# Patient Record
Sex: Female | Born: 1935 | Race: White | Hispanic: No | Marital: Married | Smoking: Never smoker
Health system: Southern US, Community
[De-identification: ages and names within clinical notes are randomized; demographics above are authoritative.]

## PROBLEM LIST (undated history)

## (undated) DIAGNOSIS — C801 Malignant (primary) neoplasm, unspecified: Secondary | ICD-10-CM

## (undated) HISTORY — DX: Malignant (primary) neoplasm, unspecified: C80.1

## (undated) HISTORY — PX: COLON SURGERY: SHX602

## (undated) HISTORY — PX: BACK SURGERY: SHX140

## (undated) HISTORY — PX: HERNIA REPAIR: SHX51

## (undated) HISTORY — PX: OTHER SURGICAL HISTORY: SHX169

## (undated) HISTORY — PX: ACHILLES TENDON REPAIR: SUR1153

---

## 1989-06-15 HISTORY — PX: BLADDER SURGERY: SHX569

## 1993-10-30 HISTORY — PX: KNEE SURGERY: SHX244

## 1997-09-12 ENCOUNTER — Other Ambulatory Visit: Admission: RE | Admit: 1997-09-12 | Discharge: 1997-09-12 | Payer: Self-pay | Admitting: Obstetrics and Gynecology

## 1997-10-03 ENCOUNTER — Ambulatory Visit (HOSPITAL_COMMUNITY): Admission: RE | Admit: 1997-10-03 | Discharge: 1997-10-03 | Payer: Self-pay | Admitting: Obstetrics and Gynecology

## 1998-10-31 ENCOUNTER — Ambulatory Visit (HOSPITAL_COMMUNITY): Admission: RE | Admit: 1998-10-31 | Discharge: 1998-10-31 | Payer: Self-pay | Admitting: Obstetrics and Gynecology

## 1998-10-31 ENCOUNTER — Encounter: Payer: Self-pay | Admitting: Obstetrics and Gynecology

## 1999-11-03 ENCOUNTER — Encounter: Payer: Self-pay | Admitting: Obstetrics and Gynecology

## 1999-11-03 ENCOUNTER — Ambulatory Visit (HOSPITAL_COMMUNITY): Admission: RE | Admit: 1999-11-03 | Discharge: 1999-11-03 | Payer: Self-pay | Admitting: Obstetrics and Gynecology

## 2000-11-04 ENCOUNTER — Other Ambulatory Visit: Admission: RE | Admit: 2000-11-04 | Discharge: 2000-11-04 | Payer: Self-pay | Admitting: Obstetrics and Gynecology

## 2000-11-04 ENCOUNTER — Ambulatory Visit (HOSPITAL_COMMUNITY): Admission: RE | Admit: 2000-11-04 | Discharge: 2000-11-04 | Payer: Self-pay | Admitting: Obstetrics and Gynecology

## 2000-11-04 ENCOUNTER — Encounter: Payer: Self-pay | Admitting: Obstetrics and Gynecology

## 2002-01-17 ENCOUNTER — Encounter: Payer: Self-pay | Admitting: Obstetrics and Gynecology

## 2002-01-17 ENCOUNTER — Encounter: Admission: RE | Admit: 2002-01-17 | Discharge: 2002-01-17 | Payer: Self-pay | Admitting: Orthopedic Surgery

## 2002-01-17 ENCOUNTER — Encounter: Payer: Self-pay | Admitting: Orthopedic Surgery

## 2002-01-17 ENCOUNTER — Ambulatory Visit (HOSPITAL_COMMUNITY): Admission: RE | Admit: 2002-01-17 | Discharge: 2002-01-17 | Payer: Self-pay | Admitting: Obstetrics and Gynecology

## 2002-01-18 ENCOUNTER — Ambulatory Visit (HOSPITAL_BASED_OUTPATIENT_CLINIC_OR_DEPARTMENT_OTHER): Admission: RE | Admit: 2002-01-18 | Discharge: 2002-01-19 | Payer: Self-pay | Admitting: Orthopedic Surgery

## 2002-03-06 ENCOUNTER — Encounter: Admission: RE | Admit: 2002-03-06 | Discharge: 2002-06-04 | Payer: Self-pay | Admitting: Orthopedic Surgery

## 2002-06-05 ENCOUNTER — Encounter: Admission: RE | Admit: 2002-06-05 | Discharge: 2002-06-08 | Payer: Self-pay | Admitting: Orthopedic Surgery

## 2002-07-18 ENCOUNTER — Encounter: Admission: RE | Admit: 2002-07-18 | Discharge: 2002-07-18 | Payer: Self-pay | Admitting: Obstetrics and Gynecology

## 2002-07-18 ENCOUNTER — Encounter: Payer: Self-pay | Admitting: Obstetrics and Gynecology

## 2003-01-16 ENCOUNTER — Encounter: Admission: RE | Admit: 2003-01-16 | Discharge: 2003-01-16 | Payer: Self-pay | Admitting: Orthopedic Surgery

## 2003-01-17 ENCOUNTER — Ambulatory Visit (HOSPITAL_BASED_OUTPATIENT_CLINIC_OR_DEPARTMENT_OTHER): Admission: RE | Admit: 2003-01-17 | Discharge: 2003-01-17 | Payer: Self-pay | Admitting: Orthopedic Surgery

## 2003-01-17 ENCOUNTER — Ambulatory Visit (HOSPITAL_COMMUNITY): Admission: RE | Admit: 2003-01-17 | Discharge: 2003-01-17 | Payer: Self-pay | Admitting: Orthopedic Surgery

## 2003-02-07 ENCOUNTER — Encounter: Admission: RE | Admit: 2003-02-07 | Discharge: 2003-03-29 | Payer: Self-pay | Admitting: Orthopedic Surgery

## 2003-02-07 ENCOUNTER — Ambulatory Visit (HOSPITAL_COMMUNITY): Admission: RE | Admit: 2003-02-07 | Discharge: 2003-02-07 | Payer: Self-pay | Admitting: Obstetrics and Gynecology

## 2003-02-21 ENCOUNTER — Ambulatory Visit (HOSPITAL_BASED_OUTPATIENT_CLINIC_OR_DEPARTMENT_OTHER): Admission: RE | Admit: 2003-02-21 | Discharge: 2003-02-21 | Payer: Self-pay | Admitting: Orthopedic Surgery

## 2004-02-11 ENCOUNTER — Ambulatory Visit (HOSPITAL_COMMUNITY): Admission: RE | Admit: 2004-02-11 | Discharge: 2004-02-11 | Payer: Self-pay | Admitting: Obstetrics and Gynecology

## 2005-02-11 ENCOUNTER — Ambulatory Visit (HOSPITAL_COMMUNITY): Admission: RE | Admit: 2005-02-11 | Discharge: 2005-02-11 | Payer: Self-pay | Admitting: Obstetrics and Gynecology

## 2005-07-21 ENCOUNTER — Encounter: Admission: RE | Admit: 2005-07-21 | Discharge: 2005-07-21 | Payer: Self-pay | Admitting: Obstetrics & Gynecology

## 2005-08-27 ENCOUNTER — Encounter: Admission: RE | Admit: 2005-08-27 | Discharge: 2005-08-27 | Payer: Self-pay | Admitting: Obstetrics & Gynecology

## 2005-09-08 ENCOUNTER — Encounter (INDEPENDENT_AMBULATORY_CARE_PROVIDER_SITE_OTHER): Payer: Self-pay | Admitting: Specialist

## 2005-09-08 ENCOUNTER — Ambulatory Visit (HOSPITAL_COMMUNITY): Admission: RE | Admit: 2005-09-08 | Discharge: 2005-09-08 | Payer: Self-pay | Admitting: Gastroenterology

## 2006-02-14 ENCOUNTER — Ambulatory Visit (HOSPITAL_COMMUNITY): Admission: RE | Admit: 2006-02-14 | Discharge: 2006-02-14 | Payer: Self-pay | Admitting: Obstetrics & Gynecology

## 2006-09-05 ENCOUNTER — Encounter: Admission: RE | Admit: 2006-09-05 | Discharge: 2006-09-05 | Payer: Self-pay | Admitting: Internal Medicine

## 2006-12-21 ENCOUNTER — Ambulatory Visit (HOSPITAL_COMMUNITY): Admission: RE | Admit: 2006-12-21 | Discharge: 2006-12-21 | Payer: Self-pay | Admitting: Internal Medicine

## 2006-12-21 ENCOUNTER — Ambulatory Visit: Payer: Self-pay | Admitting: Internal Medicine

## 2006-12-21 LAB — CONVERTED CEMR LAB
BUN: 13 mg/dL (ref 6–23)
Basophils Absolute: 0 10*3/uL (ref 0.0–0.1)
Creatinine, Ser: 0.6 mg/dL (ref 0.4–1.2)
GFR calc Af Amer: 123 mL/min
GFR calc non Af Amer: 102 mL/min
Hemoglobin: 14.9 g/dL (ref 12.0–15.0)
MCHC: 34.3 g/dL (ref 30.0–36.0)
Monocytes Absolute: 0.6 10*3/uL (ref 0.2–0.7)
Monocytes Relative: 7.3 % (ref 3.0–11.0)
Potassium: 4.2 meq/L (ref 3.5–5.1)
Pro B Natriuretic peptide (BNP): 791 pg/mL — ABNORMAL HIGH (ref 0.0–100.0)
RDW: 13.5 % (ref 11.5–14.6)

## 2006-12-22 ENCOUNTER — Ambulatory Visit: Payer: Self-pay

## 2006-12-28 ENCOUNTER — Ambulatory Visit: Payer: Self-pay | Admitting: Internal Medicine

## 2007-01-06 ENCOUNTER — Ambulatory Visit: Payer: Self-pay | Admitting: Internal Medicine

## 2007-01-30 ENCOUNTER — Observation Stay (HOSPITAL_COMMUNITY): Admission: RE | Admit: 2007-01-30 | Discharge: 2007-01-30 | Payer: Self-pay | Admitting: Interventional Cardiology

## 2007-02-22 DIAGNOSIS — J40 Bronchitis, not specified as acute or chronic: Secondary | ICD-10-CM | POA: Insufficient documentation

## 2007-02-22 DIAGNOSIS — G4730 Sleep apnea, unspecified: Secondary | ICD-10-CM | POA: Insufficient documentation

## 2007-02-22 DIAGNOSIS — R0602 Shortness of breath: Secondary | ICD-10-CM | POA: Insufficient documentation

## 2007-02-22 DIAGNOSIS — I82409 Acute embolism and thrombosis of unspecified deep veins of unspecified lower extremity: Secondary | ICD-10-CM | POA: Insufficient documentation

## 2007-02-23 ENCOUNTER — Ambulatory Visit: Payer: Self-pay | Admitting: Internal Medicine

## 2007-03-08 ENCOUNTER — Ambulatory Visit (HOSPITAL_COMMUNITY): Admission: RE | Admit: 2007-03-08 | Discharge: 2007-03-08 | Payer: Self-pay | Admitting: Obstetrics & Gynecology

## 2007-06-06 ENCOUNTER — Encounter: Admission: RE | Admit: 2007-06-06 | Discharge: 2007-07-21 | Payer: Self-pay | Admitting: Orthopaedic Surgery

## 2008-04-03 ENCOUNTER — Ambulatory Visit (HOSPITAL_COMMUNITY): Admission: RE | Admit: 2008-04-03 | Discharge: 2008-04-03 | Payer: Self-pay | Admitting: Obstetrics & Gynecology

## 2009-04-04 ENCOUNTER — Ambulatory Visit (HOSPITAL_COMMUNITY): Admission: RE | Admit: 2009-04-04 | Discharge: 2009-04-04 | Payer: Self-pay | Admitting: Obstetrics & Gynecology

## 2009-06-09 ENCOUNTER — Ambulatory Visit (HOSPITAL_COMMUNITY): Admission: RE | Admit: 2009-06-09 | Discharge: 2009-06-10 | Payer: Self-pay | Admitting: Surgery

## 2010-04-23 ENCOUNTER — Other Ambulatory Visit: Payer: Self-pay | Admitting: Obstetrics & Gynecology

## 2010-04-23 DIAGNOSIS — Z1231 Encounter for screening mammogram for malignant neoplasm of breast: Secondary | ICD-10-CM

## 2010-05-01 ENCOUNTER — Ambulatory Visit (HOSPITAL_COMMUNITY)
Admission: RE | Admit: 2010-05-01 | Discharge: 2010-05-01 | Disposition: A | Discharge status: Home / Self Care | Payer: Medicare Other | Source: Ambulatory Visit | Attending: Obstetrics & Gynecology | Admitting: Obstetrics & Gynecology

## 2010-05-01 DIAGNOSIS — Z1231 Encounter for screening mammogram for malignant neoplasm of breast: Secondary | ICD-10-CM | POA: Insufficient documentation

## 2010-05-05 LAB — CBC
HCT: 41.7 % (ref 36.0–46.0)
Hemoglobin: 14.3 g/dL (ref 12.0–15.0)
MCHC: 34.3 g/dL (ref 30.0–36.0)
MCV: 93.8 fL (ref 78.0–100.0)
Platelets: 194 10*3/uL (ref 150–400)
RDW: 14.2 % (ref 11.5–15.5)

## 2010-05-05 LAB — GLUCOSE, CAPILLARY
Glucose-Capillary: 108 mg/dL — ABNORMAL HIGH (ref 70–99)
Glucose-Capillary: 111 mg/dL — ABNORMAL HIGH (ref 70–99)
Glucose-Capillary: 118 mg/dL — ABNORMAL HIGH (ref 70–99)
Glucose-Capillary: 130 mg/dL — ABNORMAL HIGH (ref 70–99)

## 2010-05-05 LAB — BASIC METABOLIC PANEL
BUN: 21 mg/dL (ref 6–23)
GFR calc Af Amer: >60 mL/min (ref >60)
Glucose, Bld: 112 mg/dL — ABNORMAL HIGH (ref 70–99)

## 2010-06-26 ENCOUNTER — Other Ambulatory Visit: Payer: Self-pay | Admitting: Surgery

## 2010-06-30 NOTE — Assessment & Plan Note (Signed)
[facility]                             PULMONARY OFFICE NOTE   NAME:Garza, HAILEE HOLLICK                     MRN:          [id number]  DATE:12/28/2006                            DOB:          21-May-1922    PROBLEMS:  1. Dyspnea with history of deep vein thrombosis.  2. Sleep apnea.  3. Bronchitis.  4. History of deep vein thrombosis/Coumadin/left knee surgery.   HISTORY:  Immediate concern when she came for first visit previously was  that her dyspnea might be from pulmonary embolism. Fortunately, CT scan,  somewhat limited for small vessels, was negative for any central or low-  bar embolism, but it did show cardiac enlargement and suggested  pulmonary arterial hypertension. Her blood work was significant for a b-  naturetic peptide elevated at 791. Glucose was 150, BUN 13, creatinine  0.6, hemoglobin 14.9. I had shared the results with Dr. Kevan Ny and he was  going to follow up the cardiac status. She says that feels better, less  dyspneic. She mentions an occasional twinge in her left flank. She has  been put on Furosemide 20 mg and potassium 20 mEq, and she is pending an  echocardiogram. She says that she had been drinking Gatorade instead of  Coke, and I suggested that she drink water and get away from the sodium  loading. She mentions that she lost 7 pounds since last year. Note that  she has had flu vaccine and she had pneumococcal vaccine three years  ago.   OBJECTIVE:  Weight 241 pounds, blood pressure 110/90, pulse 108, room  air saturation 95%. She is alert, obese. Pulse is regular with frequent  extra systoles. I do not hear a rub. Breath sounds are quiet without  dullness or wheeze. There is no peripheral edema now.   IMPRESSION:  Dyspnea improved with diuresis. Main active problem was  probably borderline heart failure with fluid retention aggravated by her  Gatorade. Need to watch heart rhythm. She will continue BiPAP for her  sleep  apnea. Note also by telephone report, Dopplers of leg veins were  negative.   PLAN:  Drink water, not Gatorade. Schedule PFT. Keep follow up with Dr.  Kevan Ny for cardiac evaluation. Schedule return to me in two months and  then p.r.n. after we look at her PFT.     Clinton D. Maple Hudson, MD, Tonny Bollman, FACP  Electronically Signed    CDY/MedQ  DD: 01/01/2007  DT: 01/02/2007  Job #: [id number]   cc:   Candyce Churn, M.D.  Lum Keas, MD

## 2010-06-30 NOTE — Assessment & Plan Note (Signed)
[facility]                             PULMONARY OFFICE NOTE   NAME:[name], [name]                     MRN:          [id number]  DATE:12/21/2006                            DOB:          Aug 22, 1922    REFERRING PHYSICIAN:  M. Leda Quail, MD   PULMONARY CONSULTATION   PROBLEM:  Pulmonary consultation at the kind request of Dr. Hyacinth Meeker for  this 77 year old woman complaining of shortness of breath.   HISTORY:  For at least three or four weeks she has been aware of  increased shortness of breath without any sudden event.  Onset appears  to have been gradual and it is not clear that it is progressive.  There  has been no associated chest pain or palpitations.  Other than  occasional cough and sometimes choking a little if she is not careful  while eating, there has not been sustained wheeze, cough or phlegm.  She  was noted to be dyspneic when she went to her GYN visit last week and  was referred here.  Her primary physician, Dr. Kevan Ny, had apparently  raised question of asthma in the past.  She says she had a negative  work up for asthma three to four years ago in [place].  Dyspnea is  primarily noted with activity.  She has been using Bi-PAP at unknown  pressure for 5 or 6 years, originally prescribed in [place] for sleep  apnea.  She says she sleeps comfortably while she wears that although  there is some discomfort associated with mask fit.  Her left leg has  felt funny for the past two days.   MEDICATIONS:  1. Bi-PAP at night for sleep.  2. Effexor XR 75 mg.  3. Nasacort AQ.  4. Singulair.   ALLERGIES:  DRUG INTOLERANT TO CODEINE.   REVIEW OF SYSTEMS:  Exertional dyspnea.  Some productive cough is  indicated on the questionnaire but not in direct questioning.  Indigestion.  A 50 pound weight gain over the last 10 years.  Anxiety.  Joint stiffness.  Left lower leg cramps.  She does not recognize reflux.  She has had some nonspecific  nonexertional, non-meal related pains  across her back at the level of her shoulder blades occasionally in the  last two to three weeks.   PAST MEDICAL/SURGICAL HISTORY:  1. Sleep apnea.  2. Question of asthma 3 to 4 years ago.  3. Whooping cough at age 75 weeks.  4. Bronchitis.  5. ALLERGIC TO MAN[place] POLLEN in the spring when she lived in      [place] experiencing bronchitis with this in the springtime's.  6. Pneumonia at least once.  7. History of deep venous thrombosis after left knee surgery years      ago, treated with Coumadin.  She does not know that she had any      pulmonary emboli at that time.  8. Spine surgery in 2000.  9. Bilateral knee replacement.  10.Achilles surgery in 2003 at left ankle.  11.No history of bleeding disorder, heart disease or cancer.  Not  known to be diabetic.   SOCIAL HISTORY:  Never smoked.  Social alcohol.  Married with children.   FAMILY HISTORY:  Mother died of old age.  Father died in an accident.  A  brother died at 34 with colon cancer.  Sister died with a stroke at age  72.  Nobody known to have lung disease.   OBJECTIVE:  VITAL SIGNS:  Weight 248 pounds.  Blood pressure 116/62,  pulse 107, room air saturation 93%.  GENERAL APPEARANCE:  Pleasant, significantly overweight, elderly woman,  not in acute distress.  HEENT:  Nasal airway clear.  Nailbed's pink.  NECK:  No neck vein distention or stridor.  LUNG:  Lung fields quiet.  I could not appreciate dullness, rub, rales  or wheeze and she was not coughing or labored sitting in the exam room.  HEART:  Heart sounds were regular.  I did not hear murmur or gallop.  EXTREMITIES:  There was 1+ edema bilaterally below the knees without  palpable cords.  Homan's negative bilaterally.   IMPRESSION:  1. Dyspnea with history of deep venous thrombosis and left leg feels      funny.  The most urgent issue to exclude is pulmonary embolism.      We will attempt to rule that out tonight.  2.  I don't appreciate that there is a definite new pulmonary process      going on and would consider cardiac disease to be the most      important second issue in the differential that needs to be      evaluated.   PLAN:  1. CT scan of the chest to rule out pulmonary embolism.  2. Schedule Doppler vein evaluation, leg veins, bilaterally.  3. Blood for B-naturetic peptide, CBC, and BMET.  4. Schedule return in one week, earlier p.r.n.   ADDENDUM dated December 22, 2006.  As of 8:00 P.M. this evening, I have  called [name] to check on her status.  She says she feels the  same with nothing progressive.  Telephone report indicates the CT scan  of chest was negative for pulmonary embolism and she was told that her  Doppler leg vein evaluation was negative today.  Blood work is  significant for normal CBC with a hemoglobin of 14.9.  Basic chemistry  is significant for normal renal function with random glucose elevated at  150.  She is not aware of a history of diabetes.  B-naturetic peptide is  significantly elevated at 791.  Based on information available now, I  think we have ruled out pulmonary embolism.  Cardiac disease with BNP of  791 is probably an important part of her dyspnea.  The intermittent  pains between her shoulder blades might be angina and need to be  considered in that context, but are clinically stable.  I am going to  call her primary physician, Dr. Kevan Ny, in the morning to review the  issue with him and to suggest that he begin her on a diuretic and  consider cardiac function evaluation, perhaps to include echocardiogram  and ischemia studies if appropriate.  She has a pending appointment to  return with me next week and we will look at getting pulmonary functions  to complete the pulmonary evaluation component of her dyspnea complaint  at that time.     Clinton D. Maple Hudson, MD, Tonny Bollman, FACP  Electronically Signed    CDY/MedQ  DD: 12/22/2006  DT: 12/23/2006  Job #:  [id number]  cc:   M. Leda Quail, MD  Candyce Churn, M.D.

## 2010-06-30 NOTE — Cardiovascular Report (Signed)
NAMEPRECILLA, [name] NO.:  [id number]   MEDICAL RECORD NO.:  [id number]          PATIENT TYPE:  INP   LOCATION:  2899                         FACILITY:  [facility]   PHYSICIAN:  Corky Crafts, MDDATE OF BIRTH:  [birth year]   DATE OF PROCEDURE:  01/30/2007  DATE OF DISCHARGE:  01/30/2007                            CARDIAC CATHETERIZATION   PROCEDURE PERFORMED:  Left heart catheterization, left ventriculogram,  coronary angiogram, abdominal aortogram.   OPERATOR:  Dr. Eldridge Dace.   INDICATIONS:  Congestive heart failure, abnormal stress test.   PROCEDURAL NARRATIVE:  The risks and benefits of cardiac catheterization  were explained to the patient and informed consent was obtained.  The  patient was brought to the cath lab.  She was prepped and draped in the  usual sterile fashion.  Her right groin was infiltrated with 1%  lidocaine.  A 6-French arterial sheath was placed into the right femoral  artery using modified Seldinger technique.  Left coronary artery  angiography was performed using a JL4 pigtail catheter.  The catheter  was advanced to the left ostium under fluoroscopic guidance.  Digital  angiography was performed in multiple projections using hand injection  of contrast.  Right coronary artery angiography was then performed using  a JR4 pigtail catheter.  The catheter was advanced to the left ostium  under fluoroscopic guidance.  Digital angiography was performed in  multiple projections using hand injection of contrast.  A pigtail  catheter was advanced to the ascending aorta and across the aortic  valve.  The RAO projection was used for power injection of contrast to  image the left ventricle.  The catheter was pulled back under continuous  hemodynamic pressure monitoring.  The catheter was then pulled to the  abdominal aorta.  A power injection of contrast was performed in the AP  projection to visualize the infrarenal abdominal aorta.  The sheath  was  subsequently removed and an Angio-Seal was deployed for hemostasis.   FINDINGS:  The left main had significant bend in it, but it was widely  patent.  The left circumflex was a large left dominant vessel which  appeared angiographically normal.  There was a medium-sized OM1 which  was widely patent.  There is a large OM2 which was angiographically  normal.  The ramus vessel was a medium-sized vessel and widely patent.  The left anterior descending was a large wraparound vessel as it did  reach the apex.  It was angiographically normal.  There was a medium-  sized first diagonal which was also angiographically normal.  The right  coronary artery was a nondominant vessel which was widely patent.   HEMODYNAMIC RESULTS:  Left ventricular pressure 136/14 with an LVEDP of  25 mmHg.  Aortic pressure of 134/75 with a mean aortic pressure of 107  mmHg.  The left ventriculogram showed global left ventricular  dysfunction of severe degree and a calcified aortic valve.  The  abdominal aortogram showed no abdominal aortic aneurysm.  There is dual  arterial supply to the right kidney.  Both vessels were widely patent.  There was a single arterial  supply to the left kidney which appeared  widely patent as well.   IMPRESSION:  1. No significant coronary artery disease, nonischemic cardiomyopathy.  2. Moderately increased left ventricular end-diastolic pressure.  3. No renal artery stenosis.   RECOMMENDATIONS:  The patient to continue with aggressive medical  therapy for congestive heart failure including diuretics, ACE inhibitor  and beta blocker.  I would also consider digoxin.  I will follow up with  the patient in the office.      Corky Crafts, MD  Electronically Signed     JSV/MEDQ  D:  01/30/2007  T:  01/31/2007  Job:  [id number]

## 2010-07-01 ENCOUNTER — Ambulatory Visit
Admission: RE | Admit: 2010-07-01 | Discharge: 2010-07-01 | Disposition: A | Discharge status: Home / Self Care | Payer: Medicare Other | Source: Ambulatory Visit | Attending: Surgery | Admitting: Surgery

## 2010-07-01 MED ORDER — IOHEXOL 300 MG/ML  SOLN
125.0000 mL | Freq: Once | INTRAMUSCULAR | Status: AC | PRN
  Administered 2010-07-01: 125 mL via INTRAVENOUS

## 2010-07-03 NOTE — Op Note (Signed)
[name], [name]                          ACCOUNT NO.:  [id number]   MEDICAL RECORD NO.:  [id number]                   PATIENT TYPE:  AMB   LOCATION:  DSC                                  FACILITY:  [facility]   PHYSICIAN:  Loreta Ave, M.D.              DATE OF BIRTH:  14-Apr-1922   DATE OF PROCEDURE:  01/17/2003  DATE OF DISCHARGE:                                 OPERATIVE REPORT   PREOPERATIVE DIAGNOSES:  Left carpal tunnel syndrome.   POSTOPERATIVE DIAGNOSES:  Left carpal tunnel syndrome.   OPERATION PERFORMED:  Left carpal tunnel release.   SURGEON:  Loreta Ave, M.D.   ASSISTANT:  Arlys John D. Petrarca, P.A.-C.   ANESTHESIA:  IV regional.   SPECIMENS:  None.   CULTURES:  None.   COMPLICATIONS:  None.   DRESSING:  Soft compressive with bulky hand dressing and splint.   DESCRIPTION OF PROCEDURE:  The patient was brought to the operating room and  placed on the operating table in supine position.  After adequate anesthesia  had been obtained, the left hand was prepped and draped in the usual sterile  fashion.  A small curved incision along the thenar eminence heading slightly  ulnarward at the distal wrist crease.  Care taken to avoid palmar branch of  median nerve.  Skin and subcutaneous tissue divided.  Retinaculum over the  carpal tunnel was then incised under direct visualization from the forearm  fascia proximally to the palmar arch distally.  Carpal tunnel exposed.  Moderate to marked constriction of the nerve throughout the carpal canal.  Improved after carpal tunnel release and then epineurotomy protecting the  nerve.  Digital branches and motor branches identified and protected.  No  other abnormalities seen.  Wound irrigated.  Skin closed with mattress nylon  suture.  Margins of the wound injected with Marcaine.  Sterile compressive  dressing applied. Bulky hand dressing and splint applied.  Anesthesia  reversed.  Brought to recovery room.  Tolerated  surgery well without  complication.                                               Loreta Ave, M.D.    DFM/MEDQ  D:  01/17/2003  T:  01/18/2003  Job:  [id number]

## 2010-07-03 NOTE — Op Note (Signed)
[name], [name]              ACCOUNT NO.:  [id number]   MEDICAL RECORD NO.:  [id number]          PATIENT TYPE:  AMB   LOCATION:  ENDO                         FACILITY:  [facility]   PHYSICIAN:  Anselmo Rod, M.D.  DATE OF BIRTH:  [birth year]   DATE OF PROCEDURE:  09/08/2005  DATE OF DISCHARGE:                                 OPERATIVE REPORT   PRINCIPAL PROCEDURE PERFORMED:  Colonoscopy with snare polypectomy x1.   ENDOSCOPIST:  Anselmo Rod, M.D.   INSTRUMENT USED:  Olympus video colonoscope.   INDICATIONS FOR PROCEDURE:  An 77 year old white female with a family  history of colon cancer in her brother underwent a screening colonoscopy to  rule out colonic polyps, masses, etc.   PRE PROCEDURE PREPARATION:  Informed consent was obtained from the patient.  The patient fasted for 4 hours prior to the procedure and prepped with 32  Osmo prep during the night and in morning of the procedure.  Risks and  benefits of the procedure including a 10% risk of cancer and polyp were  discussed with the patient as well.   PRE PROCEDURE PHYSICAL:  VITAL SIGNS:  Patient has stable vital signs.  NECK: Supple.  CHEST: Clear to auscultation.  S1, S2, regular.  ABDOMEN: Soft with normal bowel sounds.   DESCRIPTION OF PROCEDURE:  The patient was placed in the left lateral  decubitus position sedated with 60 mcg of fentanyl and 5 mg of Versed in  slow incremental doses.  Once the patient was adequately sedate and  maintained on low flow oxgyen and continous cardiac monitoring, the olympus  video colonoscope was advanced from the rectum to the cecum with slight  diffculty. There was evidence of extensive sigmoid diverticulosis. A small  flat polyp was snared from the rectum with a hot snare. The transverse  colon, right colon, cecum and terminal ileum appeared normal. The patient  tolerated the procedure without any immediate complications.   IMPRESSIION:  1.Small flat polyp removed by hot  snare from the rectum.  2.Extensive sigmoid diverticulosis.  3.Normal appearing transverse colon, right colon, cecum and terminal ileum.   RECOMMENDATIONS:  1.Await pathology results.  2.Avoid all NSAIDS for the next 2 weeks.  3.Brochures on diverticulosis and a high fiber diet have been given to the  patient.  4.Repeat colonoscopy depending on pathology results.  5.Outpatient follow up as need arises in the future.   Dictation ended at this point.      Anselmo Rod, M.D.  Electronically Signed     JNM/MEDQ  D:  09/08/2005  T:  09/09/2005  Job:  [id number]   cc:   M. Leda Quail, MD   Candyce Churn, M.D.  Fax: [phone]

## 2010-07-03 NOTE — Op Note (Signed)
Breanna, Olsen                          ACCOUNT NO.:  [id number]   MEDICAL RECORD NO.:  [id number]                   PATIENT TYPE:  AMB   LOCATION:  DSC                                  FACILITY:  [facility]   PHYSICIAN:  Loreta Ave, M.D.              DATE OF BIRTH:  02-24-1922   DATE OF PROCEDURE:  02/21/2003  DATE OF DISCHARGE:                                 OPERATIVE REPORT   PREOPERATIVE DIAGNOSIS:  Right carpal tunnel syndrome.   POSTOPERATIVE DIAGNOSIS:  Right carpal tunnel syndrome.   PROCEDURE:  Right carpal tunnel release.   SURGEON:  Loreta Ave, M.D.   ASSISTANT:  Arlys John D. Petrarca, P.A.-C.   ANESTHESIA:  IV regional.   SPECIMENS:  None.   CULTURES:  None.   COMPLICATIONS:  None.   DRESSING:  Sterile compressive with bulky hand dressing and splint.   PROCEDURE:  The patient was brought to the operating room and placed on the  operative table in the supine position. After adequate anesthesia had been  obtained, the right arm was prepped and draped in the usual sterile fashion.  An incision over the carpal tunnel heading slightly ulnar-ward to the distal  wrist crease to avoid injury to the palmar branch of the median nerve.  The  skin and subcutaneous tissue was divided.  The retinaculum over the carpal  tunnel was identified and incised under direct visualization from the  forearm past proximally to the palmar digitally.  Moderate to marked  restriction on the nerve which improved after carpal tunnel release and  epineurotomy.  There were no other abnormalities.  The wound was irrigated.  The digital branch and motor branch were identified and protected.  The  wound was irrigated.  The skin was closed with nylon.  A sterile compressive  dressing was applied after Marcaine was injected in the margins of the  wound.  A bulky hand dressing and splint were applied.  Anesthesia was  reversed.  She was brought to the recovery room.  She tolerated the  surgery  well with no complications.                                               Loreta Ave, M.D.    DFM/MEDQ  D:  02/21/2003  T:  02/21/2003  Job:  [id number]

## 2010-07-03 NOTE — Op Note (Signed)
NAMENYX, KEADY NO.:  [id number]   MEDICAL RECORD NO.:  [id number]                   PATIENT TYPE:  OUT   LOCATION:  MAMO                                 FACILITY:  [facility]   PHYSICIAN:  Loreta Ave, M.D.              DATE OF BIRTH:  1923/01/11   DATE OF PROCEDURE:  01/18/2002  DATE OF DISCHARGE:                                 OPERATIVE REPORT   PREOPERATIVE DIAGNOSIS:  Chronic Achilles tendon tear, distal aspect, left  heel.  Symptomatic spurring and os calcis as well.   POSTOPERATIVE DIAGNOSIS:  Chronic Achilles tendon tear, distal aspect, left  heel.  Symptomatic spurring and os calcis as well with marked retraction of  distal Achilles tear.   OPERATION PERFORMED:  Exploration and debridement of Achilles tendon.  Repair and reconstruction utilizing local thickened tendon sheath for  interposition graft augmented with a bioabsorbable anchor at the os calcis.  Removal of spurring os calcis at attachment and posterior os calcis.   SURGEON:  Loreta Ave, M.D.   ASSISTANT:  Arlys John D. Petrarca, P.A.-C.   ANESTHESIA:  General.   ESTIMATED BLOOD LOSS:  Minimal.   TOURNIQUET TIME:  One hour.   SPECIMENS:  None.   CULTURES:  None.   COMPLICATIONS:  None.   DRESSING:  Soft compressive with short leg splint.   DESCRIPTION OF PROCEDURE:  The patient was brought to the operating room and  placed on the operating table in supine position.  After adequate anesthesia  had been obtained, a tourniquet was applied to the upper aspect of the left  leg.  Returned to a prone position with appropriate padding and support.  Prepped and draped in the usual sterile fashion.  Exsanguinated with  elevation and Esmarch.  Tourniquet inflated to 350 mmHg.  Longitudinal  incision just lateral to the Achilles tendon extending down to the lateral  border of the os calcis.  Skin and subcutaneous tissue divided.  She was  found to have a large almost 10  cm defect within the Achilles tendon sheath.  About 1 cm of attritional tendon still at the attachment.  Markedly  thickened tendon sheath throughout.  The end of the Achilles was found 10 cm  up  and retracted.  The proximal end of the Achilles was debrided back to  healthy tissue.  It was then well mobilized as proximal as possible to free  it up and bring it down as distal as possible.  Well captured with #2 fiber  wire suture.  Inflammatory tissue all removed.  The os calcis was exposed  through the defect and all spurs on the os calcis superior aspect as well as  to the attachment debrided with adequate excision with fluoroscopic  guidance.  Pre-Achilles bursa excised.  I then placed a bioabsorbable large  anchor in the os calcis with two #2 fiber wire sutures attached to the  anchor.  One of the sutures was then weaved into the remaining Achilles  tendon stump.  I used the very thickened tendon sheath as an interposition  graft as it was still attached distally.  This was formed into a tendon  bridging the 6 to 7 cm gap so that the sutures were then weaved in  reconstructing the distal tendon and coming out through the end.  Those  sutures were then tied end-to-end to the remaining tendon that had been  mobilized.  The other suture from the anchor was brought directly up into  the proximal tendon and then weaved into the tendon after the other ones  were tied.  This served to give me an attachment from the os calcis all the  way up into the remaining tendon and bridge the gap with the remaining  tendon and thickened tendon sheath as a graft.  Able to bring the knee to 90  degrees of flexion and the foot within 10 degrees of plantar grade position  after repair.  The remaining tendon sheath was then oversewn overtop of the  repair to try to protect this as much as possible.  Wound copiously  irrigated.  Closed with Vicryl and then staples.  Margins of the wound  injected with Marcaine.   Sterile compressive dressing and short leg splint  applied with the foot in a gravity plantar grade position.  Tourniquet  deflated.  The patient returned to supine position.  Anesthesia reversed.  Brought to recovery room.  Tolerated surgery well without complication.                                                Loreta Ave, M.D.    DFM/MEDQ  D:  01/18/2002  T:  01/18/2002  Job:  [id number]

## 2010-07-09 ENCOUNTER — Ambulatory Visit
Admission: RE | Admit: 2010-07-09 | Discharge: 2010-07-09 | Disposition: A | Discharge status: Home / Self Care | Payer: Medicare Other | Source: Ambulatory Visit | Attending: Surgery | Admitting: Surgery

## 2010-07-09 ENCOUNTER — Other Ambulatory Visit (INDEPENDENT_AMBULATORY_CARE_PROVIDER_SITE_OTHER): Payer: Self-pay | Admitting: Surgery

## 2010-07-09 DIAGNOSIS — R1900 Intra-abdominal and pelvic swelling, mass and lump, unspecified site: Secondary | ICD-10-CM

## 2010-07-09 MED ORDER — GADOBENATE DIMEGLUMINE 529 MG/ML IV SOLN
19.0000 mL | Freq: Once | INTRAVENOUS | Status: AC | PRN
  Administered 2010-07-09: 19 mL via INTRAVENOUS

## 2010-07-14 ENCOUNTER — Other Ambulatory Visit (INDEPENDENT_AMBULATORY_CARE_PROVIDER_SITE_OTHER): Payer: Self-pay | Admitting: Surgery

## 2010-07-14 DIAGNOSIS — R1900 Intra-abdominal and pelvic swelling, mass and lump, unspecified site: Secondary | ICD-10-CM

## 2010-07-15 ENCOUNTER — Ambulatory Visit
Admission: RE | Admit: 2010-07-15 | Discharge: 2010-07-15 | Disposition: A | Discharge status: Home / Self Care | Payer: Medicare Other | Source: Ambulatory Visit | Attending: Surgery | Admitting: Surgery

## 2010-07-15 DIAGNOSIS — R1900 Intra-abdominal and pelvic swelling, mass and lump, unspecified site: Secondary | ICD-10-CM

## 2010-07-15 MED ORDER — GADOBENATE DIMEGLUMINE 529 MG/ML IV SOLN
15.0000 mL | Freq: Once | INTRAVENOUS | Status: AC | PRN
  Administered 2010-07-15: 15 mL via INTRAVENOUS

## 2010-07-20 ENCOUNTER — Other Ambulatory Visit (INDEPENDENT_AMBULATORY_CARE_PROVIDER_SITE_OTHER): Payer: Self-pay | Admitting: Surgery

## 2010-07-20 DIAGNOSIS — R1903 Right lower quadrant abdominal swelling, mass and lump: Secondary | ICD-10-CM

## 2010-07-24 ENCOUNTER — Other Ambulatory Visit (INDEPENDENT_AMBULATORY_CARE_PROVIDER_SITE_OTHER): Payer: Self-pay | Admitting: Surgery

## 2010-07-24 ENCOUNTER — Ambulatory Visit (HOSPITAL_COMMUNITY)
Admission: RE | Admit: 2010-07-24 | Discharge: 2010-07-24 | Disposition: A | Discharge status: Home / Self Care | Payer: Medicare Other | Source: Ambulatory Visit | Attending: Surgery | Admitting: Surgery

## 2010-07-24 DIAGNOSIS — R1903 Right lower quadrant abdominal swelling, mass and lump: Secondary | ICD-10-CM | POA: Insufficient documentation

## 2010-07-24 DIAGNOSIS — Z538 Procedure and treatment not carried out for other reasons: Secondary | ICD-10-CM | POA: Insufficient documentation

## 2010-07-24 LAB — CBC
HCT: 40.4 % (ref 36.0–46.0)
Hemoglobin: 13.3 g/dL (ref 12.0–15.0)
MCV: 91.4 fL (ref 78.0–100.0)
WBC: 6.7 10*3/uL (ref 4.0–10.5)

## 2010-07-24 LAB — PROTIME-INR
INR: 0.89 (ref 0.00–1.49)
Prothrombin Time: 12.2 seconds (ref 11.6–15.2)

## 2010-07-24 LAB — GLUCOSE, CAPILLARY: Glucose-Capillary: 114 mg/dL — ABNORMAL HIGH (ref 70–99)

## 2010-07-30 ENCOUNTER — Ambulatory Visit (HOSPITAL_COMMUNITY): Payer: Medicare Other

## 2010-08-06 ENCOUNTER — Encounter (HOSPITAL_COMMUNITY)
Admission: RE | Admit: 2010-08-06 | Discharge: 2010-08-06 | Disposition: A | Discharge status: Home / Self Care | Payer: Medicare Other | Source: Ambulatory Visit | Attending: Surgery | Admitting: Surgery

## 2010-08-06 ENCOUNTER — Other Ambulatory Visit (INDEPENDENT_AMBULATORY_CARE_PROVIDER_SITE_OTHER): Payer: Self-pay | Admitting: Surgery

## 2010-08-06 DIAGNOSIS — R1900 Intra-abdominal and pelvic swelling, mass and lump, unspecified site: Secondary | ICD-10-CM

## 2010-08-06 LAB — BASIC METABOLIC PANEL
CO2: 35 mEq/L — ABNORMAL HIGH (ref 19–32)
Chloride: 99 mEq/L (ref 96–112)
GFR calc Af Amer: >60 mL/min (ref >60)
Potassium: 4.1 mEq/L (ref 3.5–5.1)
Sodium: 142 mEq/L (ref 135–145)

## 2010-08-06 LAB — CBC
HCT: 43.8 % (ref 36.0–46.0)
Hemoglobin: 14.7 g/dL (ref 12.0–15.0)
RBC: 4.74 MIL/uL (ref 3.87–5.11)

## 2010-08-06 LAB — SURGICAL PCR SCREEN
MRSA, PCR: NEGATIVE
Staphylococcus aureus: NEGATIVE

## 2010-08-06 LAB — PROTIME-INR
INR: 0.97 (ref 0.00–1.49)
Prothrombin Time: 13.1 seconds (ref 11.6–15.2)

## 2010-08-10 ENCOUNTER — Other Ambulatory Visit (INDEPENDENT_AMBULATORY_CARE_PROVIDER_SITE_OTHER): Payer: Self-pay | Admitting: Surgery

## 2010-08-10 ENCOUNTER — Ambulatory Visit (HOSPITAL_COMMUNITY)
Admission: RE | Admit: 2010-08-10 | Discharge: 2010-08-11 | Disposition: A | Discharge status: Home / Self Care | Payer: Medicare Other | Source: Ambulatory Visit | Attending: Surgery | Admitting: Surgery

## 2010-08-10 DIAGNOSIS — C482 Malignant neoplasm of peritoneum, unspecified: Secondary | ICD-10-CM

## 2010-08-10 DIAGNOSIS — Z01812 Encounter for preprocedural laboratory examination: Secondary | ICD-10-CM | POA: Insufficient documentation

## 2010-08-10 DIAGNOSIS — Z01810 Encounter for preprocedural cardiovascular examination: Secondary | ICD-10-CM | POA: Insufficient documentation

## 2010-08-10 DIAGNOSIS — Z01818 Encounter for other preprocedural examination: Secondary | ICD-10-CM | POA: Insufficient documentation

## 2010-08-10 DIAGNOSIS — C801 Malignant (primary) neoplasm, unspecified: Secondary | ICD-10-CM | POA: Insufficient documentation

## 2010-08-10 DIAGNOSIS — C786 Secondary malignant neoplasm of retroperitoneum and peritoneum: Principal | ICD-10-CM | POA: Insufficient documentation

## 2010-08-10 DIAGNOSIS — Z79899 Other long term (current) drug therapy: Secondary | ICD-10-CM | POA: Insufficient documentation

## 2010-08-10 LAB — GLUCOSE, CAPILLARY: Glucose-Capillary: 138 mg/dL — ABNORMAL HIGH (ref 70–99)

## 2010-08-10 LAB — TYPE AND SCREEN: ABO/RH(D): O NEG

## 2010-08-12 NOTE — Op Note (Signed)
NAMESYDELL, PROWELL NO.:  [id number]  MEDICAL RECORD NO.:  [id number]  LOCATION:  5121                         FACILITY:  [facility]  PHYSICIAN:  Abigail Miyamoto, M.D. DATE OF BIRTH:  06/09/1922  DATE OF PROCEDURE:  08/10/2010 DATE OF DISCHARGE:                              OPERATIVE REPORT   PREOPERATIVE DIAGNOSIS:  Intraabdominal mass.  POSTOPERATIVE DIAGNOSIS:  Intraabdominal carcinomatosis.  PROCEDURE:  Diagnostic laparoscopy with biopsy of peritoneal nodules.  SURGEON:  Abigail Miyamoto, MD  ASSISTANT:  Almond Lint, MD  ANESTHESIA:  General endotracheal anesthesia and 0.25% Marcaine.  ESTIMATED BLOOD LOSS:  Minimal.  INDICATIONS:  [name] is an 77 year old female who presented with abdominal pain.  A CAT scan suggested peritoneal nodules with 2 large nodule in the area of the appendix.  Her CEA level was normal.  Her CA- 125 was slightly elevated and there was not unsuccessful attempt to have a CT-guided biopsy of one of the peritoneal nodules was performed. Therefore, decision was made to proceed with diagnostic laparoscopy.  FINDINGS:  The patient was found to have intraabdominal carcinomatosis with multiple nodules throughout her peritoneal surface as well as large nodule on the diaphragm.  There were two large nodules in the right lower quadrant, one appeared to the fixed to the cecum in the area of the appendix, which could not be easily visualized.  Multiple biopsies were performed.  Frozen section did show carcinoma, etiology is uncertain.  PROCEDURE IN DETAIL:  The patient was brought to the operating room and identified as [name].  She was placed supine on the operative table and general anesthesia was induced.  Her abdomen was then prepped and draped in the usual sterile fashion.  Using a #15 blade, a small vertical incision was made in the patient's left flank.  This was carried down into the subcutaneous tissue with a  hemostat.  I then used the OptiVu and a 5-mm port to gain entrance to the peritoneal cavity under direct vision.  Insufflation of the abdomen was then begun.  I inserted the camera and inspected the area of insertion and saw no evidence of bowel injury.  The patient had multiple adhesions to the small bowel and omentum to her previously placed mesh.  Upon entering the abdomen, I was able to identify multiple nodules throughout the peritoneum and omentum consistent with carcinomatosis.  There was one large nodule on the dome of diaphragm as well.  A 5-mm port was placed in the patient's right lower quadrant and another in the left lower quadrant under direct vision.  I evaluate the cecum.  There was a large nodule at the base of the cecum.  It was difficult to tell whether this was the primary from the appendix or a secondary nodule.  There was another large nodule on the small bowel mesentery.  At this point, biopsy forceps were used to take several biopsies from the diaphragm as well as the peritoneal wall.  One of the large nodules on the small bowel mesentery was removed as well.  This was all done both bluntly and with cautery.  Frozen section of one of the nodules confirmed malignancy of uncertain etiology.  Once all spot biopsy specimens were obtained, I irrigated the abdomen with saline.  I again examined all areas of biopsy and saw no evidence of continued bleeding or bowel injury.  At this point, all ports were removed under direct vision and the abdomen was deflated.  All port sites were then anesthetized with Marcaine and closed with 4-0 Monocryl subcuticular sutures.  Steri-Strips and Band- Aids were then performed.  The patient tolerated the procedure well. All counts were correct at the end of the procedure.  The patient was then extubated in the operating room and taken in stable condition to recovery room.     Abigail Miyamoto, M.D.     DB/MEDQ  D:  08/10/2010  T:   08/10/2010  Job:  [id number]  Electronically Signed by Abigail Miyamoto M.D. on 08/12/2010 04:31:50 PM

## 2010-08-14 ENCOUNTER — Encounter (INDEPENDENT_AMBULATORY_CARE_PROVIDER_SITE_OTHER): Payer: Self-pay | Admitting: Surgery

## 2010-08-26 ENCOUNTER — Ambulatory Visit (INDEPENDENT_AMBULATORY_CARE_PROVIDER_SITE_OTHER): Payer: Medicare Other | Admitting: Surgery

## 2010-08-26 DIAGNOSIS — Z9889 Other specified postprocedural states: Secondary | ICD-10-CM

## 2010-08-26 DIAGNOSIS — C181 Malignant neoplasm of appendix: Secondary | ICD-10-CM

## 2010-08-26 NOTE — Progress Notes (Signed)
Subjective:     Patient ID: [name], female   DOB: 08/19/1922, 77 y.o.   MRN: [id number]    There were no vitals taken for this visit.    HPI She is here for her first postoperative visit status post diagnostic laparoscopy. Again, at the time of laparoscopy, I found metastatic disease. The biopsies were consistent with adenocarcinoma of probable appendiceal origin. She currently has no complaints and is doing fairly well. Review of Systems     Objective:   Physical Exam    On exam, her abdomen is soft and nontender. 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 Plan:       She will now be safely surgical oncologists at [facility] in [place] for further evaluation and recommendations. I will be seeing her back here as needed.

## 2011-04-22 ENCOUNTER — Other Ambulatory Visit: Payer: Self-pay | Admitting: Obstetrics & Gynecology

## 2011-04-22 DIAGNOSIS — Z1231 Encounter for screening mammogram for malignant neoplasm of breast: Secondary | ICD-10-CM

## 2011-05-17 ENCOUNTER — Ambulatory Visit (HOSPITAL_COMMUNITY)
Admission: RE | Admit: 2011-05-17 | Discharge: 2011-05-17 | Disposition: A | Discharge status: Home / Self Care | Payer: Medicare Other | Source: Ambulatory Visit | Attending: Obstetrics & Gynecology | Admitting: Obstetrics & Gynecology

## 2011-05-17 DIAGNOSIS — Z1231 Encounter for screening mammogram for malignant neoplasm of breast: Secondary | ICD-10-CM | POA: Insufficient documentation

## 2012-02-03 ENCOUNTER — Encounter (HOSPITAL_BASED_OUTPATIENT_CLINIC_OR_DEPARTMENT_OTHER): Payer: Medicare Other | Attending: Internal Medicine

## 2012-02-03 ENCOUNTER — Other Ambulatory Visit (HOSPITAL_BASED_OUTPATIENT_CLINIC_OR_DEPARTMENT_OTHER): Payer: Self-pay | Admitting: Internal Medicine

## 2012-02-03 ENCOUNTER — Ambulatory Visit (HOSPITAL_COMMUNITY)
Admission: RE | Admit: 2012-02-03 | Discharge: 2012-02-03 | Disposition: A | Discharge status: Home / Self Care | Payer: Medicare Other | Source: Ambulatory Visit | Attending: Internal Medicine | Admitting: Internal Medicine

## 2012-02-03 DIAGNOSIS — Z859 Personal history of malignant neoplasm, unspecified: Secondary | ICD-10-CM | POA: Insufficient documentation

## 2012-02-03 DIAGNOSIS — I10 Essential (primary) hypertension: Secondary | ICD-10-CM | POA: Insufficient documentation

## 2012-02-03 DIAGNOSIS — L97509 Non-pressure chronic ulcer of other part of unspecified foot with unspecified severity: Secondary | ICD-10-CM | POA: Insufficient documentation

## 2012-02-03 DIAGNOSIS — M869 Osteomyelitis, unspecified: Secondary | ICD-10-CM

## 2012-02-03 DIAGNOSIS — M79609 Pain in unspecified limb: Secondary | ICD-10-CM | POA: Insufficient documentation

## 2012-02-03 DIAGNOSIS — E119 Type 2 diabetes mellitus without complications: Secondary | ICD-10-CM | POA: Insufficient documentation

## 2012-02-03 DIAGNOSIS — X58XXXA Exposure to other specified factors, initial encounter: Secondary | ICD-10-CM | POA: Insufficient documentation

## 2012-02-03 DIAGNOSIS — Z96659 Presence of unspecified artificial knee joint: Secondary | ICD-10-CM | POA: Insufficient documentation

## 2012-02-03 DIAGNOSIS — F3289 Other specified depressive episodes: Secondary | ICD-10-CM | POA: Insufficient documentation

## 2012-02-03 DIAGNOSIS — E1169 Type 2 diabetes mellitus with other specified complication: Secondary | ICD-10-CM | POA: Insufficient documentation

## 2012-02-03 DIAGNOSIS — S91109A Unspecified open wound of unspecified toe(s) without damage to nail, initial encounter: Secondary | ICD-10-CM | POA: Insufficient documentation

## 2012-02-03 DIAGNOSIS — F329 Major depressive disorder, single episode, unspecified: Secondary | ICD-10-CM | POA: Insufficient documentation

## 2012-02-03 DIAGNOSIS — G4730 Sleep apnea, unspecified: Secondary | ICD-10-CM | POA: Insufficient documentation

## 2012-02-03 DIAGNOSIS — I739 Peripheral vascular disease, unspecified: Secondary | ICD-10-CM | POA: Insufficient documentation

## 2012-02-03 DIAGNOSIS — M21969 Unspecified acquired deformity of unspecified lower leg: Secondary | ICD-10-CM | POA: Insufficient documentation

## 2012-02-04 NOTE — Progress Notes (Signed)
[facility]  NAME:  [name], [name]              ACCOUNT NO.:  [id number]  MEDICAL RECORD NO.:  [id number]      DATE OF BIRTH:  10-22-1922  PHYSICIAN:  Maxwell Caul, M.D.      VISIT DATE:                                  OFFICE VISIT   Ms. [name] is a very pleasant 78 year old woman who arrived accompanied by her daughter, [name], for review of an ulcer on her left plantar toe. She is a diabetic.  Has not had any history of wounds; however, she does have forefoot deformity and that the second toe rides over the first toe.  She tells me today she is here for review of a wound that goes back 3 or 4 months.  She is not really clear of a precipitating factor. She has been cared for by her podiatrist, Dr. Marlowe Aschoff.  She was kindly referred here when the wound proved to be refractory in Dr. Faylene Million notes.  PAST MEDICAL HISTORY:  Includes, 1. Metastatic cancer.  At this point, I am not exactly clear of the     nature of this listed as being "stage IV." 2. Essential hypertension. 3. Depression. 4. Peritoneal carcinomatosis. 5. Type 2 diabetes. 6. Arthritis. 7. Constipation. 8. Peripheral vascular disease. 9. Sleep apnea using BiPAP at night.  PAST SURGICAL HISTORY:  Bladder suspension, vaginal hysterectomy, left knee replacement in 1995, right knee replacement in 1996, low back surgery, ruptured Achilles tendon in 2003 repaired, carpal tunnel surgery in 2004.  PHYSICAL EXAMINATION:  Her temperature is 98.1, pulse 87, respirations 18, blood pressure 121/68.  CBG 118.  The only concern here was a small area on her left great toe near the tip of the digit.  This measured 0.3 x 0.2 x 0.4.  There is a small opening initially did not probe to bone. There was no evidence of infection or purulence.  Nevertheless, there was circumferential callus.  The wound was debrided in a nonexcisional fashion.  Underneath this, the wound was properly identified.   Actually, the tissue here looked fairly good.  There is advancing epithelialization, the granulation tissue in the wound looked fairly healthy.  IMPRESSIONS:  Wagner grade 2 wound of the left great toe.  Given the duration of this, I will order a plain x-ray to rule out osteomyelitis. We used collagen (EndoForm).  Hydrogel with foam covering, covered with gauze and a toe sock.  The patient already had a healing sandal. Hopefully, we can resolve this in the not too distant future, this did not really look too ominous.          ______________________________ Maxwell Caul, M.D.     MGR/MEDQ  D:  02/03/2012  T:  02/04/2012  Job:  [id number]

## 2012-02-17 ENCOUNTER — Encounter (HOSPITAL_BASED_OUTPATIENT_CLINIC_OR_DEPARTMENT_OTHER): Payer: Medicare Other | Attending: Internal Medicine

## 2012-02-17 DIAGNOSIS — L97509 Non-pressure chronic ulcer of other part of unspecified foot with unspecified severity: Secondary | ICD-10-CM | POA: Insufficient documentation

## 2012-02-17 DIAGNOSIS — L84 Corns and callosities: Secondary | ICD-10-CM | POA: Insufficient documentation

## 2012-02-17 DIAGNOSIS — E1169 Type 2 diabetes mellitus with other specified complication: Secondary | ICD-10-CM | POA: Insufficient documentation

## 2012-03-02 ENCOUNTER — Encounter (HOSPITAL_BASED_OUTPATIENT_CLINIC_OR_DEPARTMENT_OTHER): Payer: Medicare Other

## 2012-04-03 ENCOUNTER — Telehealth: Payer: Self-pay | Admitting: *Deleted

## 2012-04-03 NOTE — Telephone Encounter (Signed)
Spoke with patient by phone and confirmed appointment with Dr. Truett Perna for 04/12/12.  Contact names and phone numbers were provided.

## 2012-04-12 ENCOUNTER — Ambulatory Visit: Payer: Medicare Other

## 2012-04-12 ENCOUNTER — Telehealth: Payer: Self-pay | Admitting: Oncology

## 2012-04-12 ENCOUNTER — Encounter: Payer: Self-pay | Admitting: Oncology

## 2012-04-12 ENCOUNTER — Ambulatory Visit (HOSPITAL_BASED_OUTPATIENT_CLINIC_OR_DEPARTMENT_OTHER): Payer: Medicare Other | Admitting: Oncology

## 2012-04-12 VITALS — BP 137/74 | HR 86 | Temp 98.0°F | Resp 20 | Ht 61.0 in | Wt 198.7 lb

## 2012-04-12 DIAGNOSIS — C181 Malignant neoplasm of appendix: Secondary | ICD-10-CM

## 2012-04-12 DIAGNOSIS — C786 Secondary malignant neoplasm of retroperitoneum and peritoneum: Secondary | ICD-10-CM

## 2012-04-12 DIAGNOSIS — C801 Malignant (primary) neoplasm, unspecified: Secondary | ICD-10-CM

## 2012-04-12 DIAGNOSIS — E119 Type 2 diabetes mellitus without complications: Secondary | ICD-10-CM

## 2012-04-12 DIAGNOSIS — R630 Anorexia: Secondary | ICD-10-CM

## 2012-04-12 NOTE — Telephone Encounter (Signed)
Gave pt appt for lab and MD on March 2014, chemo class tomorrow

## 2012-04-12 NOTE — Progress Notes (Signed)
Checked in new pt with no financial concerns. °

## 2012-04-12 NOTE — Progress Notes (Signed)
[facility] New Patient Consult   Referring ZO:XWRUEA [name] 79 y.o.  [birth year]    Reason for Referral: metastatic appendix carcinoma     HPI: [name] presented with abdominal painand a CT revealed peritoneal nodules. [name] underwent a diagnostic laparoscopy by Dr. Nena Alexander 08/10/2010 andinterim abdominal carcinomatosis was found. Multiple nodules were noted throughout the peritoneal surface and diaphragm. 2 large nodules were noted in the right lower quadrant, 1 appeared fixed to the cecum in the area of the appendix. Multiple biopsies were performedand confirmed adenocarcinoma with extracellular mucin. The histology favored and appendix primary.  [name] was referred for surgical oncology at [facility] and was felt to not be a candidate for debulking surgery/intraperitoneal therapy. [name] was treated with Xeloda between 09/26/2010 and 02/05/2011. The Xeloda was stopped secondary to fatigue. A CT revealed stable disease. Repeat imaging in August of 2013 showed progression of disease with an increase in the size of known nodules and development of pneumoperitoneum nodules. Xeloda was restarted. A CT on 12/14/2011 revealed stable disease. A restaging CT 03/14/2012 showed disease progression. Xeloda was discontinued.  The tumor was found to be K-ras wild-type. Dr. Molli Knock recommended treatment with vectibix. [name] requested a referral to receive treatment closer to home.   Past Medical History  Diagnosis Date  . Cataract   . Diabetes mellitus-type II   . 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    G4 P4  .    Hearing loss  .    Sleep apnea-nighttime BiPAP  Past Surgical History  Procedure Laterality Date  . Bladder surgery-suspension  06/1989  . Knee surgery-bilaterally replaced  10/30/1993      . Back surgery  20/11/1998    ruptured disc  . Colon surgery    . Hernia repair  07/07/2009   .    hysterectomy  .    Bilateral  cataract surgery  .   Colonoscopy in 2001 and 2007  Family history: One brother and one sister. Her brother had colon cancer. A maternal aunt had breast cancer. No other family history of cancer.  Current outpatient prescriptions:aspirin 81 MG tablet, Take 81 mg by mouth daily.  , Disp: , Rfl: ;  buPROPion (WELLBUTRIN SR) 100 MG 12 hr tablet, , Disp: , Rfl: ;  Calcium Carb-Vit D-Soy Isoflav (CALTRATE 600 + SOY PO), Take 600 mg by mouth 1 dose over 24 hours.  , Disp: , Rfl: ;  carvedilol (COREG) 3.125 MG tablet, Take 3.125 mg by mouth 2 (two) times daily with a meal.  , Disp: , Rfl:  furosemide (LASIX) 20 MG tablet, Take 20 mg by mouth 2 (two) times daily.  , Disp: , Rfl: ;  KLOR-CON M20 20 MEQ tablet, daily., Disp: , Rfl: ;  lisinopril (PRINIVIL,ZESTRIL) 5 MG tablet, Take 5 mg by mouth daily.  , Disp: , Rfl: ;  meloxicam (MOBIC) 15 MG tablet, , Disp: , Rfl: ;  metFORMIN (GLUCOPHAGE) 500 MG tablet, 2 (two) times daily., Disp: , Rfl: ;  methylcellulose packet, Take 2 each by mouth at bedtime., Disp: , Rfl:  multivitamin-iron-minerals-folic acid (CENTRUM) chewable tablet, Chew 1 tablet by mouth daily.  , Disp: , Rfl: ;  multivitamin-lutein (OCUVITE-LUTEIN) CAPS, Take 1 capsule by mouth daily., Disp: , Rfl: ;  ondansetron (ZOFRAN-ODT) 4 MG disintegrating tablet, as needed., Disp: , Rfl: ;  polyethylene glycol (MIRALAX / GLYCOLAX) packet, Take 17 g by mouth daily., Disp: , Rfl:  potassium chloride (  KLOR-CON) 20 MEQ packet, Take 20 mEq by mouth daily.  , Disp: , Rfl: ;  senna (SENOKOT) 8.6 MG tablet, Take 1 tablet by mouth as needed for constipation., Disp: , Rfl: ;  venlafaxine (EFFEXOR) 75 MG tablet, Take 75 mg by mouth 1 dose over 24 hours.  , Disp: , Rfl:   Allergies:  Allergies  Allergen Reactions  . Codeine     Social History: [name] lives with her husband and [place]. They have a 24-hour caretaker in the home. [name] does not use tobacco or alcohol. [name] has received a transfusion in the past. No risk  factor for HIV or hepatitis  ROS:   Positives include:anorexia, 5 pound weight loss, cough in the mornings-chronic and productive, dysphagia with pills, nausea in the mornings when [name] coughs, constipation while on treatment with Xeloda,rectal bleeding when [name] becomes constipated, intermittent abdominal pain-not taking pain medication, right greater than left shoulder pain secondary to arthritis, arthritis in both hands, skin thickening and desquamation over the hands while on Xeloda, left first toe ulcer treated at the wound center  A complete ROS was otherwise negative.  Physical Exam:  Blood pressure 137/74, pulse 86, temperature 98 F (36.7 C), temperature source Oral, resp. rate 20, height 5\' 1"  (1.549 m), weight 198 lb 11.2 oz (90.13 kg).  HEENT: oropharynx without visible mass, neck without mass Lungs: clear bilaterally Cardiac: regular rate and rhythm Abdomen: no hepatosplenomegaly, no apparent ascites, multiple abdominal masses with associated tenderness  Vascular: no leg edema Lymph nodes: no cervical, supraclavicular, axillary, or inguinal nodes Neurologic: alert and oriented, the motor exam appears grossly intact Skin: no rash Musculoskeletal: no spine tenderness   LAB:  CEA on 03/06/2012-5.1  Radiology:CTs of the chest, abdomen, and pelvis 03/14/2012-progressive peritoneal disease with an increased size and number of peritoneal implants and increased ascites. Minute nodular densities  In the periphery of the left lung and lingula consistent with inflammation.    Assessment/Plan:   1. Metastatic adenocarcinoma with abundant extracellular mucin-primary appendiceal carcinoma versus metastatic colorectal carcinoma diagnosed in June 2012 -Xeloda chemotherapy 09/26/2010 through 02/05/2011, discontinued secondary to fatigue -Xeloda resumed August 2013, restaging CT 03/06/2012 confirmed disease progression  2. Diabetes  3. Sleep apnea   Disposition:   [name] was  diagnosed with abdominal carcinomatosis in June 2012. The appendix is the most likely primary tumor site.  There has been disease progression following Xeloda chemotherapy. [name] discussed treatment options with Dr. Molli Knock. Treatment with panitumumab was recommended after the tumor was found to be K-ras wild-type. We discussed the potential toxicities associated with this EGFR inhibitor including the chance for diarrhea, an allergic reaction, and skin rash.  [name] understands no therapy will be curative. [name] has decided against further chemotherapy. [name] would like to attend a chemotherapy teaching class for further discussion of the toxicities associated with panitumumab.The tumor appears to be progressing in an indolent fashion and [name] has minimal symptoms related to the carcinomatosis at present.  [name] is weighing quality of life issue against the potential toxicities associated with this agent. [name] will return for an office visit in approximately one week for further discussion.  [name] 04/12/2012, 6:26 PM

## 2012-04-13 ENCOUNTER — Other Ambulatory Visit: Payer: Medicare Other

## 2012-04-13 ENCOUNTER — Encounter: Payer: Self-pay | Admitting: *Deleted

## 2012-04-20 ENCOUNTER — Telehealth: Payer: Self-pay | Admitting: Oncology

## 2012-04-20 ENCOUNTER — Ambulatory Visit (HOSPITAL_BASED_OUTPATIENT_CLINIC_OR_DEPARTMENT_OTHER): Payer: Medicare Other | Admitting: Oncology

## 2012-04-20 VITALS — BP 110/71 | HR 87 | Temp 96.8°F | Resp 18 | Ht 61.0 in | Wt 201.0 lb

## 2012-04-20 DIAGNOSIS — E119 Type 2 diabetes mellitus without complications: Secondary | ICD-10-CM

## 2012-04-20 DIAGNOSIS — C801 Malignant (primary) neoplasm, unspecified: Secondary | ICD-10-CM

## 2012-04-20 DIAGNOSIS — C786 Secondary malignant neoplasm of retroperitoneum and peritoneum: Secondary | ICD-10-CM

## 2012-04-20 DIAGNOSIS — C181 Malignant neoplasm of appendix: Secondary | ICD-10-CM

## 2012-04-20 DIAGNOSIS — C779 Secondary and unspecified malignant neoplasm of lymph node, unspecified: Secondary | ICD-10-CM

## 2012-04-20 MED ORDER — MINOCYCLINE HCL 100 MG PO CAPS: 100.0000 mg | ORAL_CAPSULE | Freq: Two times a day (BID) | ORAL | Status: DC

## 2012-04-20 NOTE — Progress Notes (Signed)
   [facility]    OFFICE PROGRESS NOTE   INTERVAL HISTORY:   She returns as scheduled. She attended a chemotherapy teaching class. No new complaint. She continues to have mild abdominal discomfort. She takes Mobic for arthritis pain. [name] has decided to proceed with panitumumab  Objective:  Vital signs in last 24 hours:  Blood pressure 110/71, pulse 87, temperature 96.8 F (36 C), temperature source Oral, resp. rate 18, height 5\' 1"  (1.549 m), weight 201 lb (91.173 kg).   Physical exam not performed today   Medications: I have reviewed the patient's current medications.  Assessment/Plan: 1. Metastatic adenocarcinoma with abundant extracellular mucin-primary appendiceal carcinoma versus metastatic colorectal carcinoma diagnosed in June 2012  -Xeloda chemotherapy 09/26/2010 through 02/05/2011, discontinued secondary to fatigue  -Xeloda resumed August 2013, restaging CT 03/06/2012 confirmed disease progression  2. Diabetes  3. Sleep apnea   Disposition:  We again reviewed the expected benefit and potential toxicities associated with panitumumab. She has attended a chemotherapy teaching class. She understands the chance of an allergic reaction, skin rash, and diarrhea. She would like to proceed with a trial of therapy. She will return for a first treatment on 04/26/2012. She will begin minocycline prophylaxis several days prior to beginning the panitumamab.  [name] will return for a second treatment and an office visit on 05/10/2012.   Thornton Papas, MD  04/20/2012  2:34 PM

## 2012-04-20 NOTE — Patient Instructions (Signed)
Begin minocycline twice daily beginning 04/24/12. Discontinue Calcium, Centrum and Occuvite due to drug interaction with minocycline.

## 2012-04-26 ENCOUNTER — Ambulatory Visit (HOSPITAL_BASED_OUTPATIENT_CLINIC_OR_DEPARTMENT_OTHER): Payer: Medicare Other

## 2012-04-26 ENCOUNTER — Other Ambulatory Visit: Payer: Self-pay | Admitting: Oncology

## 2012-04-26 VITALS — BP 126/75 | HR 97 | Temp 97.6°F

## 2012-04-26 DIAGNOSIS — Z5112 Encounter for antineoplastic immunotherapy: Secondary | ICD-10-CM

## 2012-04-26 DIAGNOSIS — C181 Malignant neoplasm of appendix: Secondary | ICD-10-CM

## 2012-04-26 MED ORDER — SODIUM CHLORIDE 0.9 % IV SOLN
6.0000 mg/kg | Freq: Once | INTRAVENOUS | Status: AC
  Administered 2012-04-26: 540 mg via INTRAVENOUS
  Filled 2012-04-26: qty 27

## 2012-04-26 MED ORDER — SODIUM CHLORIDE 0.9 % IV SOLN
Freq: Once | INTRAVENOUS | Status: AC
  Administered 2012-04-26: 13:00:00 via INTRAVENOUS

## 2012-04-26 NOTE — Patient Instructions (Addendum)
[facility] Discharge Instructions for Patients Receiving Chemotherapy  Today you received the following chemotherapy agents; Vectibix  BELOW ARE SYMPTOMS THAT SHOULD BE REPORTED IMMEDIATELY:  *FEVER GREATER THAN 100.5 F  *CHILLS WITH OR WITHOUT FEVER  NAUSEA AND VOMITING THAT IS NOT CONTROLLED WITH YOUR NAUSEA MEDICATION  *UNUSUAL SHORTNESS OF BREATH  *UNUSUAL BRUISING OR BLEEDING  TENDERNESS IN MOUTH AND THROAT WITH OR WITHOUT PRESENCE OF ULCERS  *URINARY PROBLEMS  *BOWEL PROBLEMS  UNUSUAL RASH Items with * indicate a potential emergency and should be followed up as soon as possible.  One of the nurses will contact you 24 hours after your treatment. Please let the nurse know about any problems that you may have experienced. Feel free to call the clinic you have any questions or concerns. The clinic phone number is [phone].   I have been informed and understand all the instructions given to me. I know to contact the clinic, my physician, or go to the Emergency Department if any problems should occur. I do not have any questions at this time, but understand that I may call the clinic during office hours   should I have any questions or need assistance in obtaining follow up care.    __________________________________________  _____________  __________ Signature of Patient or Authorized Representative            Date                   Time    __________________________________________ Nurse's Signature     Panitumumab Solution for Injection What is this medicine? PANITUMUMAB (pan i TOOM ue mab) is a chemotherapy drug. It targets a specific protein within cancer cells and stops the cells from growing. It is used to treat colorectal cancer. This medicine may be used for other purposes; ask your health care provider or pharmacist if you have questions. What should I tell my health care provider before I take this medicine? They need to know if you  have any of these conditions: -lung disease, especially lung fibrosis -an unusual or allergic reaction to panitumumab, mouse proteins, other medicines, foods, dyes, or preservatives -pregnant or trying to get pregnant -breast-feeding How should I use this medicine? This drug is given as an infusion into a vein. It is administered in a hospital or clinic by a specially trained health care professional. Talk to your pediatrician regarding the use of this medicine in children. Special care may be needed. Overdosage: If you think you have taken too much of this medicine contact a poison control center or emergency room at once. NOTE: This medicine is only for you. Do not share this medicine with others. What if I miss a dose? It is important not to miss your dose. Call your doctor or health care professional if you are unable to keep an appointment. What may interact with this medicine? -some medicines for cancer This list may not describe all possible interactions. Give your health care provider a list of all the medicines, herbs, non-prescription drugs, or dietary supplements you use. Also tell them if you smoke, drink alcohol, or use illegal drugs. Some items may interact with your medicine. What should I watch for while using this medicine? Visit your doctor for checks on your progress. This drug may make you feel generally unwell. This is not uncommon, as chemotherapy can affect healthy cells as well as cancer cells. Report any side effects. Continue your course of treatment even though you feel ill  unless your doctor tells you to stop. This medicine can make you more sensitive to the sun. Keep out of the sun while receiving this medicine and for 2 months after the last dose. If you cannot avoid being in the sun, wear protective clothing and use sunscreen. Do not use sun lamps or tanning beds/booths. In some cases, you may be given additional medicines to help with side effects. Follow all  directions for their use. Call your doctor or health care professional for advice if you get a fever, chills or sore throat, or other symptoms of a cold or flu. Do not treat yourself. This drug decreases your body's ability to fight infections. Try to avoid being around people who are sick. Avoid taking products that contain aspirin, acetaminophen, ibuprofen, naproxen, or ketoprofen unless instructed by your doctor. These medicines may hide a fever. Do not become pregnant while taking this medicine and for 6 months after the last dose. Women should inform their doctor if they wish to become pregnant or think they might be pregnant. Men should not father a child while taking this medicine and for 6 months after the last dose. There is a potential for serious side effects to an unborn child. Talk to your health care professional or pharmacist for more information. Do not breast-feed an infant while taking this medicine. What side effects may I notice from receiving this medicine? Side effects that you should report to your doctor or health care professional as soon as possible: -allergic reactions like skin rash, itching or hives, swelling of the face, lips, or tongue -breathing problems -changes in vision -fast, irregular heartbeat -feeling faint or lightheaded, falls -fever, chills -mouth sores -swelling of the ankles, feet, hands -unusually weak or tired Side effects that usually do not require medical attention (report to your doctor or health care professional if they continue or are bothersome): -changes in skin like acne, cracks, skin dryness -constipation -diarrhea -eyelash growth -headache -nail changes -nausea, vomiting -stomach upset This list may not describe all possible side effects. Call your doctor for medical advice about side effects. You may report side effects to FDA at 1-800-FDA-1088. Where should I keep my medicine? This drug is given in a hospital or clinic and will not  be stored at home. NOTE: This sheet is a summary. It may not cover all possible information. If you have questions about this medicine, talk to your doctor, pharmacist, or health care provider.  2012, [employer]. (09/08/2007 2:31:37 PM)

## 2012-04-27 ENCOUNTER — Telehealth: Payer: Self-pay | Admitting: *Deleted

## 2012-04-27 NOTE — Telephone Encounter (Signed)
Message copied by Augusto Garbe on Thu Apr 27, 2012 11:04 AM ------      Message from: Su Hilt C      Created: Wed Apr 26, 2012  3:46 PM      Regarding: chemo f/u call       1st time Vectibix on 04/26/12.  ------

## 2012-04-27 NOTE — Telephone Encounter (Signed)
Patient says she is feeling "about the same"  Denies any side effects at this time.  Reviewed doing good skin care and drinking lots of fluids for hydration.  Asked about use of miralax.  This nurse expressed that she may have diarrhea with this medicine and not need to take Miralax.  Patient is a Type II diabetic and checks her glucose every other day.  Was a little elevated this morning CBG - 130 and she normally is 100.  Instructed to monitor her food intake.  No further questions.  Will call office if any changes.

## 2012-05-07 ENCOUNTER — Other Ambulatory Visit: Payer: Self-pay | Admitting: Oncology

## 2012-05-10 ENCOUNTER — Telehealth: Payer: Self-pay | Admitting: Oncology

## 2012-05-10 ENCOUNTER — Other Ambulatory Visit (HOSPITAL_BASED_OUTPATIENT_CLINIC_OR_DEPARTMENT_OTHER): Payer: Medicare Other | Admitting: Lab

## 2012-05-10 ENCOUNTER — Ambulatory Visit (HOSPITAL_BASED_OUTPATIENT_CLINIC_OR_DEPARTMENT_OTHER): Payer: Medicare Other | Admitting: Oncology

## 2012-05-10 ENCOUNTER — Ambulatory Visit (HOSPITAL_BASED_OUTPATIENT_CLINIC_OR_DEPARTMENT_OTHER): Payer: Medicare Other

## 2012-05-10 ENCOUNTER — Telehealth: Payer: Self-pay | Admitting: *Deleted

## 2012-05-10 VITALS — BP 144/78 | HR 87 | Temp 96.7°F | Resp 20 | Ht 61.0 in | Wt 197.0 lb

## 2012-05-10 DIAGNOSIS — C181 Malignant neoplasm of appendix: Secondary | ICD-10-CM

## 2012-05-10 DIAGNOSIS — C786 Secondary malignant neoplasm of retroperitoneum and peritoneum: Secondary | ICD-10-CM

## 2012-05-10 DIAGNOSIS — C801 Malignant (primary) neoplasm, unspecified: Secondary | ICD-10-CM

## 2012-05-10 DIAGNOSIS — Z5112 Encounter for antineoplastic immunotherapy: Secondary | ICD-10-CM

## 2012-05-10 LAB — CBC WITH DIFFERENTIAL/PLATELET
BASO%: 0.9 % (ref 0.0–2.0)
LYMPH%: 13.2 % — ABNORMAL LOW (ref 14.0–49.7)
MCHC: 32.9 g/dL (ref 31.5–36.0)
MCV: 96.5 fL (ref 79.5–101.0)
MONO#: 0.8 10*3/uL (ref 0.1–0.9)
MONO%: 8.6 % (ref 0.0–14.0)
Platelets: 219 10*3/uL (ref 145–400)
RBC: 3.83 10*6/uL (ref 3.70–5.45)
RDW: 17 % — ABNORMAL HIGH (ref 11.2–14.5)
WBC: 9.4 10*3/uL (ref 3.9–10.3)

## 2012-05-10 LAB — COMPREHENSIVE METABOLIC PANEL (CC13)
ALT: 11 U/L (ref 0–55)
AST: 17 U/L (ref 5–34)
Creatinine: 0.7 mg/dL (ref 0.6–1.1)
Sodium: 139 mEq/L (ref 136–145)
Total Bilirubin: 0.31 mg/dL (ref 0.20–1.20)

## 2012-05-10 MED ORDER — SODIUM CHLORIDE 0.9 % IV SOLN
Freq: Once | INTRAVENOUS | Status: AC
  Administered 2012-05-10: 12:00:00 via INTRAVENOUS

## 2012-05-10 MED ORDER — SODIUM CHLORIDE 0.9 % IV SOLN
6.0000 mg/kg | Freq: Once | INTRAVENOUS | Status: AC
  Administered 2012-05-10: 540 mg via INTRAVENOUS
  Filled 2012-05-10: qty 27

## 2012-05-10 NOTE — Patient Instructions (Addendum)
[facility] Discharge Instructions for Patients Receiving Chemotherapy  Today you received the following chemotherapy agents vectibix  To help prevent nausea and vomiting after your treatment, we encourage you to take your nausea medication  and take it as often as prescribed   If you develop nausea and vomiting that is not controlled by your nausea medication, call the clinic. If it is after clinic hours your family physician or the after hours number for the clinic or go to the Emergency Department.   BELOW ARE SYMPTOMS THAT SHOULD BE REPORTED IMMEDIATELY:  *FEVER GREATER THAN 100.5 F  *CHILLS WITH OR WITHOUT FEVER  NAUSEA AND VOMITING THAT IS NOT CONTROLLED WITH YOUR NAUSEA MEDICATION  *UNUSUAL SHORTNESS OF BREATH  *UNUSUAL BRUISING OR BLEEDING  TENDERNESS IN MOUTH AND THROAT WITH OR WITHOUT PRESENCE OF ULCERS  *URINARY PROBLEMS  *BOWEL PROBLEMS  UNUSUAL RASH Items with * indicate a potential emergency and should be followed up as soon as possible.  One of the nurses will contact you 24 hours after your treatment. Please let the nurse know about any problems that you may have experienced. Feel free to call the clinic you have any questions or concerns. The clinic phone number is [phone].   I have been informed and understand all the instructions given to me. I know to contact the clinic, my physician, or go to the Emergency Department if any problems should occur. I do not have any questions at this time, but understand that I may call the clinic during office hours   should I have any questions or need assistance in obtaining follow up care.    __________________________________________  _____________  __________ Signature of Patient or Authorized Representative            Date                   Time    __________________________________________ Nurse's Signature

## 2012-05-10 NOTE — Telephone Encounter (Signed)
Per staff phone call and POF I have schedueld appts.  JMW  

## 2012-05-10 NOTE — Progress Notes (Signed)
   [facility]    OFFICE PROGRESS NOTE   INTERVAL HISTORY:   She returns as scheduled. She completed a first treatment with the panitumumab on 04/26/2012. No nausea, diarrhea, or skin rash. She has noted an improvement in the abdominal pain.  Objective:  Vital signs in last 24 hours:  Blood pressure 144/78, pulse 87, temperature 96.7 F (35.9 C), temperature source Oral, resp. rate 20, height 5\' 1"  (1.549 m), weight 197 lb (89.359 kg).    HEENT: no thrush or ulcer Resp: lungs clear bilaterally Cardio: regular rate and rhythm GI: multiple abdominal wall/abdominal masses are palpated including a mass superior to the umbilicus, no hepatomegaly Vascular: no leg edema  Skin:no rash     Lab Results:  Lab Results  Component Value Date   WBC 9.4 05/10/2012   HGB 12.2 05/10/2012   HCT 37.0 05/10/2012   MCV 96.5 05/10/2012   PLT 219 05/10/2012   ANC 6.6    Medications: I have reviewed the patient's current medications.  Assessment/Plan: 1. 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 Diabetes  3. Sleep apnea     Disposition:  She tolerated the first cycle of panitumumab without significant acute toxicity. She is taking minocycline. The plan is to proceed with cycle 2 today. She will return for an office visit in 2 weeks.   Thornton Papas, MD  05/10/2012  4:01 PM

## 2012-05-11 LAB — CEA: CEA: 10.4 ng/mL — ABNORMAL HIGH (ref 0.0–5.0)

## 2012-05-21 ENCOUNTER — Other Ambulatory Visit: Payer: Self-pay | Admitting: Oncology

## 2012-05-24 ENCOUNTER — Ambulatory Visit (HOSPITAL_BASED_OUTPATIENT_CLINIC_OR_DEPARTMENT_OTHER): Payer: Medicare Other

## 2012-05-24 ENCOUNTER — Ambulatory Visit (HOSPITAL_BASED_OUTPATIENT_CLINIC_OR_DEPARTMENT_OTHER): Payer: Medicare Other | Admitting: Oncology

## 2012-05-24 ENCOUNTER — Other Ambulatory Visit (HOSPITAL_BASED_OUTPATIENT_CLINIC_OR_DEPARTMENT_OTHER): Payer: Medicare Other | Admitting: Lab

## 2012-05-24 VITALS — BP 125/69 | HR 88 | Temp 97.6°F | Resp 20 | Ht 61.0 in | Wt 200.5 lb

## 2012-05-24 DIAGNOSIS — C786 Secondary malignant neoplasm of retroperitoneum and peritoneum: Secondary | ICD-10-CM

## 2012-05-24 DIAGNOSIS — C801 Malignant (primary) neoplasm, unspecified: Secondary | ICD-10-CM

## 2012-05-24 DIAGNOSIS — Z5112 Encounter for antineoplastic immunotherapy: Secondary | ICD-10-CM

## 2012-05-24 DIAGNOSIS — E119 Type 2 diabetes mellitus without complications: Secondary | ICD-10-CM

## 2012-05-24 DIAGNOSIS — C181 Malignant neoplasm of appendix: Secondary | ICD-10-CM

## 2012-05-24 DIAGNOSIS — F329 Major depressive disorder, single episode, unspecified: Secondary | ICD-10-CM

## 2012-05-24 LAB — MAGNESIUM (CC13): Magnesium: 1.6 mg/dl (ref 1.5–2.5)

## 2012-05-24 LAB — COMPREHENSIVE METABOLIC PANEL (CC13)
AST: 17 U/L (ref 5–34)
BUN: 24 mg/dL (ref 7.0–26.0)
Calcium: 8.5 mg/dL (ref 8.4–10.4)
Chloride: 104 mEq/L (ref 98–107)
Creatinine: 0.7 mg/dL (ref 0.6–1.1)
Total Bilirubin: 0.39 mg/dL (ref 0.20–1.20)

## 2012-05-24 LAB — CBC WITH DIFFERENTIAL/PLATELET
Basophils Absolute: 0.1 10*3/uL (ref 0.0–0.1)
EOS%: 6.5 % (ref 0.0–7.0)
HCT: 36.4 % (ref 34.8–46.6)
HGB: 11.9 g/dL (ref 11.6–15.9)
LYMPH%: 10.2 % — ABNORMAL LOW (ref 14.0–49.7)
MCH: 31.2 pg (ref 25.1–34.0)
MCV: 95.5 fL (ref 79.5–101.0)
MONO%: 9.1 % (ref 0.0–14.0)
NEUT%: 73.4 % (ref 38.4–76.8)
Platelets: 258 10*3/uL (ref 145–400)
lymph#: 1.1 10*3/uL (ref 0.9–3.3)

## 2012-05-24 MED ORDER — SODIUM CHLORIDE 0.9 % IV SOLN
Freq: Once | INTRAVENOUS | Status: AC
  Administered 2012-05-24: 12:00:00 via INTRAVENOUS

## 2012-05-24 MED ORDER — SODIUM CHLORIDE 0.9 % IV SOLN
6.0000 mg/kg | Freq: Once | INTRAVENOUS | Status: AC
  Administered 2012-05-24: 540 mg via INTRAVENOUS
  Filled 2012-05-24: qty 27

## 2012-05-24 NOTE — Patient Instructions (Addendum)
[facility] Discharge Instructions for Patients Receiving Chemotherapy  Today you received the following chemotherapy agents :  Vectibix.  To help prevent nausea and vomiting after your treatment, we encourage you to take your nausea medication as instructed by your physician.    If you develop nausea and vomiting that is not controlled by your nausea medication, call the clinic. If it is after clinic hours your family physician or the after hours number for the clinic or go to the Emergency Department.   BELOW ARE SYMPTOMS THAT SHOULD BE REPORTED IMMEDIATELY:  *FEVER GREATER THAN 100.5 F  *CHILLS WITH OR WITHOUT FEVER  NAUSEA AND VOMITING THAT IS NOT CONTROLLED WITH YOUR NAUSEA MEDICATION  *UNUSUAL SHORTNESS OF BREATH  *UNUSUAL BRUISING OR BLEEDING  TENDERNESS IN MOUTH AND THROAT WITH OR WITHOUT PRESENCE OF ULCERS  *URINARY PROBLEMS  *BOWEL PROBLEMS  UNUSUAL RASH Items with * indicate a potential emergency and should be followed up as soon as possible.  One of the nurses will contact you 24 hours after your treatment. Please let the nurse know about any problems that you may have experienced. Feel free to call the clinic you have any questions or concerns. The clinic phone number is [phone].   I have been informed and understand all the instructions given to me. I know to contact the clinic, my physician, or go to the Emergency Department if any problems should occur. I do not have any questions at this time, but understand that I may call the clinic during office hours   should I have any questions or need assistance in obtaining follow up care.    __________________________________________  _____________  __________ Signature of Patient or Authorized Representative            Date                   Time    __________________________________________ Nurse's Signature

## 2012-05-24 NOTE — Progress Notes (Signed)
   [facility]    OFFICE PROGRESS NOTE   INTERVAL HISTORY:   She returns as scheduled. She completed a second treatment on 05/10/2012.  She has dryness at the hands and cracking of the skin at the nostrils. No diarrhea. She had an episode of abdominal pain last night. No consistent pain.  Ms. Ramaswamy and her daughter report she has been depressed. She feels well physically, but is not interested in her usual activities.  Objective:  Vital signs in last 24 hours:  Blood pressure 125/69, pulse 88, temperature 97.6 F (36.4 C), temperature source Oral, resp. rate 20, height 5\' 1"  (1.549 m), weight 200 lb 8 oz (90.946 kg).    HEENT: no thrush or ulcers Resp: lungs clear bilaterally Cardio: regular rate and rhythm GI: mildly distended, palpable masses in the left lateral abdomen and superior to the umbilicus. Vascular: trace pitting edema at the low leg bilaterally  Skin:a few areas of superficial ulceration at the bilateral nostril, dryness at the dorsum of the hands bilaterally, 1 linear ulceration at a finger. No significant rash over the trunk. Mild erythema surrounding the umbilicus with a small amount of. The material deep in the umbilicus   Lab Results:  Lab Results  Component Value Date   WBC 10.4* 05/24/2012   HGB 11.9 05/24/2012   HCT 36.4 05/24/2012   MCV 95.5 05/24/2012   PLT 258 05/24/2012  ANC 7.6    Medications: I have reviewed the patient's current medications.  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 Diabetes  3. Sleep apnea 4. Depression  Disposition:  She has completed 2 treatments with  panitumumab. She appears to be tolerating the treatment well a side from mild skin toxicity. The mild erythema at the umbilicus may be related to treatment or a yeast infection. The patient and her daughter report the erythema has improved. They will contact us for increased erythema in this area. She continues minocycline.  Ms. Pagett woman with the cancer Center social worker today. She is interested in attending the GI support group. She will return for an office visit and panitumumab as scheduled in 2 weeks.   Thornton Papas, MD  05/24/2012  1:57 PM

## 2012-05-24 NOTE — Progress Notes (Signed)
Met with patient and her daughter to assess for needs.  The patient is asking for counseling regarding her disease and the constraints that come with it.  Abigail in SW was notified and will see the patient today during infusion.  Information was provided to the patient and her daughter regarding the GI and Caregiver support groups.  The next meeting is 06/05/12 and they both will plan to attend.  The patient and her daughter denied any further needs or requests at this time.  Will continue to follow as needed.

## 2012-06-04 ENCOUNTER — Other Ambulatory Visit: Payer: Self-pay | Admitting: Oncology

## 2012-06-05 ENCOUNTER — Telehealth: Payer: Self-pay | Admitting: Oncology

## 2012-06-05 ENCOUNTER — Telehealth: Payer: Self-pay | Admitting: *Deleted

## 2012-06-05 ENCOUNTER — Other Ambulatory Visit: Payer: Self-pay | Admitting: *Deleted

## 2012-06-05 NOTE — Telephone Encounter (Signed)
@   1005-Left VM that legs are swollen from knees down.

## 2012-06-05 NOTE — Telephone Encounter (Signed)
Patient reports slow increase in swelling-bilateral pitting edema in legs knees down. She can't tie her shoes. Reports edema is "significantly" more than on last office visit. Also reports today she has noted redness in there left calf > right calf. Does not feel hot to touch. Has been in recliner chair more with feet elevated. Edema does not respond to elevation. Nurse questioned her about her respiratory status and she reports being more short winded and has been coughing more.

## 2012-06-05 NOTE — Telephone Encounter (Signed)
s.w. pt and advised on 4.23.14 appt.Marland KitchenMarland KitchenMarland KitchenMarland Kitchenpt ok and aware

## 2012-06-06 ENCOUNTER — Telehealth: Payer: Self-pay | Admitting: Oncology

## 2012-06-06 ENCOUNTER — Telehealth: Payer: Self-pay | Admitting: *Deleted

## 2012-06-06 ENCOUNTER — Ambulatory Visit (HOSPITAL_BASED_OUTPATIENT_CLINIC_OR_DEPARTMENT_OTHER): Payer: Medicare Other | Admitting: Oncology

## 2012-06-06 VITALS — BP 148/77 | HR 86 | Temp 96.7°F | Resp 18 | Ht 61.0 in | Wt 204.4 lb

## 2012-06-06 DIAGNOSIS — C786 Secondary malignant neoplasm of retroperitoneum and peritoneum: Secondary | ICD-10-CM

## 2012-06-06 DIAGNOSIS — C181 Malignant neoplasm of appendix: Secondary | ICD-10-CM

## 2012-06-06 DIAGNOSIS — C801 Malignant (primary) neoplasm, unspecified: Secondary | ICD-10-CM

## 2012-06-06 DIAGNOSIS — R109 Unspecified abdominal pain: Secondary | ICD-10-CM

## 2012-06-06 DIAGNOSIS — R609 Edema, unspecified: Secondary | ICD-10-CM

## 2012-06-06 MED ORDER — NYSTATIN 100000 UNIT/GM EX POWD: Freq: Two times a day (BID) | CUTANEOUS | Status: DC

## 2012-06-06 MED ORDER — TRAMADOL HCL 50 MG PO TABS
25.0000 mg | ORAL_TABLET | Freq: Four times a day (QID) | ORAL | Status: DC | PRN
Start: 2012-06-06 — End: 2012-08-04

## 2012-06-06 NOTE — Telephone Encounter (Signed)
S/w pt's caregiver re appt for 5/7. Per BS cx'd appts for 4/23.

## 2012-06-06 NOTE — Progress Notes (Signed)
   [facility]    OFFICE PROGRESS NOTE   INTERVAL HISTORY:   She returns prior to a scheduled visit. She completed another treatment with panitumumab on 05/24/2012. Ms. Theron Arista complains of increased leg edema bilaterally. She continues to have intermittent abdominal pain. She has noted chest "congestion "this week. There is dry cracking of the skin at the dorsum of the feet, lips, and face.  Objective:  Vital signs in last 24 hours:  Blood pressure 148/77, pulse 86, temperature 96.7 F (35.9 C), temperature source Oral, resp. rate 18, height 5\' 1"  (1.549 m), weight 204 lb 6.4 oz (92.715 kg).    HEENT: No thrush or ulcers Resp: Scattered mild expiratory wheeze. Good air movement bilaterally, no respiratory distress Cardio: Regular rate and rhythm GI: Mildly distended, multiple abdominal wall mass is noted including a mass at the right subcostal region, a mass superior to the umbilicus, right lower quadrant mass, and a mass at the left lateral abdomen Vascular: Pitting edema at the lower leg bilaterally. Pink discoloration at the left greater than right lobe pretibial area. No upper leg edema.  Skin:  Dry rash of the face , superficial linear rash over the dorsum of the feet with paronychia at the nail beds of several toes  Lab Results:  Lab Results  Component Value Date   WBC 10.4* 05/24/2012   HGB 11.9 05/24/2012   HCT 36.4 05/24/2012   MCV 95.5 05/24/2012   PLT 258 05/24/2012   Medications: I have reviewed the patient's current medications.  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 Diabetes  3. Sleep apnea  4.  Depression  5. Skin rash secondary to panitumumab 6. Pitting edema of the lower legs-likely related to abdominal carcinomatosis and hypoalbuminemia 7. Abdominal pain secondary to metastatic appendiceal carcinoma-progressive    Disposition:  She has completed 3 treatments with panitumumab. She has developed a mild skin rash, but has otherwise tolerated the treatment well. I suspect the abdominal pain and leg edema are related to progression of the metastatic appendiceal carcinoma. I discussed the current situation with Ms. singleterry and her granddaughter. We decided to discontinue systemic therapy.  She will try leg elevation for the edema. She will also try support stockings. I have a low clinical suspicion for a deep vein thrombosis. She will begin a trial of tramadol for pain.  Ms. Coleson will return for an office visit in 2 weeks. She will contact us in the interim as needed.   Thornton Papas, MD  06/06/2012  3:21 PM

## 2012-06-06 NOTE — Telephone Encounter (Signed)
Call from pt asking if she should continue Minocycline since chemo has been discontinued. Reviewed with Dr. Truett Perna: Continue Minocycline for now. Pt voiced understanding. Next appointment confirmed.

## 2012-06-07 ENCOUNTER — Other Ambulatory Visit: Payer: Medicare Other | Admitting: Lab

## 2012-06-07 ENCOUNTER — Ambulatory Visit: Payer: Medicare Other

## 2012-06-07 ENCOUNTER — Ambulatory Visit: Payer: Medicare Other | Admitting: Oncology

## 2012-06-21 ENCOUNTER — Ambulatory Visit: Payer: Self-pay | Admitting: Obstetrics & Gynecology

## 2012-06-21 ENCOUNTER — Telehealth: Payer: Self-pay | Admitting: Oncology

## 2012-06-21 ENCOUNTER — Ambulatory Visit (HOSPITAL_COMMUNITY)
Admission: RE | Admit: 2012-06-21 | Discharge: 2012-06-21 | Disposition: A | Discharge status: Home / Self Care | Payer: Medicare Other | Source: Ambulatory Visit | Attending: Nurse Practitioner | Admitting: Nurse Practitioner

## 2012-06-21 ENCOUNTER — Ambulatory Visit (HOSPITAL_BASED_OUTPATIENT_CLINIC_OR_DEPARTMENT_OTHER): Payer: Medicare Other | Admitting: Nurse Practitioner

## 2012-06-21 VITALS — BP 160/85 | HR 98 | Temp 97.1°F | Resp 22 | Ht 61.0 in | Wt 209.2 lb

## 2012-06-21 DIAGNOSIS — C181 Malignant neoplasm of appendix: Secondary | ICD-10-CM

## 2012-06-21 DIAGNOSIS — C786 Secondary malignant neoplasm of retroperitoneum and peritoneum: Secondary | ICD-10-CM

## 2012-06-21 DIAGNOSIS — I059 Rheumatic mitral valve disease, unspecified: Secondary | ICD-10-CM | POA: Insufficient documentation

## 2012-06-21 DIAGNOSIS — R0602 Shortness of breath: Secondary | ICD-10-CM

## 2012-06-21 DIAGNOSIS — C801 Malignant (primary) neoplasm, unspecified: Secondary | ICD-10-CM

## 2012-06-21 DIAGNOSIS — R609 Edema, unspecified: Secondary | ICD-10-CM

## 2012-06-21 DIAGNOSIS — G893 Neoplasm related pain (acute) (chronic): Secondary | ICD-10-CM

## 2012-06-21 NOTE — Addendum Note (Signed)
Addended by: Wandalee Ferdinand on: 06/21/2012 05:16 PM   Modules accepted: Orders, Medications

## 2012-06-21 NOTE — Progress Notes (Signed)
OFFICE PROGRESS NOTE  Interval history:  [name] returns as scheduled. She continues to note dry skin especially on the hands. She has intermittent abdominal pain. She takes tramadol as needed. She typically does not require tramadol on a daily basis. No nausea. She gags when she brushes her teeth or coughs excessively. She has had increased shortness of breath over the past 2-3 weeks as well as a "congested cough". No fever. She continues to note increased leg swelling. No leg swelling worsens with prolonged standing. She has had recent constipation. She takes a laxative as needed.   Objective: Blood pressure 160/85, pulse 98, temperature 97.1 F (36.2 C), temperature source Oral, resp. rate 22, height 5\' 1"  (1.549 m), weight 209 lb 3.2 oz (94.892 kg).  No thrush or ulcerations. Dry skin on the face. Breath sounds diminished at the bases. Rales at the bases. Expiratory wheezes anteriorly. Regular cardiac rhythm. Abdomen appears distended. Multiple, wall masses. Pitting edema at the lower legs bilaterally with faint discoloration of the left greater than right low pretibial region. Hands with scattered areas of dry desquamation.  Lab Results: Lab Results  Component Value Date   WBC 10.4* 05/24/2012   HGB 11.9 05/24/2012   HCT 36.4 05/24/2012   MCV 95.5 05/24/2012   PLT 258 05/24/2012    Chemistry:    Chemistry      Component Value Date/Time   NA 139 05/24/2012 0939   NA 142 08/06/2010 1501   K 4.2 05/24/2012 0939   K 4.1 08/06/2010 1501   CL 104 05/24/2012 0939   CL 99 08/06/2010 1501   CO2 25 05/24/2012 0939   CO2 35* 08/06/2010 1501   BUN 24.0 05/24/2012 0939   BUN 22 08/06/2010 1501   CREATININE 0.7 05/24/2012 0939   CREATININE 0.62 08/06/2010 1501      Component Value Date/Time   CALCIUM 8.5 05/24/2012 0939   CALCIUM 9.5 08/06/2010 1501   ALKPHOS 115 05/24/2012 0939   AST 17 05/24/2012 0939   ALT 12 05/24/2012 0939   BILITOT 0.39 05/24/2012 0939       Studies/Results: No results  found.  Medications: I have reviewed the patient's current medications.  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  -Xeloda resumed August 2013, restaging CT 03/06/2012 confirmed disease progression  -initiation of every 2 week panitumumab 04/26/2012.  2. Diabetes  3. Sleep apnea  4. Depression  5. Skin rash secondary to panitumumab. Improved.  6. Pitting edema of the lower legs-likely related to abdominal carcinomatosis and hypoalbuminemia.  7. Abdominal pain secondary to metastatic appendiceal carcinoma-stable. She will continue Ultram as needed. 8. 2-3 week history of dyspnea. We will obtain a chest x-ray today.  Disposition-[name] performance status is slowly declining. Dr. Truett Perna recommends a supportive/comfort care approach with a hospice referral. [name] and her daughter are in agreement. She confirmed DO NOT RESUSCITATE status. As noted above we are obtaining a chest x-ray to evaluate the dyspnea. We will contact her once that result is available. She will return for a followup visit in approximately 3 weeks but will contact the office prior to that visit with any problems.  Patient seen with Dr. Truett Perna.   Lonna Cobb ANP/GNP-BC

## 2012-06-22 ENCOUNTER — Telehealth: Payer: Self-pay | Admitting: *Deleted

## 2012-06-22 DIAGNOSIS — C181 Malignant neoplasm of appendix: Secondary | ICD-10-CM

## 2012-06-22 NOTE — Telephone Encounter (Signed)
Patient has requested delay in first visit until 06/26/12 to allow her time to tell her husband about the referral. [facility] intake sheet faxed to [phone]

## 2012-06-22 NOTE — Telephone Encounter (Signed)
Pt notified that Dr Truett Perna saw cxr and it looks good, nothing to explain why she is having shortness of breath. Pt pleased

## 2012-06-23 ENCOUNTER — Ambulatory Visit: Payer: Self-pay | Admitting: Obstetrics & Gynecology

## 2012-06-29 ENCOUNTER — Telehealth: Payer: Self-pay | Admitting: *Deleted

## 2012-06-29 DIAGNOSIS — C181 Malignant neoplasm of appendix: Secondary | ICD-10-CM

## 2012-06-29 NOTE — Telephone Encounter (Signed)
Reporting increase in pressure/pain in mid to right abdomen that Tramadol does not seem to be controlling any longer. Reports pain tends to make her "catch my breath".  Allergic to codeine and hydrocodone... Also requests standing order for Senna 1-4/day and to use compazine 5 mg she has at home prn nausea. Asking if OK to discontinue her vitamins?

## 2012-06-30 MED ORDER — OXYCODONE HCL 5 MG PO TABS: 5.0000 mg | ORAL_TABLET | ORAL | Status: DC | PRN

## 2012-06-30 NOTE — Telephone Encounter (Signed)
Return call from Hospice nurse: patient reports hydrocodone was given to her postop and it "made me crazy". Does not recall a specific reaction to codeine--just lumped this with hydrocodone allergy. Per Dr. Truett Perna, will try oxyir 5 mg every 4 hours prn pain.

## 2012-07-06 ENCOUNTER — Telehealth: Payer: Self-pay | Admitting: *Deleted

## 2012-07-06 NOTE — Telephone Encounter (Signed)
Request for pt to have Miralax 17g QD, per Dr. Truett Perna OK to give Mirilax.  Hospice RN verbalized understanding.

## 2012-07-14 ENCOUNTER — Telehealth: Payer: Self-pay | Admitting: Oncology

## 2012-07-14 ENCOUNTER — Ambulatory Visit (HOSPITAL_COMMUNITY)
Admission: RE | Admit: 2012-07-14 | Discharge: 2012-07-14 | Disposition: A | Discharge status: Home / Self Care | Source: Ambulatory Visit | Attending: Oncology | Admitting: Oncology

## 2012-07-14 ENCOUNTER — Ambulatory Visit (HOSPITAL_BASED_OUTPATIENT_CLINIC_OR_DEPARTMENT_OTHER): Payer: Medicare Other | Admitting: Oncology

## 2012-07-14 VITALS — BP 135/71 | HR 99 | Temp 96.8°F | Resp 17 | Ht 61.0 in | Wt 211.2 lb

## 2012-07-14 DIAGNOSIS — R05 Cough: Secondary | ICD-10-CM | POA: Insufficient documentation

## 2012-07-14 DIAGNOSIS — C181 Malignant neoplasm of appendix: Secondary | ICD-10-CM

## 2012-07-14 DIAGNOSIS — R0602 Shortness of breath: Secondary | ICD-10-CM | POA: Insufficient documentation

## 2012-07-14 DIAGNOSIS — C786 Secondary malignant neoplasm of retroperitoneum and peritoneum: Secondary | ICD-10-CM

## 2012-07-14 DIAGNOSIS — K5900 Constipation, unspecified: Secondary | ICD-10-CM

## 2012-07-14 DIAGNOSIS — C801 Malignant (primary) neoplasm, unspecified: Secondary | ICD-10-CM

## 2012-07-14 DIAGNOSIS — G893 Neoplasm related pain (acute) (chronic): Secondary | ICD-10-CM

## 2012-07-14 DIAGNOSIS — R188 Other ascites: Secondary | ICD-10-CM | POA: Insufficient documentation

## 2012-07-14 DIAGNOSIS — R059 Cough, unspecified: Secondary | ICD-10-CM | POA: Insufficient documentation

## 2012-07-14 NOTE — Progress Notes (Signed)
   [facility]    OFFICE PROGRESS NOTE   INTERVAL HISTORY:   She returns as scheduled. She has enrolled in the [facility] hospice program. [name] complains of constipation. She last had a bowel movement 3 days ago. She reports intermittent nausea and vomiting. She has developed dyspnea over the past 3 weeks. The abdomen is distended. The leg swelling has improved. She continues to have intermittent sharp pain in the low abdomen and now has a "pressure "discomfort throughout the abdomen.  Objective:  Vital signs in last 24 hours:  Blood pressure 135/71, pulse 99, temperature 96.8 F (36 C), temperature source Oral, resp. rate 17, height 5\' 1"  (1.549 m), weight 211 lb 3.2 oz (95.8 kg).    Resp: Inspiratory rhonchi at the lower chest bilaterally Cardio: Regular rate and rhythm, tachycardia GI: Distended with multiple palpable masses. There appears to be a ascites. Active bowel sounds. Vascular: 1+ pitting edema at the low leg bilaterally Neuro: Alert and oriented      Lab Results:  Lab Results  Component Value Date   WBC 10.4* 05/24/2012   HGB 11.9 05/24/2012   HCT 36.4 05/24/2012   MCV 95.5 05/24/2012   PLT 258 05/24/2012      Medications: I have reviewed the patient's current medications.  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  -Xeloda resumed August 2013, restaging CT 03/06/2012 confirmed disease progression  -initiation of every 2 week panitumumab 04/26/2012.  2. Diabetes  3. Sleep apnea  4. Depression  5. Skin rash secondary to panitumumab. Improved.  6. Pitting edema of the lower legs-likely related to abdominal carcinomatosis and hypoalbuminemia. Improved 7. Abdominal pain secondary to metastatic appendiceal carcinoma-stable. She takes  Ultram as needed. 8. 2-3 week history of dyspnea. We will obtain a chest x-ray today.  9. Abdominal distention/constipation-? Ascites,? Bowel obstruction. She will be referred for an x-ray of the abdomen and a paracentesis today.   Disposition:  She is symptomatic with dyspnea and abdominal discomfort. She has developed constipation and nausea. We will refer her for a plain x-ray of the abdomen to look for evidence of obstruction. We will ask radiology to perform a therapeutic paracentesis. I suspect the dyspnea may be related in part to the distended abdomen. We will also check a chest x-ray today. I have a low clinical suspicion for a pulmonary embolism, but this is possible.  The plan is to continue hospice care at home. She will return for an office visit in approximately 3 weeks. We will followup on the x-ray evaluations today and recommend a laxative regimen as indicated.   Thornton Papas, MD  07/14/2012  11:26 AM

## 2012-07-14 NOTE — Procedures (Signed)
Successful US guided paracentesis from [facility].  Yielded 6L of bloody ascitic fluid.  No immediate complications.  Pt tolerated well.   Specimen was not sent for labs.  Brayton El PA-C 07/14/2012 2:15 PM

## 2012-07-14 NOTE — Telephone Encounter (Signed)
s.w. pt and advised on June est appt....pt ok and aware

## 2012-07-17 ENCOUNTER — Other Ambulatory Visit (HOSPITAL_COMMUNITY): Payer: Medicare Other

## 2012-08-03 ENCOUNTER — Ambulatory Visit (HOSPITAL_BASED_OUTPATIENT_CLINIC_OR_DEPARTMENT_OTHER): Payer: Medicare Other | Admitting: Oncology

## 2012-08-03 ENCOUNTER — Telehealth: Payer: Self-pay | Admitting: Oncology

## 2012-08-03 ENCOUNTER — Ambulatory Visit (HOSPITAL_COMMUNITY)
Admission: RE | Admit: 2012-08-03 | Discharge: 2012-08-03 | Disposition: A | Discharge status: Home / Self Care | Source: Ambulatory Visit | Attending: Oncology | Admitting: Oncology

## 2012-08-03 VITALS — BP 170/86 | HR 103 | Temp 96.7°F | Resp 18 | Ht 61.0 in | Wt 202.4 lb

## 2012-08-03 DIAGNOSIS — C801 Malignant (primary) neoplasm, unspecified: Secondary | ICD-10-CM

## 2012-08-03 DIAGNOSIS — R188 Other ascites: Secondary | ICD-10-CM | POA: Insufficient documentation

## 2012-08-03 DIAGNOSIS — C181 Malignant neoplasm of appendix: Secondary | ICD-10-CM

## 2012-08-03 DIAGNOSIS — R109 Unspecified abdominal pain: Secondary | ICD-10-CM | POA: Insufficient documentation

## 2012-08-03 NOTE — Progress Notes (Signed)
   [facility]    OFFICE PROGRESS NOTE   INTERVAL HISTORY:   She returns as scheduled. She felt better at paracentesis on 07/14/2012. The fluid returned approximately 2 weeks later. [name] reports increased abdominal pain. She has exertional dyspnea. She takes tramadol, but has not taken oxycodone. She complains of constipation. She is taking MiraLAX and Senokot daily. She has enrolled in the [facility] hospice program.  Objective:  Vital signs in last 24 hours:  Blood pressure 170/86, pulse 103, temperature 96.7 F (35.9 C), temperature source Oral, resp. rate 18, height 5\' 1"  (1.549 m), weight 202 lb 6.4 oz (91.808 kg), SpO2 94.00%.  Resp: Decreased breath sounds with inspiratory rhonchi at the lower posterior chest bilaterally Cardio: Regular rate and rhythm GI: Distended Vascular: Chronic stasis change at the left lower leg.  Skin: Small eschar at the left second or third toe without surrounding erythema or drainage     Medications: I have reviewed the patient's current medications.  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  -Xeloda resumed August 2013, restaging CT 03/06/2012 confirmed disease progression  -initiation of every 2 week panitumumab 04/26/2012.  2. Diabetes  3. Sleep apnea  4. Depression  5. Skin rash secondary to panitumumab. Improved.  6. Pitting edema of the lower legs-likely related to abdominal carcinomatosis and hypoalbuminemia. Improved  7. Abdominal pain secondary to metastatic appendiceal carcinoma-increased, I encouraged her to try the oxycodone 8. ascites-status post a palliative paracentesis 07/14/2012 , the fluid has reaccumulated-we will arrange for a paracentesis today 9. Exertional dyspnea-the dyspnea improved  following the paracentesis 07/14/2012. The dyspnea is most likely related to abdominal distention and atelectasis  Disposition:  She appears to be slowly declining in the setting of metastatic appendiceal carcinoma. She will be scheduled for a paracentesis today. She knows to contact us for reaccumulation of the ascites and we will arrange for another paracentesis. She will return for an office visit in approximately 3 weeks.   Thornton Papas, MD  08/03/2012  2:32 PM

## 2012-08-03 NOTE — Telephone Encounter (Signed)
s.w. pt and advised on 7.9.14 appt....pt ok and awaer

## 2012-08-03 NOTE — Procedures (Signed)
Successful US guided paracentesis from [facility].  Yielded 4.1L of bloody ascitic fluid.  No immediate complications.  Pt tolerated well.   Specimen was not sent for labs.  Brayton El PA-C 08/03/2012 3:53 PM

## 2012-08-04 ENCOUNTER — Other Ambulatory Visit: Payer: Self-pay | Admitting: *Deleted

## 2012-08-04 MED ORDER — TRAMADOL HCL 50 MG PO TABS: 25.0000 mg | ORAL_TABLET | Freq: Four times a day (QID) | ORAL | Status: DC | PRN

## 2012-08-11 ENCOUNTER — Telehealth: Payer: Self-pay | Admitting: Dietician

## 2012-08-17 ENCOUNTER — Telehealth: Payer: Self-pay | Admitting: Oncology

## 2012-08-17 NOTE — Telephone Encounter (Signed)
Pt 's daughter came by r/s appt to MD on 7/8 per Dr. Truett Perna

## 2012-08-23 ENCOUNTER — Ambulatory Visit: Payer: Medicare Other | Admitting: Nurse Practitioner

## 2012-08-23 ENCOUNTER — Ambulatory Visit (HOSPITAL_COMMUNITY)
Admission: RE | Admit: 2012-08-23 | Discharge: 2012-08-23 | Disposition: A | Discharge status: Home / Self Care | Payer: Medicare Other | Source: Ambulatory Visit | Attending: Oncology | Admitting: Oncology

## 2012-08-23 ENCOUNTER — Ambulatory Visit (HOSPITAL_BASED_OUTPATIENT_CLINIC_OR_DEPARTMENT_OTHER): Payer: Medicare Other | Admitting: Oncology

## 2012-08-23 VITALS — BP 164/72 | HR 122 | Temp 97.5°F | Resp 22 | Ht 61.0 in | Wt 199.8 lb

## 2012-08-23 DIAGNOSIS — C181 Malignant neoplasm of appendix: Secondary | ICD-10-CM

## 2012-08-23 DIAGNOSIS — C801 Malignant (primary) neoplasm, unspecified: Secondary | ICD-10-CM | POA: Insufficient documentation

## 2012-08-23 DIAGNOSIS — R188 Other ascites: Secondary | ICD-10-CM | POA: Insufficient documentation

## 2012-08-23 NOTE — Progress Notes (Signed)
   [facility]    OFFICE PROGRESS NOTE   INTERVAL HISTORY:   She returns as scheduled. She underwent a therapeutic paracentesis on 08/03/2012. Dyspnea improved after the paracentesis.  [name] complains of dyspnea and increased abdominal distention. She reports pain at the umbilicus. She takes tramadol approximately 4 times per day. She is not using oxycodone. She has constipation. Her appetite is poor. The hospice nurse is visiting once per week.  Objective:  Vital signs in last 24 hours:  Blood pressure 164/72, pulse 122, temperature 97.5 F (36.4 C), temperature source Oral, resp. rate 22, height 5\' 1"  (1.549 m), weight 199 lb 12.8 oz (90.629 kg), SpO2 99.00%.    Resp: lungs clear bilaterally with decreased breath sounds at the bases Cardio: regular rate and rhythm GI: the abdomen is distended, multiple palpable masses including a mass superior to the umbilicus. Vascular: no leg edema   Medications: I have reviewed the patient's current medications.  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  -Xeloda resumed August 2013, restaging CT 03/06/2012 confirmed disease progression  -initiation of every 2 week panitumumab 04/26/2012. She completed 3 treatments. 2. Diabetes  3. Sleep apnea  4. Depression  5. History of a Skin rash secondary to panitumumab. 6. Pitting edema of the lower legs-likely related to abdominal carcinomatosis and hypoalbuminemia. Improved  7. Abdominal pain secondary to metastatic appendiceal carcinoma-increased, I encouraged her to try the oxycodone  8. ascites-status post a palliative paracentesis 07/14/2012 and 08/03/2012, the fluid has reaccumulated-we will arrange for a paracentesis today  9. Exertional dyspnea-the dyspnea  improved following the paracentesis 07/14/2012 and 08/03/2012. The dyspnea is most likely related to abdominal distention and atelectasis   Disposition:  She is symptomatic with exertional dyspnea and ascites. We will refer her for a therapeutic paracentesis today. Her overall performance status appears to be declining. She will continue followup with the [facility] program.  She would like to continue close followup at the [facility].[name] will return for an office visit in 2 weeks.   Thornton Papas, MD  08/23/2012  4:25 PM

## 2012-08-23 NOTE — Procedures (Signed)
Successful US guided paracentesis from LLQ.  Yielded 4.8L of bloody ascitic fluid.  No immediate complications.  Pt tolerated well.   Specimen was not sent for labs.  Brayton El PA-C 08/23/2012 1:02 PM

## 2012-08-23 NOTE — Progress Notes (Signed)
Met with patient to assess for needs.  She currently denies barriers to care.  She has a strong family support system and states she has a lot of help with meals, transportation, and care.  She is looking forward to the thoracentesis and hoping it will alleviate some of her symptoms.

## 2012-09-05 ENCOUNTER — Other Ambulatory Visit: Payer: Self-pay | Admitting: *Deleted

## 2012-09-06 ENCOUNTER — Ambulatory Visit (HOSPITAL_COMMUNITY)
Admission: RE | Admit: 2012-09-06 | Discharge: 2012-09-06 | Disposition: A | Discharge status: Home / Self Care | Source: Ambulatory Visit | Attending: Oncology | Admitting: Oncology

## 2012-09-06 ENCOUNTER — Ambulatory Visit (HOSPITAL_COMMUNITY)
Admission: RE | Admit: 2012-09-06 | Discharge: 2012-09-06 | Disposition: A | Discharge status: Home / Self Care | Source: Ambulatory Visit | Attending: Nurse Practitioner | Admitting: Nurse Practitioner

## 2012-09-06 ENCOUNTER — Ambulatory Visit (HOSPITAL_BASED_OUTPATIENT_CLINIC_OR_DEPARTMENT_OTHER): Payer: Medicare Other | Admitting: Nurse Practitioner

## 2012-09-06 ENCOUNTER — Telehealth: Payer: Self-pay | Admitting: Oncology

## 2012-09-06 VITALS — BP 141/70 | HR 102 | Temp 97.1°F | Resp 17 | Ht 61.0 in | Wt 194.8 lb

## 2012-09-06 DIAGNOSIS — M79609 Pain in unspecified limb: Secondary | ICD-10-CM

## 2012-09-06 DIAGNOSIS — R188 Other ascites: Secondary | ICD-10-CM | POA: Insufficient documentation

## 2012-09-06 DIAGNOSIS — M7989 Other specified soft tissue disorders: Secondary | ICD-10-CM | POA: Insufficient documentation

## 2012-09-06 DIAGNOSIS — C181 Malignant neoplasm of appendix: Secondary | ICD-10-CM

## 2012-09-06 NOTE — Progress Notes (Signed)
*  Preliminary Results* Left lower extremity venous duplex completed. Left lower extremity is negative for deep vein thrombosis. There is no evidence of left Baker's cyst.  Attempted to call results to Dr.Sherrill's office at [phone], however there was no answer. The patient will be discharged and can be reached by phone for further instruction if necessary.  09/06/2012 12:31 PM  Gertie Fey, RVT, RDCS, RDMS

## 2012-09-06 NOTE — Progress Notes (Signed)
OFFICE PROGRESS NOTE  Interval history:  [name] returns as scheduled. She underwent a therapeutic paracentesis on 08/23/2012. She noted improvement in the dyspnea and abdominal distention following the procedure. She feels she needs to have the fluid removed again today. She has increased shortness of breath. She is having intermittent abdominal pain. Tramadol is not effective for the pain. She infrequently takes oxycodone. She continues to have constipation and nausea. She is taking several laxatives. She continues to have periodic "dizzy spells". She notes that the left leg feels "tight"   Objective: Blood pressure 141/70, pulse 102, temperature 97.1 F (36.2 C), temperature source Oral, resp. rate 17, height 5\' 1"  (1.549 m), weight 194 lb 12.8 oz (88.361 kg).  Oropharynx is without thrush. Lungs are clear. Regular cardiac rhythm. Abdomen is distended. Multiple palpable masses. Trace lower leg edema bilaterally. Calves are soft and nontender. Question faint pink discoloration along the left lateral lower leg.  Lab Results: Lab Results  Component Value Date   WBC 10.4* 05/24/2012   HGB 11.9 05/24/2012   HCT 36.4 05/24/2012   MCV 95.5 05/24/2012   PLT 258 05/24/2012    Chemistry:    Chemistry      Component Value Date/Time   NA 139 05/24/2012 0939   NA 142 08/06/2010 1501   K 4.2 05/24/2012 0939   K 4.1 08/06/2010 1501   CL 104 05/24/2012 0939   CL 99 08/06/2010 1501   CO2 25 05/24/2012 0939   CO2 35* 08/06/2010 1501   BUN 24.0 05/24/2012 0939   BUN 22 08/06/2010 1501   CREATININE 0.7 05/24/2012 0939   CREATININE 0.62 08/06/2010 1501      Component Value Date/Time   CALCIUM 8.5 05/24/2012 0939   CALCIUM 9.5 08/06/2010 1501   ALKPHOS 115 05/24/2012 0939   AST 17 05/24/2012 0939   ALT 12 05/24/2012 0939   BILITOT 0.39 05/24/2012 0939       Studies/Results: US Paracentesis  08/23/2012   *RADIOLOGY REPORT*  Clinical Data: History of metastatic adenocarcinoma.  Recurrent ascites.  ULTRASOUND GUIDED  PARACENTESIS  Comparison:  Previous paracentesis  An ultrasound guided paracentesis was thoroughly discussed with the patient and questions answered.  The benefits, risks, alternatives and complications were also discussed.  The patient understands and wishes to proceed with the procedure.  Written consent was obtained.  Ultrasound was performed to localize and mark an adequate pocket of fluid in the left lower quadrant of the abdomen.  The area was then prepped and draped in the normal sterile fashion.  1% Lidocaine was used for local anesthesia.  Under ultrasound guidance a 19 gauge Yueh catheter was introduced.  Paracentesis was performed.  The catheter was removed and a dressing applied.  Complications:  None  Findings:  A total of approximately 4.8 liters of bloody ascitic fluid was removed.  A fluid sample was not sent for laboratory analysis.  IMPRESSION: Successful ultrasound guided paracentesis yielding 4.8 liters of ascites.  Read by Brayton El PA-C   Original Report Authenticated By: Irish Lack, M.D.    Medications: I have reviewed the patient's current medications.  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  -Xeloda resumed August 2013, restaging CT 03/06/2012 confirmed disease progression  -initiation of every 2 week panitumumab 04/26/2012. She completed 3 treatments.  2. Diabetes  3. Sleep apnea  4. Depression  5. History of a  Skin rash secondary to panitumumab.  6. Pitting edema of the lower legs-likely related to abdominal carcinomatosis and hypoalbuminemia. Improved.  7. Abdominal pain secondary to metastatic appendiceal carcinoma-increased. She will continue oxycodone.  8. Ascites. She requires periodic therapeutic paracentesis procedures. We will try to  arrange for one today. 9. Exertional dyspnea-the dyspnea improves after a paracentesis. The dyspnea is most likely related to abdominal distention and atelectasis. 10. Left leg "tightness". She is concerned regarding a blood clot. We are referring her for a venous Doppler.  Disposition-[name]'s performance status is slowly declining. We will continue to follow on a supportive care approach. She is enrolled in the [facility] hospice program.  We are referring her for a therapeutic paracentesis today. We are also ordering a venous Doppler to rule out a DVT of the left leg.  She will return for a followup visit in approximately 3 weeks. She will contact the office in the interim with any problems. She understands to contact the office prior to her next visit if she feels she needs another paracentesis prior to that visit.  Plan reviewed with Dr. Truett Perna.   Lonna Cobb ANP/GNP-BC

## 2012-09-06 NOTE — Procedures (Signed)
Successful US guided paracentesis from RLQ.  Yielded 4.5 liters of blood tinged fluid.  No immediate complications.  Pt tolerated well.   Specimen was not sent for labs.  Pattricia Boss D PA-C 09/06/2012 2:54 PM

## 2012-09-06 NOTE — Telephone Encounter (Signed)
gv and printed avs for pt °

## 2012-09-21 ENCOUNTER — Ambulatory Visit (HOSPITAL_COMMUNITY)
Admission: RE | Admit: 2012-09-21 | Discharge: 2012-09-21 | Disposition: A | Discharge status: Home / Self Care | Source: Ambulatory Visit | Attending: Oncology | Admitting: Oncology

## 2012-09-21 ENCOUNTER — Telehealth: Payer: Self-pay | Admitting: *Deleted

## 2012-09-21 ENCOUNTER — Other Ambulatory Visit: Payer: Self-pay | Admitting: *Deleted

## 2012-09-21 DIAGNOSIS — C181 Malignant neoplasm of appendix: Secondary | ICD-10-CM

## 2012-09-21 DIAGNOSIS — R188 Other ascites: Secondary | ICD-10-CM | POA: Insufficient documentation

## 2012-09-21 NOTE — Procedures (Signed)
Successful US guided paracentesis from RLQ.  Yielded 4.6 liters of blood-tinged fluid.  No immediate complications.  Pt tolerated well.   Specimen was not sent for labs.  Pattricia Boss D PA-C 09/21/2012 3:29 PM

## 2012-09-21 NOTE — Telephone Encounter (Signed)
Patient left VM reporting she can't keep anything on her stomach and her abdominal pain is worse. Asking for paracentesis.

## 2012-09-21 NOTE — Telephone Encounter (Signed)
Confirmed abdominal pain worse and abdomen is swollen and tight. Arranged for paracentesis today at 2pm (daughter will transport her). Instructed her call office if the paracentesis does not help her pain or stop the vomiting. She understands and agrees.

## 2012-09-22 ENCOUNTER — Telehealth: Payer: Self-pay | Admitting: *Deleted

## 2012-09-22 NOTE — Telephone Encounter (Signed)
Called to follow up to be sure patient was doing well s/p paracentesis yesterday of 4.6 liters fluid. Was told she was out to lunch, but had wanted to talk with nurse. Instructed her to call back as needed during M-F office hours or to call her Hospice RN if she has urgent need.

## 2012-09-22 NOTE — Telephone Encounter (Signed)
Patient left voice mail with her phone # and reported today was [birth year] birthday. Did not voice any concern or complaint.

## 2012-09-26 ENCOUNTER — Telehealth: Payer: Self-pay | Admitting: *Deleted

## 2012-09-26 NOTE — Telephone Encounter (Signed)
Left message on voice mail re-scheduled 8/15 to 8/22 at 9am per Dr. Truett Perna.  Requested call back from pt to confirm receipt of this message.

## 2012-09-26 NOTE — Telephone Encounter (Signed)
Received message from pt to call her back regarding appt Friday 8/15.  Spoke with pt; she is requesting to change appt to next week 10/05/12 because "it might be too soon for a paracentesis"  Pt's last paracentesis was 09/21/12.  Informed pt MD schedule is full but will make him aware of request.  Pt verbalized understanding and request office call today with answer.  Note to MD.

## 2012-09-29 ENCOUNTER — Other Ambulatory Visit: Payer: Self-pay | Admitting: *Deleted

## 2012-09-29 ENCOUNTER — Ambulatory Visit: Payer: Medicare Other | Admitting: Oncology

## 2012-10-02 ENCOUNTER — Telehealth: Payer: Self-pay | Admitting: Oncology

## 2012-10-04 ENCOUNTER — Telehealth: Payer: Self-pay | Admitting: *Deleted

## 2012-10-04 DIAGNOSIS — C181 Malignant neoplasm of appendix: Secondary | ICD-10-CM

## 2012-10-04 NOTE — Telephone Encounter (Signed)
Call from pt asking if paracentesis has been scheduled for 8/22 after office visit. It has not. Orders entered. Scheduled for 11AM

## 2012-10-06 ENCOUNTER — Telehealth: Payer: Self-pay

## 2012-10-06 ENCOUNTER — Ambulatory Visit (HOSPITAL_COMMUNITY)
Admission: RE | Admit: 2012-10-06 | Discharge: 2012-10-06 | Disposition: A | Discharge status: Home / Self Care | Source: Ambulatory Visit | Attending: Oncology | Admitting: Oncology

## 2012-10-06 ENCOUNTER — Ambulatory Visit (HOSPITAL_BASED_OUTPATIENT_CLINIC_OR_DEPARTMENT_OTHER): Admitting: Oncology

## 2012-10-06 VITALS — BP 119/67 | HR 121 | Temp 96.9°F | Resp 20 | Ht 61.0 in | Wt 185.0 lb

## 2012-10-06 DIAGNOSIS — R188 Other ascites: Secondary | ICD-10-CM

## 2012-10-06 DIAGNOSIS — C801 Malignant (primary) neoplasm, unspecified: Secondary | ICD-10-CM

## 2012-10-06 DIAGNOSIS — C785 Secondary malignant neoplasm of large intestine and rectum: Secondary | ICD-10-CM

## 2012-10-06 DIAGNOSIS — C181 Malignant neoplasm of appendix: Secondary | ICD-10-CM | POA: Insufficient documentation

## 2012-10-06 MED ORDER — ONDANSETRON HCL 8 MG PO TABS: 8.0000 mg | ORAL_TABLET | Freq: Two times a day (BID) | ORAL | Status: DC | PRN

## 2012-10-06 NOTE — Procedures (Signed)
Successful US guided paracentesis from RLQ.  Yielded 3.8 liters of blood tinged fluid.  No immediate complications.  Pt tolerated well.   Specimen was not sent for labs.  Pattricia Boss D PA-C 10/06/2012 12:27 PM

## 2012-10-06 NOTE — Telephone Encounter (Signed)
gv and printed appt sched and avs for pt for Aug and SEpt °

## 2012-10-07 NOTE — Progress Notes (Signed)
   [facility]    OFFICE PROGRESS NOTE   INTERVAL HISTORY:   She returns as scheduled. She complains of intermittent nausea, abdominal distention/pain, and shoulder pain. She feels better after paracentesis procedures. She takes tramadol for pain. Oxycodone caused confusion.  Objective:  Vital signs in last 24 hours:  Blood pressure 119/67, pulse 121, temperature 96.9 F (36.1 C), temperature source Oral, resp. rate 20, height 5\' 1"  (1.549 m), weight 185 lb (83.915 kg).  Resp: decreased breath sounds at the lower chest, no respiratory distress Cardio: regular rate and rhythm GI: distended with palpable masses, whitish discharge at the umbilicus Neuro:alert and oriented, ambulates a short distance with assistance     Medications: I have reviewed the patient's current medications.  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  -Xeloda resumed August 2013, restaging CT 03/06/2012 confirmed disease progression  -initiation of every 2 week panitumumab 04/26/2012. She completed 3 treatments.  2. Diabetes  3. Sleep apnea  4. Depression  5. History of a Skin rash secondary to panitumumab.  6. Pitting edema of the lower legs-likely related to abdominal carcinomatosis and hypoalbuminemia. Improved.  7. 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 8. Ascites. She requires periodic therapeutic paracentesis procedures. She is scheduled for a paracentesis today. 9. Exertional dyspnea-the dyspnea improves after a paracentesis. The dyspnea is most likely related to abdominal distention and atelectasis.  10. Left leg "tightness".Negative left leg Doppler on 09/06/2012   Disposition:  Ms. Zellmer appears to be slowly  declining.she will continue tramadol for pain. We decided to try a paracentesis each week to see if this will help the nausea and pain. She plans to see Dr. Kevan Ny next week to discuss her overall medical regimen. I suspect many of her medications can be discontinued.  She will continue followup with the [facility] program. Ms. Theall will return for an office visit in 3 weeks.   Thornton Papas, MD  10/07/2012  2:34 PM

## 2012-10-12 ENCOUNTER — Other Ambulatory Visit: Payer: Self-pay | Admitting: *Deleted

## 2012-10-12 DIAGNOSIS — C181 Malignant neoplasm of appendix: Secondary | ICD-10-CM

## 2012-10-12 MED ORDER — PROCHLORPERAZINE MALEATE 5 MG PO TABS: 5.0000 mg | ORAL_TABLET | Freq: Four times a day (QID) | ORAL | Status: DC | PRN

## 2012-10-13 ENCOUNTER — Encounter (HOSPITAL_COMMUNITY): Payer: Self-pay | Admitting: Cardiology

## 2012-10-13 ENCOUNTER — Emergency Department (HOSPITAL_COMMUNITY)

## 2012-10-13 ENCOUNTER — Telehealth: Payer: Self-pay | Admitting: Dietician

## 2012-10-13 ENCOUNTER — Ambulatory Visit (HOSPITAL_COMMUNITY): Admission: RE | Admit: 2012-10-13 | Payer: Medicare Other | Source: Ambulatory Visit

## 2012-10-13 ENCOUNTER — Inpatient Hospital Stay (HOSPITAL_COMMUNITY)
Admission: EM | Admit: 2012-10-13 | Discharge: 2012-10-18 | DRG: 065 | Disposition: A | Discharge status: Rehabilitation Facility | Attending: Internal Medicine | Admitting: Internal Medicine

## 2012-10-13 ENCOUNTER — Inpatient Hospital Stay (HOSPITAL_COMMUNITY)

## 2012-10-13 DIAGNOSIS — C786 Secondary malignant neoplasm of retroperitoneum and peritoneum: Secondary | ICD-10-CM | POA: Diagnosis present

## 2012-10-13 DIAGNOSIS — I6350 Cerebral infarction due to unspecified occlusion or stenosis of unspecified cerebral artery: Secondary | ICD-10-CM

## 2012-10-13 DIAGNOSIS — R180 Malignant ascites: Secondary | ICD-10-CM

## 2012-10-13 DIAGNOSIS — Z9221 Personal history of antineoplastic chemotherapy: Secondary | ICD-10-CM

## 2012-10-13 DIAGNOSIS — S0003XA Contusion of scalp, initial encounter: Secondary | ICD-10-CM | POA: Diagnosis present

## 2012-10-13 DIAGNOSIS — E1149 Type 2 diabetes mellitus with other diabetic neurological complication: Secondary | ICD-10-CM | POA: Diagnosis present

## 2012-10-13 DIAGNOSIS — R1319 Other dysphagia: Secondary | ICD-10-CM | POA: Diagnosis present

## 2012-10-13 DIAGNOSIS — Z515 Encounter for palliative care: Secondary | ICD-10-CM

## 2012-10-13 DIAGNOSIS — Z66 Do not resuscitate: Secondary | ICD-10-CM | POA: Diagnosis present

## 2012-10-13 DIAGNOSIS — N390 Urinary tract infection, site not specified: Secondary | ICD-10-CM

## 2012-10-13 DIAGNOSIS — K922 Gastrointestinal hemorrhage, unspecified: Secondary | ICD-10-CM

## 2012-10-13 DIAGNOSIS — J323 Chronic sphenoidal sinusitis: Secondary | ICD-10-CM | POA: Diagnosis present

## 2012-10-13 DIAGNOSIS — I10 Essential (primary) hypertension: Secondary | ICD-10-CM | POA: Diagnosis present

## 2012-10-13 DIAGNOSIS — G4733 Obstructive sleep apnea (adult) (pediatric): Secondary | ICD-10-CM | POA: Diagnosis present

## 2012-10-13 DIAGNOSIS — R471 Dysarthria and anarthria: Secondary | ICD-10-CM | POA: Diagnosis present

## 2012-10-13 DIAGNOSIS — I639 Cerebral infarction, unspecified: Secondary | ICD-10-CM

## 2012-10-13 DIAGNOSIS — I6340 Cerebral infarction due to embolism of unspecified cerebral artery: Principal | ICD-10-CM | POA: Diagnosis present

## 2012-10-13 DIAGNOSIS — E1142 Type 2 diabetes mellitus with diabetic polyneuropathy: Secondary | ICD-10-CM | POA: Diagnosis present

## 2012-10-13 DIAGNOSIS — Z9181 History of falling: Secondary | ICD-10-CM

## 2012-10-13 DIAGNOSIS — D649 Anemia, unspecified: Secondary | ICD-10-CM

## 2012-10-13 DIAGNOSIS — F329 Major depressive disorder, single episode, unspecified: Secondary | ICD-10-CM | POA: Diagnosis present

## 2012-10-13 DIAGNOSIS — C181 Malignant neoplasm of appendix: Secondary | ICD-10-CM

## 2012-10-13 DIAGNOSIS — F3289 Other specified depressive episodes: Secondary | ICD-10-CM | POA: Diagnosis present

## 2012-10-13 LAB — CBC
HCT: 23.5 % — ABNORMAL LOW (ref 36.0–46.0)
Hemoglobin: 6.8 g/dL — CL (ref 12.0–15.0)
MCH: 20.6 pg — ABNORMAL LOW (ref 26.0–34.0)
MCH: 21.1 pg — ABNORMAL LOW (ref 26.0–34.0)
MCV: 72.7 fL — ABNORMAL LOW (ref 78.0–100.0)
MCV: 72.8 fL — ABNORMAL LOW (ref 78.0–100.0)
Platelets: 381 10*3/uL (ref 150–400)
RBC: 3.3 MIL/uL — ABNORMAL LOW (ref 3.87–5.11)
RBC: 3.23 MIL/uL — ABNORMAL LOW (ref 3.87–5.11)

## 2012-10-13 LAB — POCT I-STAT, CHEM 8
BUN: 17 mg/dL (ref 6–23)
Calcium, Ion: 1.17 mmol/L (ref 1.13–1.30)
Creatinine, Ser: 0.8 mg/dL (ref 0.50–1.10)
Glucose, Bld: 144 mg/dL — ABNORMAL HIGH (ref 70–99)
Hemoglobin: 8.2 g/dL — ABNORMAL LOW (ref 12.0–15.0)
TCO2: 28 mmol/L (ref 0–100)

## 2012-10-13 LAB — COMPREHENSIVE METABOLIC PANEL
BUN: 17 mg/dL (ref 6–23)
CO2: 26 mEq/L (ref 19–32)
Chloride: 105 mEq/L (ref 96–112)
Creatinine, Ser: 0.66 mg/dL (ref 0.50–1.10)
GFR calc non Af Amer: 77 mL/min — ABNORMAL LOW (ref >90)
Glucose, Bld: 147 mg/dL — ABNORMAL HIGH (ref 70–99)
Total Bilirubin: <0.1 mg/dL — ABNORMAL LOW (ref 0.3–1.2)

## 2012-10-13 LAB — URINE MICROSCOPIC-ADD ON

## 2012-10-13 LAB — DIFFERENTIAL
Basophils Relative: 1 % (ref 0–1)
Eosinophils Relative: 3 % (ref 0–5)
Lymphs Abs: 1.2 10*3/uL (ref 0.7–4.0)
Monocytes Absolute: 0.8 10*3/uL (ref 0.1–1.0)
Neutro Abs: 6.3 10*3/uL (ref 1.7–7.7)

## 2012-10-13 LAB — URINALYSIS, ROUTINE W REFLEX MICROSCOPIC
Glucose, UA: NEGATIVE mg/dL
Hgb urine dipstick: NEGATIVE
Leukocytes, UA: NEGATIVE
Specific Gravity, Urine: 1.02 (ref 1.005–1.030)
Urobilinogen, UA: 0.2 mg/dL (ref 0.0–1.0)

## 2012-10-13 LAB — RAPID URINE DRUG SCREEN, HOSP PERFORMED: Opiates: NOT DETECTED

## 2012-10-13 LAB — OCCULT BLOOD, POC DEVICE: Fecal Occult Bld: POSITIVE — AB

## 2012-10-13 LAB — PROTIME-INR: Prothrombin Time: 12.3 seconds (ref 11.6–15.2)

## 2012-10-13 LAB — GLUCOSE, CAPILLARY: Glucose-Capillary: 88 mg/dL (ref 70–99)

## 2012-10-13 LAB — TROPONIN I: Troponin I: <0.3 ng/mL (ref <0.30)

## 2012-10-13 MED ORDER — DEXTROSE 5 % IV SOLN
1.0000 g | Freq: Once | INTRAVENOUS | Status: AC
  Administered 2012-10-13: 1 g via INTRAVENOUS
  Filled 2012-10-13: qty 10

## 2012-10-13 MED ORDER — SENNOSIDES 8.6 MG PO TABS: 1.0000 | ORAL_TABLET | Freq: Two times a day (BID) | ORAL | Status: DC

## 2012-10-13 MED ORDER — PANTOPRAZOLE SODIUM 40 MG IV SOLR
40.0000 mg | Freq: Once | INTRAVENOUS | Status: AC
  Administered 2012-10-13: 40 mg via INTRAVENOUS
  Filled 2012-10-13: qty 40

## 2012-10-13 MED ORDER — MORPHINE SULFATE 2 MG/ML IJ SOLN
2.0000 mg | INTRAMUSCULAR | Status: DC | PRN
  Administered 2012-10-13 – 2012-10-16 (×4): 2 mg via INTRAVENOUS
  Filled 2012-10-13 (×4): qty 1

## 2012-10-13 MED ORDER — ONDANSETRON HCL 4 MG PO TABS: 8.0000 mg | ORAL_TABLET | Freq: Two times a day (BID) | ORAL | Status: DC | PRN

## 2012-10-13 MED ORDER — METFORMIN HCL 500 MG PO TABS
500.0000 mg | ORAL_TABLET | Freq: Two times a day (BID) | ORAL | Status: DC
  Filled 2012-10-13 (×4): qty 1

## 2012-10-13 MED ORDER — POLYETHYLENE GLYCOL 3350 17 G PO PACK
17.0000 g | PACK | Freq: Every day | ORAL | Status: DC
  Administered 2012-10-15 – 2012-10-18 (×4): 17 g via ORAL
  Filled 2012-10-13 (×6): qty 1

## 2012-10-13 MED ORDER — BUPROPION HCL ER (SR) 100 MG PO TB12
100.0000 mg | ORAL_TABLET | Freq: Every day | ORAL | Status: DC
  Administered 2012-10-15 – 2012-10-18 (×4): 100 mg via ORAL
  Filled 2012-10-13 (×6): qty 1

## 2012-10-13 MED ORDER — ASPIRIN 81 MG PO CHEW: 81.0000 mg | CHEWABLE_TABLET | Freq: Every day | ORAL | Status: DC

## 2012-10-13 MED ORDER — DEXTROSE 5 % IV SOLN
1.0000 g | INTRAVENOUS | Status: AC
  Administered 2012-10-14 – 2012-10-15 (×2): 1 g via INTRAVENOUS
  Filled 2012-10-13 (×2): qty 10

## 2012-10-13 MED ORDER — VENLAFAXINE HCL ER 75 MG PO CP24
75.0000 mg | ORAL_CAPSULE | Freq: Every day | ORAL | Status: DC
  Administered 2012-10-14 – 2012-10-18 (×5): 75 mg via ORAL
  Filled 2012-10-13 (×6): qty 1

## 2012-10-13 MED ORDER — SODIUM CHLORIDE 0.9 % IV BOLUS (SEPSIS)
500.0000 mL | Freq: Once | INTRAVENOUS | Status: AC
  Administered 2012-10-13: 500 mL via INTRAVENOUS

## 2012-10-13 MED ORDER — VENLAFAXINE HCL 75 MG PO TABS: 75.0000 mg | ORAL_TABLET | Freq: Every day | ORAL | Status: DC

## 2012-10-13 MED ORDER — PROCHLORPERAZINE MALEATE 5 MG PO TABS
5.0000 mg | ORAL_TABLET | Freq: Four times a day (QID) | ORAL | Status: DC | PRN
  Filled 2012-10-13: qty 1

## 2012-10-13 MED ORDER — ONDANSETRON HCL 4 MG/2ML IJ SOLN
4.0000 mg | Freq: Four times a day (QID) | INTRAMUSCULAR | Status: DC | PRN
  Administered 2012-10-13: 4 mg via INTRAVENOUS
  Filled 2012-10-13: qty 2

## 2012-10-13 MED ORDER — POTASSIUM CHLORIDE CRYS ER 10 MEQ PO TBCR
10.0000 meq | EXTENDED_RELEASE_TABLET | Freq: Every day | ORAL | Status: DC
  Administered 2012-10-15 – 2012-10-18 (×4): 10 meq via ORAL
  Filled 2012-10-13 (×6): qty 1

## 2012-10-13 MED ORDER — TRAMADOL HCL 50 MG PO TABS
100.0000 mg | ORAL_TABLET | Freq: Four times a day (QID) | ORAL | Status: DC | PRN
  Administered 2012-10-16 (×3): 100 mg via ORAL
  Filled 2012-10-13 (×3): qty 2

## 2012-10-13 MED ORDER — CARVEDILOL 3.125 MG PO TABS
3.1250 mg | ORAL_TABLET | Freq: Two times a day (BID) | ORAL | Status: DC
  Administered 2012-10-14 – 2012-10-18 (×8): 3.125 mg via ORAL
  Filled 2012-10-13 (×12): qty 1

## 2012-10-13 MED ORDER — INSULIN ASPART 100 UNIT/ML ~~LOC~~ SOLN: 0.0000 [IU] | SUBCUTANEOUS | Status: DC

## 2012-10-13 MED ORDER — DEXTROSE-NACL 5-0.9 % IV SOLN
INTRAVENOUS | Status: DC
  Administered 2012-10-14: via INTRAVENOUS

## 2012-10-13 NOTE — H&P (Addendum)
[facility] History and Physical  [name] WUJ:[id number] DOB: [birth year] DOA: 10/13/2012  Referring physician:  PCP: Pearla Dubonnet, MD  Specialists: Neurology   Chief Complaint: fall, slurred speech   HPI: [name] is a 79 y.o. female with MPH of Metastatic adenocarcinoma -carcinomatosis Dx- 2012 (was previous on chemo-xeloda, pantimumamab, but stopped due to progression of cancer/hospice), malignant ascites on weekly paracentesis, DM, HTN, OSA, depression, chronic cancer related pain who woke up  early this morning with pain. Later she got up to use the bathroom she had some difficulty then fell. When her aide found her she was on the floor she had a left facial droop and was weak on the left side. EMS was called and the patient was brought in as a code stroke. Initial NIHSS of 8. -patient also found to have anemia Hg-6.8 with + occult blood -no TPA due to metastatic CA and anemia/GIB  -patient was under hospice care, aid called 911 and patient was brought to ED    Review of Systems: The patient denies anorexia, fever, , vision loss, decreased hearing, hoarseness, chest pain,  hemoptysis,  severe indigestion/heartburn, hematuria, incontinence, genital sores, remaining ROS non contributory   Past Medical History  Diagnosis Date  . Cataract   . Diabetes mellitus   . Cancer    Past Surgical History  Procedure Laterality Date  . Bladder surgery  06/1989  . Knee surgery  10/30/1993    Lt   . Back surgery  20/11/1998    ruptured disc  . Colon surgery    . Hernia repair     Social History:  reports that she has never smoked. She has never used smokeless tobacco. She reports that she does not drink alcohol. Her drug history is not on file. Home   Allergies  Allergen Reactions  . Oxycodone Other (See Comments)    hallucinations  . Ace Inhibitors Other (See Comments)    Reaction unknown  . Ciprofloxacin Hcl Other (See Comments)    Reaction unknown  .  Hydrocodone-Acetaminophen Other (See Comments)    Reports made her "crazy"    History reviewed. No pertinent family history. daughter at t he bedside  (be sure to complete)  Prior to Admission medications   Medication Sig Start Date End Date Taking? Authorizing Provider  aspirin 81 MG tablet Take 81 mg by mouth daily.     Yes Historical Provider, MD  buPROPion (WELLBUTRIN SR) 100 MG 12 hr tablet Take 100 mg by mouth daily.  03/22/12  Yes Historical Provider, MD  carvedilol (COREG) 3.125 MG tablet Take 3.125 mg by mouth 2 (two) times daily with a meal.     Yes Historical Provider, MD  furosemide (LASIX) 20 MG tablet Take 40 mg by mouth daily.    Yes Historical Provider, MD  KLOR-CON M20 20 MEQ tablet Take 10 mEq by mouth daily.  09/24/12  Yes Historical Provider, MD  lisinopril (PRINIVIL,ZESTRIL) 5 MG tablet Take 5 mg by mouth daily.     Yes Historical Provider, MD  meloxicam (MOBIC) 15 MG tablet Take 15 mg by mouth daily.  03/25/12  Yes Historical Provider, MD  metFORMIN (GLUCOPHAGE) 500 MG tablet Take 500 mg by mouth 2 (two) times daily.  03/17/12  Yes Historical Provider, MD  ondansetron (ZOFRAN) 8 MG tablet Take 1 tablet (8 mg total) by mouth every 12 (twelve) hours as needed for nausea. 10/06/12  Yes Ladene Artist, MD  polyethylene glycol [facility]) packet Take  17 g by mouth daily.   Yes Historical Provider, MD  prochlorperazine (COMPAZINE) 5 MG tablet Take 1 tablet (5 mg total) by mouth every 6 (six) hours as needed for nausea. 10/12/12  Yes Ladene Artist, MD  senna (SENOKOT) 8.6 MG tablet Take 1 tablet by mouth 2 (two) times daily.    Yes Historical Provider, MD  traMADol (ULTRAM) 50 MG tablet Take 100 mg by mouth every 6 (six) hours as needed for pain.   Yes Historical Provider, MD  venlafaxine (EFFEXOR) 75 MG tablet Take 75 mg by mouth daily.    Yes Historical Provider, MD   Physical Exam: Filed Vitals:   10/13/12 1300  BP: 122/63  Pulse: 99  Temp:   Resp: 18      General:  Alert, awake   Eyes: perrla   ENT: no oral ulcers   Neck: supple   Cardiovascular: S1, S2, RRR  Respiratory: CTA   Abdomen: soft, distended, dull topercussion; +BS  Skin: few rash   Musculoskeletal: mild edema LE  Psychiatric: no hallucinations   Neurologic: facial droop, L sided motor 4/5; sensation decreased L LE  Labs on Admission:  Basic Metabolic Panel:  Recent Labs Lab 10/13/12 0900 10/13/12 0906  NA 141 142  K 4.2 4.2  CL 105 103  CO2 26  --   GLUCOSE 147* 144*  BUN 17 17  CREATININE 0.66 0.80  CALCIUM 8.7  --    Liver Function Tests:  Recent Labs Lab 10/13/12 0900  AST 13  ALT 9  ALKPHOS 55  BILITOT <0.1*  PROT 6.0  ALBUMIN 2.2*   No results found for this basename: LIPASE, AMYLASE,  in the last 168 hours No results found for this basename: AMMONIA,  in the last 168 hours CBC:  Recent Labs Lab 10/13/12 0900 10/13/12 0906 10/13/12 1045  WBC 8.7  --  9.2  NEUTROABS 6.3  --   --   HGB 6.8* 8.2* 6.8*  HCT 24.0* 24.0* 23.5*  MCV 72.7*  --  72.8*  PLT 381  --  396   Cardiac Enzymes:  Recent Labs Lab 10/13/12 0900  TROPONINI <0.30    BNP (last 3 results) No results found for this basename: PROBNP,  in the last 8760 hours CBG:  Recent Labs Lab 10/13/12 0908  GLUCAP 128*    Radiological Exams on Admission: Dg Shoulder Right  10/13/2012   *RADIOLOGY REPORT*  Clinical Data: Right shoulder pain after fall.  RIGHT SHOULDER - 2+ VIEW  Comparison: None.  Findings: No fracture or dislocation is noted.  Visualized ribs appear normal.  Mild degenerative changes seen involving the right acromioclavicular joint.  There is severe degenerative change involving the right glenohumeral joint with deformity of right humeral head as a result.  Severe narrowing of the right acromiohumeral space is noted consistent with rotator cuff injury.  IMPRESSION: Severe degenerative joint disease of right glenohumeral joint is noted.  No acute  fracture or dislocation is noted.  Severe narrowing of right acromiohumeral space is noted consistent with rotator cuff injury.   Original Report Authenticated By: Lupita Raider.,  M.D.   Ct Head Wo Contrast  10/13/2012   CLINICAL DATA:  Unwitnessed fall. Left-sided weakness with slurred speech and left facial droop. Code stroke.  EXAM: CT HEAD WITHOUT CONTRAST  TECHNIQUE: Contiguous axial images were obtained from the base of the skull through the vertex without intravenous contrast.  COMPARISON:  None.  FINDINGS: The brainstem, cerebellum, cerebral peduncles, thalamus, basal  ganglia, basilar cisterns, and ventricular system appear within normal limits. Periventricular white matter and corona radiata hypodensities favor chronic ischemic microvascular white matter disease. No intracranial hemorrhage, mass lesion, or acute CVA. Left parietal scalp hematoma observed. There is chronic sphenoid sinusitis.  IMPRESSION: 1. Chronic sphenoid sinusitis. No acute intracranial findings. 2. Periventricular white matter and corona radiata hypodensities favor chronic ischemic microvascular white matter disease. 3. Left parietal scalp hematoma.  These results were called by telephone at the time of interpretation on 10/13/2012 at 9:08 AM to Dr. Thad Ranger, who verbally acknowledged these results.   Electronically Signed   By: Herbie Baltimore   On: 10/13/2012 09:13   Ct Cervical Spine Wo Contrast  10/13/2012   *RADIOLOGY REPORT*  Clinical Data: Fall  CT CERVICAL SPINE WITHOUT CONTRAST  Technique:  Multidetector CT imaging of the cervical spine was performed. Multiplanar CT image reconstructions were also generated.  Comparison: 05/01/2007  Findings:  Axial images shows no acute fracture or subluxation. There is diffuse osteopenia.  There is no pneumothorax in visualized lung apices.  Loculated small right pleural effusion noted posteriorly.  Atherosclerotic calcifications of the thoracic aorta.  Computer processed images shows  no acute fracture or subluxation. Degenerative changes are noted C1-C2 articulation.  Mild disc space flattening at C3-C4 level.  Mild to moderate disc space flattening with anterior spurring and mild posterior spurring at C4-C5 level. Significant disc space flattening with anterior spurring and vacuum disc phenomenon at C5-C6 level.  Significant disc space flattening with anterior spurring and partial bony fusion at C6-C7 level. Partial bony fusion noted at C 7 T1 vertebral body.  Disc space flattening with anterior spurring noted at T1 T2 level.  Mild spinal canal stenosis due to posterior spurring at C5-C6 level.  No prevertebral soft tissue swelling.  Cervical airway is patent.  IMPRESSION: No acute fracture or subluxation.  Diffuse osteopenia.  Multilevel degenerative changes as described above.  Small loculated right pleural effusion.   Original Report Authenticated By: Natasha Mead, M.D.   Dg Pelvis Portable  10/13/2012   *RADIOLOGY REPORT*  Clinical Data: Larey Seat.  Right hip pain.  PORTABLE PELVIS  Comparison: 07/14/2012.  Findings: Both hips are normally located.  Advanced degenerative joint disease bilaterally.  No definite acute hip fracture.  The pubic symphysis demonstrates moderate degenerative changes.  No definite pubic rami fractures.  The SI joints are intact.  Mild degenerative changes.  No definite pelvic fractures.  Advanced degenerative changes noted in the lower lumbar spine could  IMPRESSION: Degenerative changes but no definite acute hip or pelvic fracture.   Original Report Authenticated By: Rudie Meyer, M.D.   Dg Chest Portable 1 View  10/13/2012   *RADIOLOGY REPORT*  Clinical Data: Fall, stroke  PORTABLE CHEST - 1 VIEW  Comparison: 07/14/2012  Findings: Cardiomediastinal silhouette is stable.  Mild emphysematous changes.  No pulmonary edema.  There is hazy right base medially atelectasis or infiltrate.  Extensive degenerative changes bilateral shoulders.  IMPRESSION:  Mild emphysematous  changes.  No pulmonary edema.  There is hazy right base medially atelectasis or infiltrate.  Extensive degenerative changes bilateral shoulders.   Original Report Authenticated By: Natasha Mead, M.D.    EKG: Independently reviewed. NSR  Assessment/Plan Active Problems:   CVA (cerebral infarction)   79 y.o. female with MPH of Metastatic adenocarcinoma -carcinomatosis Dx- 2012 (was previous on chemo-xeloda, pantimumamab, but stopped due to progression of cancer/hospice), malignant ascites on weekly paracentesis, DM, HTN, OSA, depression, chronic cancer related pain who woke up  early this morning with pain. Later she got up to use the bathroom she had some difficulty then fell. When her aide found her she was on the floor she had a left facial droop and was weak on the left side. EMS was called and the patient was brought in as a code stroke. Initial NIHSS of 8. -patient also found to have anemia Hg-6.8 with + occult blood -no TPA due to metastatic CA and anemia/GIB  1. Slurred speech with L sided weakness, parastesia likely Acute CVA ischemic vs CA metastasis related;  -Patient was seen by neurologist; stroke work up recommended; initiated order protocol; pend MRI, echo, doppler, PT eval, speech eval  -could start diet if passed bedside swallow eval; but may need speech eval, although no clear benefit in a patient with advanced CA on hospice   2.  Metastatic adenocarcinoma -carcinomatosis Dx- 2012 (was previous on chemo-xeloda, pantimumamab, but stopped due to progression of cancer/hospice), malignant ascites on weekly paracentesis -arrange palliative paracentesis  -Per Dr. Kalman Drape note patient has been declining, currently under hospice care  -c/s hospice care  3. Anemia with +occult blood; possible anemia of chronic disease/cancer on top possible GIB; -Hg initially 6.8 then on repeat checks 8.2-->6.8  -no obvious rectal bleeding noticed by patient, except hemorrhoids; no s/s of acute  bleeding  -TF blood 1 units started in ED, monitor CBC,  4. HTN, allow permissive HTN; hold ACE lisinopril 5 mg and lasix 40 mg; but cont BB; resume lasix based on volume status, likely when started diet  5. Possible UTI; started IV atx; f/u urine c/s;  6. DM no recent HA1C; patient is on metformin; ISS   Currently, patient/faimily would like to investigate CVA;  -Will need hospice involvement; probably may need to consider to d/c many meds if patient family agrees;   ER: Dr. Thana Farr consulted   Code Status: DNR  Family Communication: discussed with daughter  Rosey Bath at the bedside [phone]  Disposition Plan: lpend eval  (indicate anticipated LOS)  Time spent: > 30 minutes   Esperanza Sheets [facility] Pager [phone]  If 7PM-7AM, please contact night-coverage [email or web address] Password [email or web address] 10/13/2012, 1:21 PM

## 2012-10-13 NOTE — ED Notes (Signed)
Report called to unit. 

## 2012-10-13 NOTE — ED Notes (Signed)
Xray at the bedside.

## 2012-10-13 NOTE — Progress Notes (Signed)
Palliative Medicine Team Consult received; spoke with Dr York Spaniel who informed the purpose of the consult was to be sure the hospice following this patient was aware of the admission- patient is currently followed by [facility]); see today's note from [facility] SW; the [facility] team can be reached for questions/concerns at [phone]  Per discussion with Dr York Spaniel at this time there are no additional needs from PMT; Please re-consult if our team can be of assistance.  Valente David, RN 10/13/2012, 4:45 PM Palliative Medicine Team RN Liaison [phone]

## 2012-10-13 NOTE — ED Notes (Signed)
Pt to department via EMS- pt was LSN this morning at 0530 when she was taken to the bathroom by her caregiver. Pt was then found in the bathroom with an unwitnessed and left sided weakness, facial droop and slurred speech. Pt has hx of cancer. Bp-155/105 Hr-90 CBG-132 Arrived on LSB and c-collar.

## 2012-10-13 NOTE — Progress Notes (Signed)
[facility] MSW note: Patient is a current [facility] pt. Pt is a DNR. Pt lives at home with her spouse who also has health issues. Pt and spouse have live-in sitters to assist in their care. Pt came in this am after a fall in the bathroom. Daughter-Theresa is a Charity fundraiser at [employer] and is very active in pt's care. Pt was alert and oriented. Pt has slurred speech. Pt is concerned about discharge plans. MSW educated daughter on SNF, home with continued [facility] services and [facility] if medically eligible(2 weeks prognosis). No set discharge plans yet. MSW offered emotional support and active listening. Please call with questions or concerns.   Elijio Miles, MSW

## 2012-10-13 NOTE — ED Provider Notes (Signed)
CSN: [id number]     Arrival date & time 10/13/12  0845 History   First MD Initiated Contact with Patient 10/13/12 [phone]     No chief complaint on file.  (Consider location/radiation/quality/duration/timing/severity/associated sxs/prior Treatment) Patient is a 79 y.o. female presenting with neurologic complaint. The history is provided by the patient.  Neurologic Problem This is a new problem. The current episode started 3 to 5 hours ago. The problem occurs constantly. The problem has not changed since onset.Pertinent negatives include no chest pain, no abdominal pain, no headaches and no shortness of breath. Associated symptoms comments: Larey Seat from seated position . Nothing aggravates the symptoms. Nothing relieves the symptoms. She has tried nothing for the symptoms. The treatment provided no relief.    Past Medical History  Diagnosis Date  . Cataract   . Diabetes mellitus   . Cancer    Past Surgical History  Procedure Laterality Date  . Bladder surgery  06/1989  . Knee surgery  10/30/1993    Lt   . Back surgery  20/11/1998    ruptured disc  . Colon surgery    . Hernia repair     No family history on file. History  Substance Use Topics  . Smoking status: Never Smoker   . Smokeless tobacco: Never Used  . Alcohol Use: No   OB History   Grav Para Term Preterm Abortions TAB SAB Ect Mult Living                 Review of Systems  Constitutional: Negative for fever and fatigue.  HENT: Negative for congestion, drooling and neck pain.   Eyes: Negative for pain.  Respiratory: Negative for cough and shortness of breath.   Cardiovascular: Negative for chest pain.  Gastrointestinal: Negative for nausea, vomiting, abdominal pain and diarrhea.  Genitourinary: Negative for dysuria and hematuria.  Musculoskeletal: Negative for back pain and gait problem.  Skin: Negative for color change.  Neurological: Negative for dizziness and headaches.  Hematological: Negative for adenopathy.   Psychiatric/Behavioral: Negative for behavioral problems.  All other systems reviewed and are negative.    Allergies  Oxycodone; Ace inhibitors; Ciprofloxacin hcl; and Hydrocodone-acetaminophen  Home Medications   Current Outpatient Rx  Name  Route  Sig  Dispense  Refill  . aspirin 81 MG tablet   Oral   Take 81 mg by mouth daily.           Marland Kitchen buPROPion (WELLBUTRIN SR) 100 MG 12 hr tablet   Oral   Take 100 mg by mouth daily.          . carvedilol (COREG) 3.125 MG tablet   Oral   Take 3.125 mg by mouth 2 (two) times daily with a meal.           . furosemide (LASIX) 20 MG tablet   Oral   Take 20 mg by mouth 2 (two) times daily.           Marland Kitchen KLOR-CON M20 20 MEQ tablet               . lisinopril (PRINIVIL,ZESTRIL) 5 MG tablet   Oral   Take 5 mg by mouth daily.           . meloxicam (MOBIC) 15 MG tablet      daily.          . metFORMIN (GLUCOPHAGE) 500 MG tablet      2 (two) times daily.         Marland Kitchen  ondansetron (ZOFRAN) 8 MG tablet   Oral   Take 1 tablet (8 mg total) by mouth every 12 (twelve) hours as needed for nausea.   60 tablet   1   . ondansetron (ZOFRAN-ODT) 4 MG disintegrating tablet   Oral   Take 4 mg by mouth every 8 (eight) hours as needed for nausea.          . polyethylene glycol (MIRALAX / GLYCOLAX) packet   Oral   Take 17 g by mouth daily.         . potassium chloride (MICRO-K) 10 MEQ CR capsule   Oral   Take 10 mEq by mouth daily.         . prochlorperazine (COMPAZINE) 5 MG tablet   Oral   Take 1 tablet (5 mg total) by mouth every 6 (six) hours as needed for nausea.   30 tablet   0   . senna (SENOKOT) 8.6 MG tablet   Oral   Take 1 tablet by mouth as needed for constipation. Takes 3-4 if needed for constipation         . solifenacin (VESICARE) 5 MG tablet   Oral   Take 10 mg by mouth daily as needed.         . traMADol (ULTRAM) 50 MG tablet   Oral   Take 0.5-1 tablets (25-50 mg total) by mouth every 6 (six)  hours as needed for pain.   30 tablet   2   . venlafaxine (EFFEXOR) 75 MG tablet   Oral   Take 75 mg by mouth 1 day or 1 dose.           There were no vitals taken for this visit. Physical Exam  Nursing note and vitals reviewed. Constitutional: She is oriented to person, place, and time. She appears well-developed and well-nourished.  HENT:  Head: Normocephalic.  Mouth/Throat: No oropharyngeal exudate.  Dry oral mucous membranes.   2cm superficial, well approximated laceration to left posterior parietal area.   Eyes: Conjunctivae and EOM are normal. Pupils are equal, round, and reactive to light.  Neck: Normal range of motion. Neck supple.  Mid cervical ttp. No other vertebral ttp.   Cardiovascular: Normal heart sounds and intact distal pulses.  Exam reveals no gallop and no friction rub.   No murmur heard. Sinus tachycardia, HR 104  Pulmonary/Chest: Breath sounds normal. She is in respiratory distress (mild inc wob). She has no wheezes.  Abdominal: Soft. Bowel sounds are normal. There is no tenderness. There is no rebound and no guarding.  Musculoskeletal: Normal range of motion. She exhibits no edema and no tenderness.  Neurological: She is alert and oriented to person, place, and time.  LUE/LLE numbness. Slurring of speech.   Skin: Skin is warm and dry.  Psychiatric: She has a normal mood and affect. Her behavior is normal.    ED Course  LACERATION REPAIR Date/Time: 10/13/2012 1:00 PM Performed by: Purvis Sheffield, S Authorized by: Purvis Sheffield, S Consent: Verbal consent obtained. written consent not obtained. Risks and benefits: risks, benefits and alternatives were discussed Consent given by: patient and guardian Required items: required blood products, implants, devices, and special equipment available Patient identity confirmed: verbally with patient, arm band, provided demographic data and hospital-assigned identification number Time out: Immediately prior  to procedure a "time out" was called to verify the correct patient, procedure, equipment, support staff and site/side marked as required. Body area: head/neck Location details: scalp Laceration length: 2 cm Foreign bodies:  no foreign bodies Tendon involvement: none Nerve involvement: none Vascular damage: no Patient sedated: no Irrigation solution: saline (Also irrigated w/ shur-clens) Irrigation method: jet lavage Amount of cleaning: standard Debridement: none Degree of undermining: none Skin closure: glue Patient tolerance: Patient tolerated the procedure well with no immediate complications.   (including critical care time) Labs Review Labs Reviewed  CBC - Abnormal; Notable for the following:    RBC 3.30 (*)    Hemoglobin 6.8 (*)    HCT 24.0 (*)    MCV 72.7 (*)    MCH 20.6 (*)    MCHC 28.3 (*)    RDW 19.5 (*)    All other components within normal limits  COMPREHENSIVE METABOLIC PANEL - Abnormal; Notable for the following:    Glucose, Bld 147 (*)    Albumin 2.2 (*)    Total Bilirubin <0.1 (*)    GFR calc non Af Amer 77 (*)    GFR calc Af Amer 89 (*)    All other components within normal limits  URINALYSIS, ROUTINE W REFLEX MICROSCOPIC - Abnormal; Notable for the following:    APPearance HAZY (*)    Nitrite POSITIVE (*)    All other components within normal limits  GLUCOSE, CAPILLARY - Abnormal; Notable for the following:    Glucose-Capillary 128 (*)    All other components within normal limits  CBC - Abnormal; Notable for the following:    RBC 3.23 (*)    Hemoglobin 6.8 (*)    HCT 23.5 (*)    MCV 72.8 (*)    MCH 21.1 (*)    MCHC 28.9 (*)    RDW 19.6 (*)    All other components within normal limits  URINE MICROSCOPIC-ADD ON - Abnormal; Notable for the following:    Squamous Epithelial / LPF FEW (*)    Bacteria, UA MANY (*)    All other components within normal limits  POCT I-STAT, CHEM 8 - Abnormal; Notable for the following:    Glucose, Bld 144 (*)     Hemoglobin 8.2 (*)    HCT 24.0 (*)    All other components within normal limits  OCCULT BLOOD, POC DEVICE - Abnormal; Notable for the following:    Fecal Occult Bld POSITIVE (*)    All other components within normal limits  URINE CULTURE  ETHANOL  PROTIME-INR  APTT  DIFFERENTIAL  TROPONIN I  URINE RAPID DRUG SCREEN (HOSP PERFORMED)  OCCULT BLOOD X 1 CARD TO LAB, STOOL  POCT I-STAT TROPONIN I  TYPE AND SCREEN  PREPARE RBC (CROSSMATCH)   Imaging Review Dg Shoulder Right  10/13/2012   *RADIOLOGY REPORT*  Clinical Data: Right shoulder pain after fall.  RIGHT SHOULDER - 2+ VIEW  Comparison: None.  Findings: No fracture or dislocation is noted.  Visualized ribs appear normal.  Mild degenerative changes seen involving the right acromioclavicular joint.  There is severe degenerative change involving the right glenohumeral joint with deformity of right humeral head as a result.  Severe narrowing of the right acromiohumeral space is noted consistent with rotator cuff injury.  IMPRESSION: Severe degenerative joint disease of right glenohumeral joint is noted.  No acute fracture or dislocation is noted.  Severe narrowing of right acromiohumeral space is noted consistent with rotator cuff injury.   Original Report Authenticated By: Lupita Raider.,  M.D.   Ct Head Wo Contrast  10/13/2012   CLINICAL DATA:  Unwitnessed fall. Left-sided weakness with slurred speech and left facial droop. Code stroke.  EXAM:  CT HEAD WITHOUT CONTRAST  TECHNIQUE: Contiguous axial images were obtained from the base of the skull through the vertex without intravenous contrast.  COMPARISON:  None.  FINDINGS: The brainstem, cerebellum, cerebral peduncles, thalamus, basal ganglia, basilar cisterns, and ventricular system appear within normal limits. Periventricular white matter and corona radiata hypodensities favor chronic ischemic microvascular white matter disease. No intracranial hemorrhage, mass lesion, or acute CVA. Left  parietal scalp hematoma observed. There is chronic sphenoid sinusitis.  IMPRESSION: 1. Chronic sphenoid sinusitis. No acute intracranial findings. 2. Periventricular white matter and corona radiata hypodensities favor chronic ischemic microvascular white matter disease. 3. Left parietal scalp hematoma.  These results were called by telephone at the time of interpretation on 10/13/2012 at 9:08 AM to Dr. Thad Ranger, who verbally acknowledged these results.   Electronically Signed   By: Herbie Baltimore   On: 10/13/2012 09:13   Ct Cervical Spine Wo Contrast  10/13/2012   *RADIOLOGY REPORT*  Clinical Data: Fall  CT CERVICAL SPINE WITHOUT CONTRAST  Technique:  Multidetector CT imaging of the cervical spine was performed. Multiplanar CT image reconstructions were also generated.  Comparison: 05/01/2007  Findings:  Axial images shows no acute fracture or subluxation. There is diffuse osteopenia.  There is no pneumothorax in visualized lung apices.  Loculated small right pleural effusion noted posteriorly.  Atherosclerotic calcifications of the thoracic aorta.  Computer processed images shows no acute fracture or subluxation. Degenerative changes are noted C1-C2 articulation.  Mild disc space flattening at C3-C4 level.  Mild to moderate disc space flattening with anterior spurring and mild posterior spurring at C4-C5 level. Significant disc space flattening with anterior spurring and vacuum disc phenomenon at C5-C6 level.  Significant disc space flattening with anterior spurring and partial bony fusion at C6-C7 level. Partial bony fusion noted at C 7 T1 vertebral body.  Disc space flattening with anterior spurring noted at T1 T2 level.  Mild spinal canal stenosis due to posterior spurring at C5-C6 level.  No prevertebral soft tissue swelling.  Cervical airway is patent.  IMPRESSION: No acute fracture or subluxation.  Diffuse osteopenia.  Multilevel degenerative changes as described above.  Small loculated right pleural  effusion.   Original Report Authenticated By: Natasha Mead, M.D.   Dg Pelvis Portable  10/13/2012   *RADIOLOGY REPORT*  Clinical Data: Larey Seat.  Right hip pain.  PORTABLE PELVIS  Comparison: 07/14/2012.  Findings: Both hips are normally located.  Advanced degenerative joint disease bilaterally.  No definite acute hip fracture.  The pubic symphysis demonstrates moderate degenerative changes.  No definite pubic rami fractures.  The SI joints are intact.  Mild degenerative changes.  No definite pelvic fractures.  Advanced degenerative changes noted in the lower lumbar spine could  IMPRESSION: Degenerative changes but no definite acute hip or pelvic fracture.   Original Report Authenticated By: Rudie Meyer, M.D.   Dg Chest Portable 1 View  10/13/2012   *RADIOLOGY REPORT*  Clinical Data: Fall, stroke  PORTABLE CHEST - 1 VIEW  Comparison: 07/14/2012  Findings: Cardiomediastinal silhouette is stable.  Mild emphysematous changes.  No pulmonary edema.  There is hazy right base medially atelectasis or infiltrate.  Extensive degenerative changes bilateral shoulders.  IMPRESSION:  Mild emphysematous changes.  No pulmonary edema.  There is hazy right base medially atelectasis or infiltrate.  Extensive degenerative changes bilateral shoulders.   Original Report Authenticated By: Natasha Mead, M.D.     Date: 10/13/2012  Rate: 104  Rhythm: sinus tachycardia  QRS Axis: borderline LAD  Intervals: normal  ST/T Wave abnormalities: nonspecific ST/T changes  Conduction Disutrbances:none  Narrative Interpretation: sinus tachycardia, artifact in lateral leads, No new ST/T wave changes cw ischemia  Old EKG Reviewed: unchanged   MDM   1. CVA (cerebral infarction)   2. UTI (lower urinary tract infection)   3. Stroke   4. Ascites, malignant   5. GI bleed   6. Anemia    8:51 AM 79 y.o. female w hx of metastatic adenocarcinoma w/ weekly paracentesis pw fall and left sided weakness. Last seen normal at 5:30 am, pt fell off  the toilet shortly after that. Was found to have facial droop, left sided weakness, and slurring of speech. Pt w/ mild inc wob on exam, but supine on arrival w/ ascites.   Repaired lac on head w/ dermabond. Hgb found to be low on repeat, 6.8. Will transfuse 1 unit regular RBC's as pt has not been on chem since jan '14. Will order protonix   Junius Argyle, MD 10/13/12 [phone]

## 2012-10-13 NOTE — ED Notes (Signed)
Neuro and stroke team remain at the bedside.

## 2012-10-13 NOTE — Consult Note (Signed)
Referring Physician: Romeo Apple    Chief Complaint: Left sided weakness, slurredspeech  HPI: [name] is an 79 y.o. female who awakened early this morning with pain.  Was normal at that time.  Later this morning when she got up to use the bathroom she had difficulty getting her pants down.  She began to feel poorly and then fell.  When her aide found her she was on the floor she  had a left facial droop and was weak on the left side.  EMS was called and the patient was brought in as a code stroke.  Initial NIHSS of 8. Patient has metastatic cancer of the appendix.  Has paracentesis weekly.  Is able to walk and talk independently buy is on hospice.  Date last known well: Date: 10/13/2012 Time last known well: Time: 05:30 tPA Given: No: Outside time window  Past Medical History  Diagnosis Date  . Cataract   . Diabetes mellitus   . Cancer     Past Surgical History  Procedure Laterality Date  . Bladder surgery  06/1989  . Knee surgery  10/30/1993    Lt   . Back surgery  20/11/1998    ruptured disc  . Colon surgery    . Hernia repair      Family history: Mother wit multiple strokes.  Now deceased  Social History:  reports that she has never smoked. She has never used smokeless tobacco. She reports that she does not drink alcohol. Her drug history is not on file.  Allergies:  Allergies  Allergen Reactions  . Oxycodone Other (See Comments)    hallucinations  . Ace Inhibitors   . Ciprofloxacin Hcl   . Hydrocodone-Acetaminophen Other (See Comments)    Reports made her "crazy"    Medications: I have reviewed the patient's current medications. Prior to Admission:  No current facility-administered medications for this encounter. Current outpatient prescriptions:aspirin 81 MG tablet, Take 81 mg by mouth daily.  , Disp: , Rfl: ;  buPROPion (WELLBUTRIN SR) 100 MG 12 hr tablet, Take 100 mg by mouth daily. , Disp: , Rfl: ;  carvedilol (COREG) 3.125 MG tablet, Take 3.125 mg by mouth  2 (two) times daily with a meal.  , Disp: , Rfl: ;  furosemide (LASIX) 20 MG tablet, Take 20 mg by mouth 2 (two) times daily.  , Disp: , Rfl: ;  KLOR-CON M20 20 MEQ tablet, , Disp: , Rfl:  lisinopril (PRINIVIL,ZESTRIL) 5 MG tablet, Take 5 mg by mouth daily.  , Disp: , Rfl: ;  meloxicam (MOBIC) 15 MG tablet, daily. , Disp: , Rfl: ;  metFORMIN (GLUCOPHAGE) 500 MG tablet, 2 (two) times daily., Disp: , Rfl: ;  ondansetron (ZOFRAN) 8 MG tablet, Take 1 tablet (8 mg total) by mouth every 12 (twelve) hours as needed for nausea., Disp: 60 tablet, Rfl: 1 ondansetron (ZOFRAN-ODT) 4 MG disintegrating tablet, Take 4 mg by mouth every 8 (eight) hours as needed for nausea. , Disp: , Rfl: ;  polyethylene glycol (MIRALAX / GLYCOLAX) packet, Take 17 g by mouth daily., Disp: , Rfl: ;  potassium chloride (MICRO-K) 10 MEQ CR capsule, Take 10 mEq by mouth daily., Disp: , Rfl:  prochlorperazine (COMPAZINE) 5 MG tablet, Take 1 tablet (5 mg total) by mouth every 6 (six) hours as needed for nausea., Disp: 30 tablet, Rfl: 0;  senna (SENOKOT) 8.6 MG tablet, Take 1 tablet by mouth as needed for constipation. Takes 3-4 if needed for constipation, Disp: , Rfl: ;  solifenacin (VESICARE) 5 MG tablet, Take 10 mg by mouth daily as needed., Disp: , Rfl:  traMADol (ULTRAM) 50 MG tablet, Take 0.5-1 tablets (25-50 mg total) by mouth every 6 (six) hours as needed for pain., Disp: 30 tablet, Rfl: 2;  venlafaxine (EFFEXOR) 75 MG tablet, Take 75 mg by mouth 1 day or 1 dose. , Disp: , Rfl:   ROS: History obtained from the patient  General ROS: negative for - chills, fatigue, fever, night sweats, weight gain or weight loss Psychological ROS: negative for - behavioral disorder, hallucinations, memory difficulties, mood swings or suicidal ideation Ophthalmic ROS: negative for - blurry vision, double vision, eye pain or loss of vision ENT ROS: negative for - epistaxis, nasal discharge, oral lesions, sore throat, tinnitus or vertigo Allergy and  Immunology ROS: negative for - hives or itchy/watery eyes Hematological and Lymphatic ROS: negative for - bleeding problems, bruising or swollen lymph nodes Endocrine ROS: negative for - galactorrhea, hair pattern changes, polydipsia/polyuria or temperature intolerance Respiratory ROS: chest pain Cardiovascular ROS: negative for - chest pain, dyspnea on exertion, edema or irregular heartbeat Gastrointestinal ROS: abdominal fluid Genito-Urinary ROS: negative for - dysuria, hematuria, incontinence or urinary frequency/urgency Musculoskeletal ROS: negative for - joint swelling or muscular weakness Neurological ROS: as noted in HPI Dermatological ROS: negative for rash and skin lesion changes  Physical Examination: Blood pressure 135/59, pulse 106, resp. rate 18, SpO2 97.00%.  Neurologic Examination: Mental Status: Alert, oriented, thought content appropriate.  Speech fluent without evidence of aphasia but slurred.  Able to follow 3 step commands without difficulty.  Questionable left neglect Cranial Nerves: II: Discs flat bilaterally; Visual fields grossly normal, pupils equal, round, reactive to light and accommodation III,IV, VI: ptosis not present, extra-ocular motions intact bilaterally V,VII: left facial droop, facial light touch sensation decreased on the left VIII: hearing decreased bilaterally IX,X: gag reflex present XI: shoulder shrug difficult to access secondary to pain XII: midline tongue extension Motor: Right : Upper extremity   5/5    Left:     Upper extremity   4/5  Lower extremity   5/5     Lower extremity   4+/5; external rotation noted Tone and bulk:normal tone throughout; no atrophy noted Sensory: Pinprick and light touch intact decreased on the left Deep Tendon Reflexes: 2+ and symmetric with absent AJ's bilaterally Plantars: Right: downgoing   Left: upgoing Cerebellar: normal finger-to-nose and normal heel-to-shin test Gait: not tested CV: pulses palpable  throughout    Laboratory Studies:  Basic Metabolic Panel:  Recent Labs Lab 10/13/12 0906  NA 142  K 4.2  CL 103  GLUCOSE 144*  BUN 17  CREATININE 0.80    Liver Function Tests: No results found for this basename: AST, ALT, ALKPHOS, BILITOT, PROT, ALBUMIN,  in the last 168 hours No results found for this basename: LIPASE, AMYLASE,  in the last 168 hours No results found for this basename: AMMONIA,  in the last 168 hours  CBC:  Recent Labs Lab 10/13/12 0900 10/13/12 0906  WBC 8.7  --   NEUTROABS PENDING  --   HGB 6.8* 8.2*  HCT 24.0* 24.0*  MCV 72.7*  --   PLT 381  --     Cardiac Enzymes: No results found for this basename: CKTOTAL, CKMB, CKMBINDEX, TROPONINI,  in the last 168 hours  BNP: No components found with this basename: POCBNP,   CBG:  Recent Labs Lab 10/13/12 0908  GLUCAP 128*    Microbiology: No results found for this  or any previous visit.  Coagulation Studies:  Recent Labs  10/13/12 0900  LABPROT 12.3  INR 0.93    Urinalysis: No results found for this basename: COLORURINE, APPERANCEUR, LABSPEC, PHURINE, GLUCOSEU, HGBUR, BILIRUBINUR, KETONESUR, PROTEINUR, UROBILINOGEN, NITRITE, LEUKOCYTESUR,  in the last 168 hours  Lipid Panel: No results found for this basename: chol,  trig,  hdl,  cholhdl,  vldl,  ldlcalc    HgbA1C:  No results found for this basename: HGBA1C    Urine Drug Screen:   No results found for this basename: labopia,  cocainscrnur,  labbenz,  amphetmu,  thcu,  labbarb    Alcohol Level: No results found for this basename: ETH,  in the last 168 hours  Other results: EKG: sinus tachycardia at 99 bpm  Imaging: Ct Head Wo Contrast  10/13/2012   CLINICAL DATA:  Unwitnessed fall. Left-sided weakness with slurred speech and left facial droop. Code stroke.  EXAM: CT HEAD WITHOUT CONTRAST  TECHNIQUE: Contiguous axial images were obtained from the base of the skull through the vertex without intravenous contrast.  COMPARISON:   None.  FINDINGS: The brainstem, cerebellum, cerebral peduncles, thalamus, basal ganglia, basilar cisterns, and ventricular system appear within normal limits. Periventricular white matter and corona radiata hypodensities favor chronic ischemic microvascular white matter disease. No intracranial hemorrhage, mass lesion, or acute CVA. Left parietal scalp hematoma observed. There is chronic sphenoid sinusitis.  IMPRESSION: 1. Chronic sphenoid sinusitis. No acute intracranial findings. 2. Periventricular white matter and corona radiata hypodensities favor chronic ischemic microvascular white matter disease. 3. Left parietal scalp hematoma.  These results were called by telephone at the time of interpretation on 10/13/2012 at 9:08 AM to Dr. Thad Ranger, who verbally acknowledged these results.   Electronically Signed   By: Herbie Baltimore   On: 10/13/2012 09:13    Assessment: 79 y.o. female presenting after a fall with left sided weakness and slurred speech.  On examination left sided weakness and sensory loss noted.  Speech slurred.  CT reviewed and shows no acute changes.  Patient not eligible for tPA due to being outside of the treatment window.  Acute ischemic disease likely.  Further work up recommended.  Patient on no antiplatelet therapy.  Stroke Risk Factors - diabetes mellitus  Plan: 1. HgbA1c, fasting lipid panel 2. MRI, MRA  of the brain without contrast 3. PT consult, OT consult, Speech consult 4. Echocardiogram 5. Carotid dopplers 6. Prophylactic therapy-Antiplatelet med: Aspirin - dose 81mg  daily 7. Risk factor modification 8. Telemetry monitoring 9. Frequent neuro checks  Case discussed with Dr. Marlowe Shores, MD [facility] [phone] 10/13/2012, 9:38 AM

## 2012-10-13 NOTE — ED Notes (Signed)
EDP informed of Hgb level

## 2012-10-13 NOTE — Progress Notes (Signed)
Nutrition Brief Note  Chart reviewed. Patient with PMH of metastatic adenocarcinoma; admitted s/p fall and slurred speech. Patient receives Hospice care at home; Palliative Care Team consulted today. Please consult RD as needed.   Maureen Chatters, RD, LDN Pager #: [phone] After-Hours Pager #: [phone]

## 2012-10-13 NOTE — ED Notes (Signed)
CBG 128 

## 2012-10-13 NOTE — Procedures (Signed)
US guided therapeutic paracentesis performed yielding 3.9 liters bloody fluid. No immediate complications.

## 2012-10-13 NOTE — ED Notes (Signed)
Pt removed from the LSB by EDP

## 2012-10-14 ENCOUNTER — Inpatient Hospital Stay (HOSPITAL_COMMUNITY)

## 2012-10-14 DIAGNOSIS — D649 Anemia, unspecified: Secondary | ICD-10-CM

## 2012-10-14 DIAGNOSIS — I6350 Cerebral infarction due to unspecified occlusion or stenosis of unspecified cerebral artery: Secondary | ICD-10-CM

## 2012-10-14 DIAGNOSIS — C181 Malignant neoplasm of appendix: Secondary | ICD-10-CM

## 2012-10-14 DIAGNOSIS — R180 Malignant ascites: Secondary | ICD-10-CM

## 2012-10-14 LAB — GLUCOSE, CAPILLARY
Glucose-Capillary: 145 mg/dL — ABNORMAL HIGH (ref 70–99)
Glucose-Capillary: 88 mg/dL (ref 70–99)
Glucose-Capillary: 99 mg/dL (ref 70–99)

## 2012-10-14 LAB — CBC
Hemoglobin: 7 g/dL — ABNORMAL LOW (ref 12.0–15.0)
MCH: 21.9 pg — ABNORMAL LOW (ref 26.0–34.0)
RBC: 3.19 MIL/uL — ABNORMAL LOW (ref 3.87–5.11)
WBC: 6 10*3/uL (ref 4.0–10.5)

## 2012-10-14 LAB — LIPID PANEL
Cholesterol: 121 mg/dL (ref 0–200)
LDL Cholesterol: 59 mg/dL (ref 0–99)
VLDL: 23 mg/dL (ref 0–40)

## 2012-10-14 MED ORDER — ASPIRIN 300 MG RE SUPP
300.0000 mg | Freq: Every day | RECTAL | Status: DC
  Filled 2012-10-14: qty 1

## 2012-10-14 MED ORDER — ASPIRIN 81 MG PO CHEW
CHEWABLE_TABLET | ORAL | Status: AC
  Administered 2012-10-14: 324 mg
  Filled 2012-10-14: qty 4

## 2012-10-14 MED ORDER — FUROSEMIDE 10 MG/ML IJ SOLN
INTRAMUSCULAR | Status: AC
  Administered 2012-10-14: 20 mg via INTRAVENOUS
  Filled 2012-10-14: qty 4

## 2012-10-14 MED ORDER — BIOTENE DRY MOUTH MT LIQD
15.0000 mL | Freq: Two times a day (BID) | OROMUCOSAL | Status: DC
  Administered 2012-10-14 – 2012-10-18 (×5): 15 mL via OROMUCOSAL

## 2012-10-14 MED ORDER — ASPIRIN 325 MG PO TABS
325.0000 mg | ORAL_TABLET | Freq: Every day | ORAL | Status: DC
  Administered 2012-10-15 – 2012-10-18 (×4): 325 mg via ORAL
  Filled 2012-10-14 (×4): qty 1

## 2012-10-14 MED ORDER — FUROSEMIDE 10 MG/ML IJ SOLN
20.0000 mg | Freq: Once | INTRAMUSCULAR | Status: AC
  Administered 2012-10-14: 20 mg via INTRAVENOUS

## 2012-10-14 NOTE — Procedures (Addendum)
Objective Swallowing Evaluation: Modified Barium Swallowing Study  Patient Details  Name: [name] MRN: [id number] Date of Birth: 1922/07/10  Today's Date: 10/14/2012 Time: 4098-1191 SLP Time Calculation (min): 33 min  Past Medical History:  Past Medical History  Diagnosis Date  . Cataract   . Diabetes mellitus   . Cancer    Past Surgical History:  Past Surgical History  Procedure Laterality Date  . Bladder surgery  06/1989  . Knee surgery  10/30/1993    Lt   . Back surgery  20/11/1998    ruptured disc  . Colon surgery    . Hernia repair     HPI:  [name] is a 79 y.o. female with MPH of Metastatic adenocarcinoma -carcinomatosis Dx- 2012 (was previous on chemo-xeloda, pantimumamab, but stopped due to progression of cancer/hospice), malignant ascites on weekly paracentesis, DM, HTN, OSA, depression, chronic cancer related pain who woke up  early this morning with pain. Later she got up to use the bathroom she had some difficulty then fell. When her aide found her she was on the floor she had a left facial droop and was weak on the left side. EMS was called and the patient was brought in as a code stroke. Initial NIHSS of 8.  MBS ordered following results of BSE.     Assessment / Plan / Recommendation Clinical Impression  Dysphagia Diagnosis: Moderate oral phase dysphagia;Mild pharyngeal phase dysphagia;Moderate cervical esophageal phase dysphagia Moderate sensory motor oral dysphagia marked by labial and lingual, buccal weakness on left with decreased lingual coordination.  Xerostomia noted.  Slow oral transit with piecemeal swallows with all consistencies.  Moderate sensory motor pharyngeal dysphagia marked by delay in initiation, decreased TBR, decreased laryngeal elevation,  and  reduced pharyngeal peristalsis.  Deep penetration during swallow of thin liquid barium by straw with delayed throat clear.  Patient unable to clear penetrates with instructed cough.  Modified   Cup sips effective in eliminating further penetration.  Minimal residue in vallecular space, posterior pharyngeal wall and pyriforms with puree and mechanical soft consistencies but cleared with spontaneous second swallow. Brief esophageal sweep indicates  moderate to severe cervical esophageal dysphagia. No radiologist present to confirm.   Diffuse stasis with noted backflow to cervical portion with soft solids. Whole barium tablet retained in thoracic portion requiring sips of thin liquid and dry swallows to continue bolus transit.  Recommend to initiate dysphagia 2 diet consistency with thin liquids by cup sips only with total assist with each meal to provide cues as needed.  Recommend aspiration and strict reflux precautions as aspiration risk remains high s/p swallow.  Recommend to crush medication as able  and administer in puree consistency.  Diagnostic treatment completed s/p evaluation focusing on providing diet recommendations and strategies to improve safety to family members, patient, and caregivers.  Patient may benefit from GI consult to further assess esophageal functioning.  ST to follow closely in acute care setting to monitor for aspiration and for diet tolerance.  Defer diet advancement to treating SLP with clinical improvement.         Diet Recommendation Dysphagia 2 (Fine chop);Thin liquid   Liquid Administration via: Cup;No straw Medication Administration: Crushed with puree Supervision: Full supervision/cueing for compensatory strategies;Staff feed patient Compensations: Slow rate;Small sips/bites;Check for pocketing;Multiple dry swallows after each bite/sip Postural Changes and/or Swallow Maneuvers: Seated upright 90 degrees;Upright 30-60 min after meal    Other  Recommendations Recommended Consults: Consider GI evaluation Oral Care Recommendations: Oral care before and  after PO Other Recommendations: Clarify dietary restrictions   Follow Up Recommendations  Inpatient Rehab     Frequency and Duration min 2x/week  2 weeks       SLP Swallow Goals Patient will consume recommended diet without observed clinical signs of aspiration with: Minimal assistance Patient will utilize recommended strategies during swallow to increase swallowing safety with: Minimal assistance   General Date of Onset: 10/13/12 HPI: [name] is a 79 y.o. female with MPH of Metastatic adenocarcinoma -carcinomatosis Dx- 2012 (was previous on chemo-xeloda, pantimumamab, but stopped due to progression of cancer/hospice), malignant ascites on weekly paracentesis, DM, HTN, OSA, depression, chronic cancer related pain who woke up  early this morning with pain. Later she got up to use the bathroom she had some difficulty then fell. When her aide found her she was on the floor she had a left facial droop and was weak on the left side. EMS was called and the patient was brought in as a code stroke. Initial NIHSS of 8. Type of Study: Modified Barium Swallowing Study Reason for Referral: Objectively evaluate swallowing function Previous Swallow Assessment: BSE 10/14/12 NPO  Diet Prior to this Study: NPO Temperature Spikes Noted: No Respiratory Status: Supplemental O2 delivered via (comment) (nasal cannula ) History of Recent Intubation: No Behavior/Cognition: Alert;Cooperative;Pleasant mood Oral Cavity - Dentition: Adequate natural dentition Oral Motor / Sensory Function: Impaired - see Bedside swallow eval Self-Feeding Abilities: Needs assist Patient Positioning: Upright in chair Baseline Vocal Quality: Breathy;Low vocal intensity Volitional Cough: Strong Volitional Swallow: Able to elicit Anatomy: Within functional limits Pharyngeal Secretions: Not observed secondary MBS    Reason for Referral Objectively evaluate swallowing function   Oral Phase Oral Preparation/Oral Phase Oral Phase: Impaired Oral - Nectar Oral - Nectar Teaspoon: Weak lingual manipulation;Incomplete tongue to palate  contact;Reduced posterior propulsion;Piecemeal swallowing;Lingual/palatal residue;Delayed oral transit Oral - Nectar Cup: Weak lingual manipulation;Incomplete tongue to palate contact;Reduced posterior propulsion;Lingual/palatal residue;Piecemeal swallowing;Delayed oral transit Oral - Nectar Straw: Weak lingual manipulation;Lingual/palatal residue;Piecemeal swallowing Oral - Thin Oral - Thin Teaspoon: Lingual pumping;Incomplete tongue to palate contact;Reduced posterior propulsion;Lingual/palatal residue;Piecemeal swallowing;Delayed oral transit Oral - Thin Cup: Weak lingual manipulation;Reduced posterior propulsion;Incomplete tongue to palate contact;Lingual/palatal residue;Delayed oral transit Oral - Thin Straw: Weak lingual manipulation;Reduced posterior propulsion;Lingual/palatal residue;Piecemeal swallowing;Delayed oral transit Oral - Solids Oral - Puree: Weak lingual manipulation;Lingual pumping;Reduced posterior propulsion;Lingual/palatal residue;Piecemeal swallowing;Delayed oral transit Oral - Mechanical Soft: Impaired mastication;Reduced posterior propulsion;Piecemeal swallowing;Lingual/palatal residue;Delayed oral transit;Weak lingual manipulation   Pharyngeal Phase Pharyngeal Phase Pharyngeal Phase: Impaired Pharyngeal - Nectar Pharyngeal - Nectar Teaspoon: Premature spillage to valleculae;Reduced tongue base retraction;Reduced laryngeal elevation;Pharyngeal residue - valleculae;Reduced pharyngeal peristalsis Pharyngeal - Nectar Cup: Premature spillage to valleculae;Premature spillage to pyriform sinuses;Delayed swallow initiation;Reduced pharyngeal peristalsis;Reduced laryngeal elevation;Reduced tongue base retraction;Pharyngeal residue - posterior pharnyx;Pharyngeal residue - pyriform sinuses Pharyngeal - Nectar Straw: Premature spillage to pyriform sinuses;Reduced pharyngeal peristalsis;Delayed swallow initiation;Reduced tongue base retraction;Reduced laryngeal elevation;Pharyngeal  residue - posterior pharnyx;Pharyngeal residue - pyriform sinuses;Pharyngeal residue - valleculae Pharyngeal - Thin Pharyngeal - Thin Teaspoon: Premature spillage to valleculae;Premature spillage to pyriform sinuses;Delayed swallow initiation;Reduced pharyngeal peristalsis;Reduced tongue base retraction;Pharyngeal residue - valleculae;Pharyngeal residue - posterior pharnyx;Pharyngeal residue - pyriform sinuses Pharyngeal - Thin Cup: Premature spillage to pyriform sinuses;Delayed swallow initiation;Reduced pharyngeal peristalsis;Reduced laryngeal elevation;Reduced tongue base retraction Pharyngeal - Thin Straw: Premature spillage to pyriform sinuses;Reduced pharyngeal peristalsis;Delayed swallow initiation;Reduced tongue base retraction;Penetration/Aspiration during swallow;Pharyngeal residue - valleculae;Pharyngeal residue - pyriform sinuses Penetration/Aspiration details (thin straw): Material enters airway, CONTACTS cords and not ejected out Pharyngeal -  Solids Pharyngeal - Puree: Premature spillage to pyriform sinuses;Reduced pharyngeal peristalsis;Delayed swallow initiation;Reduced laryngeal elevation;Reduced tongue base retraction;Pharyngeal residue - valleculae;Pharyngeal residue - posterior pharnyx;Pharyngeal residue - pyriform sinuses Pharyngeal - Mechanical Soft: Premature spillage to pyriform sinuses;Reduced pharyngeal peristalsis;Delayed swallow initiation;Reduced laryngeal elevation;Reduced tongue base retraction;Pharyngeal residue - valleculae;Pharyngeal residue - posterior pharnyx;Pharyngeal residue - pyriform sinuses Pharyngeal - Pill: Premature spillage to valleculae  Cervical Esophageal Phase    GO    Cervical Esophageal Phase Cervical Esophageal Phase: Impaired Cervical Esophageal Phase - Solids Puree: Prominent cricopharyngeal segment;Esophageal backflow into cervical esophagus Mechanical Soft: Esophageal backflow into cervical esophagus;Prominent cricopharyngeal  segment Pill: Esophageal backflow into cervical esophagus;Prominent cricopharyngeal segment        Moreen Fowler MS, CCC-SLP [phone] [facility] 10/14/2012, 5:37 PM

## 2012-10-14 NOTE — Evaluation (Signed)
Occupational Therapy Evaluation Patient Details Name: [name] MRN: [id number] DOB: 17-Jan-1923 Today's Date: 10/14/2012 Time: 1478-2956 OT Time Calculation (min): 35 min  OT Assessment / Plan / Recommendation History of present illness [name] is an 79 y.o. female who awakened early morning of 8/29. Was normal at that time. Later this morning when she got up to use the bathroom she had difficulty getting her pants down. She began to feel poorly and then fell. When her aide found her she was on the floor she had a left facial droop and was weak on the left side. EMS was called and the patient was brought in as a code stroke. Initial NIHSS of 8.  await mri   Clinical Impression   Pt admitted with above. Pt currently with functional limitations due to the deficits listed below (see OT Problem List), with left sided decreased proprioception and sensation decreasing her prior level of independence. Pt will benefit from skilled OT to increase their safety and independence with ADL and functional mobility for ADL to facilitate discharge to venue listed below.       OT Assessment  Patient needs continued OT Services    Follow Up Recommendations  CIR       Equipment Recommendations  None recommended by OT       Frequency  Min 3X/week    Precautions / Restrictions Precautions Precautions: Fall Precaution Comments: decreased proprioception and sensation LUE/trunk Restrictions Weight Bearing Restrictions: No       ADL  Eating/Feeding: NPO Grooming: Wash/dry hands;Moderate assistance Where Assessed - Grooming: Unsupported standing Upper Body Bathing: Moderate assistance Where Assessed - Upper Body Bathing: Supported sitting Lower Body Bathing: Maximal assistance Where Assessed - Lower Body Bathing: Supported sit to stand Upper Body Dressing: Maximal assistance Where Assessed - Upper Body Dressing: Supported sitting Lower Body Dressing: +1 Total assistance Where Assessed -  Lower Body Dressing: Supported sit to Pharmacist, hospital: +2 Total assistance Toilet Transfer: Patient Percentage: 50% Statistician Method: Sit to Barista: Regular height toilet;Grab bars Toileting - Clothing Manipulation and Hygiene: Moderate assistance Where Assessed - Toileting Clothing Manipulation and Hygiene: Sit to stand from 3-in-1 or toilet Equipment Used: Rolling walker;Gait belt (RW unsucessful--had to do Bil HHA due to LUE) Transfers/Ambulation Related to ADLs: Min A sit<>stand; total A +2 (pt=50%) Bil HHA    OT Diagnosis: Generalized weakness;Hemiplegia non-dominant side  OT Problem List: Decreased strength;Decreased range of motion;Impaired balance (sitting and/or standing);Decreased activity tolerance;Decreased safety awareness;Decreased knowledge of use of DME or AE;Impaired UE functional use;Impaired sensation OT Treatment Interventions: Self-care/ADL training;Balance training;Therapeutic activities;DME and/or AE instruction;Patient/family education   OT Goals(Current goals can be found in the care plan section) Acute Rehab OT Goals Patient Stated Goal: return home with spouse OT Goal Formulation: With patient/family Time For Goal Achievement: 10/28/12 Potential to Achieve Goals: Good  Visit Information  Last OT Received On: 10/14/12 Assistance Needed: +2 (safety with gait) History of Present Illness: Breanna Peeleris an 79 y.o. female who awakened early morning of 8/29. Was normal at that time. Later this morning when she got up to use the bathroom she had difficulty getting her pants down. She began to feel poorly and then fell. When her aide found her she was on the floor she had a left facial droop and was weak on the left side. EMS was called and the patient was brought in as a code stroke. Initial NIHSS of 8.  await mri  Prior Functioning     Home Living Family/patient expects to be discharged to:: Inpatient rehab Living  Arrangements: Spouse/significant other Available Help at Discharge: Personal care attendant;Available 24 hours/day (spouse's aide 24. pt aide for bathing and dressing) Type of Home: House Home Access: Ramped entrance Home Layout: One level Home Equipment: Walker - 2 wheels;Shower seat;Walker - 4 wheels;Bedside commode Prior Function Level of Independence: Needs assistance Gait / Transfers Assistance Needed: ambulates with Rollator ADL's / Homemaking Assistance Needed: assist for bathing 2x/wk and with items dressing like her bra Communication Communication: HOH Dominant Hand: Right         Vision/Perception Vision - History Patient Visual Report: No change from baseline   Cognition  Cognition Arousal/Alertness: Awake/alert Behavior During Therapy: WFL for tasks assessed/performed Overall Cognitive Status: Impaired/Different from baseline Area of Impairment: Safety/judgement Safety/Judgement: Decreased awareness of safety;Decreased awareness of deficits General Comments: Unaware where her LUE is in space after she places it somewhere (quits visually attending to it)    Extremity/Trunk Assessment Upper Extremity Assessment Upper Extremity Assessment: RUE deficits/detail;LUE deficits/detail RUE Deficits / Details: Decreased shoulder ROM--long standing; 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 RUE Coordination: decreased gross motor LUE Deficits / Details: Decreased shoulder ROM--long standing LUE Sensation: decreased light touch;decreased proprioception LUE Coordination: decreased fine motor;decreased gross motor Lower Extremity Assessment Lower Extremity Assessment: Generalized weakness (both grossly 3/5 hip flexion, knee flexion and extension) Cervical / Trunk Assessment Cervical / Trunk Assessment: Kyphotic     Mobility Bed Mobility Bed Mobility: Supine to Sit;Sitting - Scoot to Edge of Bed Supine to Sit: 3: Mod assist;HOB flat;With rails Sitting - Scoot to Edge of Bed: 4: Min guard Details for Bed  Mobility Assistance: increased assist to elevate trunk from surface and cueing to scoot Transfers Sit to Stand: From chair/3-in-1;From bed;4: Min assist Stand to Sit: To chair/3-in-1;4: Min assist Details for Transfer Assistance: cueing for hand placement, body position and safety with transfers        Balance Balance Balance Assessed: Yes Static Sitting Balance Static Sitting - Balance Support: Bilateral upper extremity supported;Feet supported Static Sitting - Level of Assistance: 4: Min assist Static Sitting - Comment/# of Minutes: 4, cueing for midline posture due to left lean Static Standing Balance Static Standing - Balance Support: Bilateral upper extremity supported Static Standing - Level of Assistance: 4: Min assist Static Standing - Comment/# of Minutes: 2   End of Session OT - End of Session Equipment Utilized During Treatment: Gait belt Activity Tolerance: Patient limited by fatigue Patient left: in chair;with call bell/phone within reach;with family/visitor present    Evette Georges [phone] 10/14/2012, 10:53 AM

## 2012-10-14 NOTE — Code Documentation (Signed)
79 year old female presents to [facility] as Code Stroke via [facility].  Code stroke was called in the field at 0831 - EDP arrival at [phone] - was met at the bridge at [phone] by EDP who cleared her for CT scan - stroke team arrived at [phone].  LSW 0530.  EMS reports patient has caregiver who saw her at her normal baseline at 0530 this morning.  Around 0815 the patient walked to the bathroom - had difficulty pulling her pants down and the fell onto the floor.  The caregiver heard her call for help and found the patient on the floor with left side facial droop and slurred speech.  The patient reports she sat down and fell backwards hitting the back of her head. On arrival patient alert - answering questions - slurred speech - left facial droop and left side weakness.  To CT scan.  NIHSS 08.  Dr. Thad Ranger present.  EDP present.  Daughter is RN - reports patient has metastatic CA and is under hospice care with a DNR status.  Dr. Thad Ranger speaking in depth with daughter.  Patient outside window for acute treatment.  EDP updated by Dr. Thad Ranger.  Handoff to [employer].

## 2012-10-14 NOTE — Progress Notes (Signed)
Stroke Team Progress Note  HISTORY [name] an 79 y.o. female who awakened early morning of 8/29. Was normal at that time. Later this morning when she got up to use the bathroom she had difficulty getting her pants down. She began to feel poorly and then fell. When her aide found her she was on the floor she had a left facial droop and was weak on the left side. EMS was called and the patient was brought in as a code stroke. Initial NIHSS of 8.  Patient has metastatic cancer of the appendix. Has paracentesis weekly. Is able to walk and talk independently buy is on hospice.   Patient was not a TPA candidate secondary to being outside the window. She was admitted to neuro unit for monitoring. SUBJECTIVE Her daughter is at the bedside.  Overall she feels her condition is stable, notes head feels better but is very hungry. Was transfused one unit overnight. Failed RN swallow evaluation x 2. Scheduled for MRI this morning.   OBJECTIVE Most recent Vital Signs: Filed Vitals:   10/14/12 0051 10/14/12 0200 10/14/12 0600 10/14/12 0754  BP:  117/52 131/80 126/74  Pulse:  113 119 116  Temp:  98.6 F (37 C) 97.3 F (36.3 C) 98.2 F (36.8 C)  TempSrc:  Oral Oral Oral  Resp:  18 18 18   Height: 5\' 1"  (1.549 m)     Weight: 189 lb 1.6 oz (85.775 kg)     SpO2:  97% 96% 92%   CBG (last 3)   Recent Labs  10/13/12 2033 10/14/12 0020 10/14/12 0730  GLUCAP 88 88 99    IV Fluid Intake:   . dextrose 5 % and 0.9% NaCl 50 mL/hr at 10/14/12 0023    MEDICATIONS  . antiseptic oral rinse  15 mL Mouth Rinse BID  . aspirin  81 mg Oral Daily  . buPROPion  100 mg Oral Daily  . carvedilol  3.125 mg Oral BID WC  . cefTRIAXone (ROCEPHIN)  IV  1 g Intravenous Q24H  . insulin aspart  0-9 Units Subcutaneous Q4H  . metFORMIN  500 mg Oral BID WC  . polyethylene glycol  17 g Oral Daily  . potassium chloride SA  10 mEq Oral Daily  . venlafaxine XR  75 mg Oral Q breakfast   PRN:  morphine injection,  ondansetron (ZOFRAN) IV, prochlorperazine, traMADol  Diet:  NPO  DVT Prophylaxis:  ICD  CLINICALLY SIGNIFICANT STUDIES Basic Metabolic Panel:  Recent Labs Lab 10/13/12 0900 10/13/12 0906  NA 141 142  K 4.2 4.2  CL 105 103  CO2 26  --   GLUCOSE 147* 144*  BUN 17 17  CREATININE 0.66 0.80  CALCIUM 8.7  --    Liver Function Tests:  Recent Labs Lab 10/13/12 0900  AST 13  ALT 9  ALKPHOS 55  BILITOT <0.1*  PROT 6.0  ALBUMIN 2.2*   CBC:  Recent Labs Lab 10/13/12 0900  10/13/12 1045 10/14/12 0455  WBC 8.7  --  9.2 6.0  NEUTROABS 6.3  --   --   --   HGB 6.8*  < > 6.8* 7.0*  HCT 24.0*  < > 23.5* 23.6*  MCV 72.7*  --  72.8* 74.0*  PLT 381  --  396 345  < > = values in this interval not displayed. Coagulation:  Recent Labs Lab 10/13/12 0900  LABPROT 12.3  INR 0.93   Cardiac Enzymes:  Recent Labs Lab 10/13/12 0900  TROPONINI <0.30  Urinalysis:  Recent Labs Lab 10/13/12 1102  COLORURINE YELLOW  LABSPEC 1.020  PHURINE 5.0  GLUCOSEU NEGATIVE  HGBUR NEGATIVE  BILIRUBINUR NEGATIVE  KETONESUR NEGATIVE  PROTEINUR NEGATIVE  UROBILINOGEN 0.2  NITRITE POSITIVE*  LEUKOCYTESUR NEGATIVE   Lipid Panel    Component Value Date/Time   CHOL 121 10/14/2012 0455   TRIG 116 10/14/2012 0455   HDL 39* 10/14/2012 0455   CHOLHDL 3.1 10/14/2012 0455   VLDL 23 10/14/2012 0455   LDLCALC 59 10/14/2012 0455   HgbA1C  No results found for this basename: HGBA1C    Urine Drug Screen:     Component Value Date/Time   LABOPIA NONE DETECTED 10/13/2012 1102   COCAINSCRNUR NONE DETECTED 10/13/2012 1102   LABBENZ NONE DETECTED 10/13/2012 1102   AMPHETMU NONE DETECTED 10/13/2012 1102   THCU NONE DETECTED 10/13/2012 1102   LABBARB NONE DETECTED 10/13/2012 1102    Alcohol Level:  Recent Labs Lab 10/13/12 0900  ETH <11    Dg Shoulder Right  10/13/2012   *RADIOLOGY REPORT*  Clinical Data: Right shoulder pain after fall.  RIGHT SHOULDER - 2+ VIEW  Comparison: None.  Findings: No  fracture or dislocation is noted.  Visualized ribs appear normal.  Mild degenerative changes seen involving the right acromioclavicular joint.  There is severe degenerative change involving the right glenohumeral joint with deformity of right humeral head as a result.  Severe narrowing of the right acromiohumeral space is noted consistent with rotator cuff injury.  IMPRESSION: Severe degenerative joint disease of right glenohumeral joint is noted.  No acute fracture or dislocation is noted.  Severe narrowing of right acromiohumeral space is noted consistent with rotator cuff injury.   Original Report Authenticated By: Lupita Raider.,  M.D.   Ct Head Wo Contrast  10/13/2012   CLINICAL DATA:  Unwitnessed fall. Left-sided weakness with slurred speech and left facial droop. Code stroke.  EXAM: CT HEAD WITHOUT CONTRAST  TECHNIQUE: Contiguous axial images were obtained from the base of the skull through the vertex without intravenous contrast.  COMPARISON:  None.  FINDINGS: The brainstem, cerebellum, cerebral peduncles, thalamus, basal ganglia, basilar cisterns, and ventricular system appear within normal limits. Periventricular white matter and corona radiata hypodensities favor chronic ischemic microvascular white matter disease. No intracranial hemorrhage, mass lesion, or acute CVA. Left parietal scalp hematoma observed. There is chronic sphenoid sinusitis.  IMPRESSION: 1. Chronic sphenoid sinusitis. No acute intracranial findings. 2. Periventricular white matter and corona radiata hypodensities favor chronic ischemic microvascular white matter disease. 3. Left parietal scalp hematoma.  These results were called by telephone at the time of interpretation on 10/13/2012 at 9:08 AM to Dr. Thad Ranger, who verbally acknowledged these results.   Electronically Signed   By: Herbie Baltimore   On: 10/13/2012 09:13   Ct Cervical Spine Wo Contrast  10/13/2012   *RADIOLOGY REPORT*  Clinical Data: Fall  CT CERVICAL SPINE  WITHOUT CONTRAST  Technique:  Multidetector CT imaging of the cervical spine was performed. Multiplanar CT image reconstructions were also generated.  Comparison: 05/01/2007  Findings:  Axial images shows no acute fracture or subluxation. There is diffuse osteopenia.  There is no pneumothorax in visualized lung apices.  Loculated small right pleural effusion noted posteriorly.  Atherosclerotic calcifications of the thoracic aorta.  Computer processed images shows no acute fracture or subluxation. Degenerative changes are noted C1-C2 articulation.  Mild disc space flattening at C3-C4 level.  Mild to moderate disc space flattening with anterior spurring and mild posterior spurring at C4-C5  level. Significant disc space flattening with anterior spurring and vacuum disc phenomenon at C5-C6 level.  Significant disc space flattening with anterior spurring and partial bony fusion at C6-C7 level. Partial bony fusion noted at C 7 T1 vertebral body.  Disc space flattening with anterior spurring noted at T1 T2 level.  Mild spinal canal stenosis due to posterior spurring at C5-C6 level.  No prevertebral soft tissue swelling.  Cervical airway is patent.  IMPRESSION: No acute fracture or subluxation.  Diffuse osteopenia.  Multilevel degenerative changes as described above.  Small loculated right pleural effusion.   Original Report Authenticated By: Natasha Mead, M.D.   Dg Pelvis Portable  10/13/2012   *RADIOLOGY REPORT*  Clinical Data: Larey Seat.  Right hip pain.  PORTABLE PELVIS  Comparison: 07/14/2012.  Findings: Both hips are normally located.  Advanced degenerative joint disease bilaterally.  No definite acute hip fracture.  The pubic symphysis demonstrates moderate degenerative changes.  No definite pubic rami fractures.  The SI joints are intact.  Mild degenerative changes.  No definite pelvic fractures.  Advanced degenerative changes noted in the lower lumbar spine could  IMPRESSION: Degenerative changes but no definite acute  hip or pelvic fracture.   Original Report Authenticated By: Rudie Meyer, M.D.   US Paracentesis  10/14/2012   *RADIOLOGY REPORT*  Clinical Data: Metastatic adenocarcinoma, peritoneal carcinomatosis, recurrent ascites, abdominal pain.  Request is made for therapeutic paracentesis.  ULTRASOUND GUIDED THERAPEUTIC  PARACENTESIS  An ultrasound guided paracentesis was thoroughly discussed with the patient and questions answered.  The benefits, risks, alternatives and complications were also discussed.  The patient understands and wishes to proceed with the procedure.  Written consent was obtained.  Ultrasound was performed to localize and mark an adequate pocket of fluid in the right lower quadrant of the abdomen.  The area was then prepped and draped in the normal sterile fashion.  1% Lidocaine was used for local anesthesia.  Under ultrasound guidance a 19 gauge Yueh catheter was introduced.  Paracentesis was performed.  The catheter was removed and a dressing applied.  Complications:  none  Findings:  A total of approximately 3.9 liters of bloody fluid was removed.  IMPRESSION: Successful ultrasound guided therapeutic paracentesis yielding 3.9 liters of ascites.  Read by: Jeananne Rama, P.A.-C   Original Report Authenticated By: Judie Petit. Miles Costain, M.D.   Dg Chest Portable 1 View  10/13/2012   *RADIOLOGY REPORT*  Clinical Data: Fall, stroke  PORTABLE CHEST - 1 VIEW  Comparison: 07/14/2012  Findings: Cardiomediastinal silhouette is stable.  Mild emphysematous changes.  No pulmonary edema.  There is hazy right base medially atelectasis or infiltrate.  Extensive degenerative changes bilateral shoulders.  IMPRESSION:  Mild emphysematous changes.  No pulmonary edema.  There is hazy right base medially atelectasis or infiltrate.  Extensive degenerative changes bilateral shoulders.   Original Report Authenticated By: Natasha Mead, M.D.   Physical Exam   Mental Status:  Alert, oriented, pleasant, appropriate speech. Speech  fluent, mild dysarthria. Able to follow 3 step commands without difficulty.  Cranial Nerves:  PERRL, blinks to threat bilat, EOMI, decreased LT on L face, L facial droop, decreased hearing bilat(told is chronic), tongue midline Motor:  Right : Upper extremity 5/5 Left: Upper extremity 5-/5  Lower extremity 5/5 Lower extremity 4+/5; external rotation noted  Tone and bulk:normal tone throughout; no atrophy noted  Sensory: Pinprick and light touch intact decreased on the left  Deep Tendon Reflexes: 2+ and symmetric with absent AJ's bilaterally  Plantars:  Right: downgoing  Left: upgoing  Cerebellar:  normal finger-to-nose  Gait: not tested  ASSESSMENT [name] is a 79 y.o. female presenting with weakness and left facial droop. TPA not given as outside the window.CT head shows no signs of acute process. Suspect acute ischemic process though with CA history would consider metastatic process. Work up underway. Noted to have marked anemia, s/p transfusion one unit.  Hospital day # 1  TREATMENT/PLAN  1)MRI/A brain  2)PT, OT and speech consult, NPO currently 2/2 failed bedside swallow eval 3)echocardiogram 4)Carotid dopplers 5) continue ASA 81mg  daily 6) No statin as LDL <100 7) Telemetry monitoring  8)Anemia workup per primary team

## 2012-10-14 NOTE — Evaluation (Signed)
Physical Therapy Evaluation Patient Details Name: [name] MRN: [id number] DOB: 1922/08/31 Today's Date: 10/14/2012 Time: 8413-2440 PT Time Calculation (min): 34 min  PT Assessment / Plan / Recommendation History of Present Illness  [name] an 79 y.o. female who awakened early morning of 8/29. Was normal at that time. Later this morning when she got up to use the bathroom she had difficulty getting her pants down. She began to feel poorly and then fell. When her aide found her she was on the floor she had a left facial droop and was weak on the left side. EMS was called and the patient was brought in as a code stroke. Initial NIHSS of 8.  await mri  Clinical Impression  Pt very pleasant and eager to eat. Pt lives with spouse who has 24hr aide and pt typically ambulates with RW. Pt with decreased proprioception and sensation LUE as well as decreased balance and proprioception of trunk with mobility. Pt will benefit from acute therapy to maximize mobility, balance, safety and function prior to discharge. Pt's dgtr is a Hospital doctor and son-in-law is a OPPT and family educated for extremity exercises for strengthening as well as pt awareness of spatial perception. Pt and family eager for pt to progress.     PT Assessment  Patient needs continued PT services    Follow Up Recommendations  CIR;Supervision/Assistance - 24 hour    Does the patient have the potential to tolerate intense rehabilitation      Barriers to Discharge Decreased caregiver support      Equipment Recommendations       Recommendations for Other Services Rehab consult   Frequency Min 4X/week    Precautions / Restrictions Precautions Precautions: Fall Precaution Comments: decreased proprioception and sensation LUE   Pertinent Vitals/Pain No pain VSS      Mobility  Bed Mobility Bed Mobility: Supine to Sit;Sitting - Scoot to Edge of Bed Supine to Sit: 3: Mod assist;HOB flat;With rails Sitting - Scoot to Edge  of Bed: 4: Min guard Details for Bed Mobility Assistance: increased assist to elevate trunk from surface and cueing to scoot Transfers Transfers: Sit to Stand;Stand to Sit Sit to Stand: From chair/3-in-1;From bed;4: Min assist Stand to Sit: To chair/3-in-1;4: Min assist Details for Transfer Assistance: cueing for hand placement, body position and safety with transfers Ambulation/Gait Ambulation/Gait Assistance: 1: +2 Total assist Ambulation/Gait: Patient Percentage: 50% Ambulation Distance (Feet): 20 Feet (x 2 trials ) Assistive device: Rolling walker;2 person hand held assist Ambulation/Gait Assistance Details: initially attempted RW, pt able to place left hand on RW but unable to maintain grip and unaware of loss of hand placement. Attempted bil hand held assist in front of pt by one therapist and pt with decreased initiation and increased left lean. Pt performed best with 2 person HHA with cueing for posture and position in space. Pt with tendency toward flexion and left lean without constant cueing. Gait Pattern: Shuffle;Lateral trunk lean to left;Trunk flexed Gait velocity: decreased Stairs: No Modified Rankin (Stroke Patients Only) Pre-Morbid Rankin Score: Moderately severe disability Modified Rankin: Moderately severe disability    Exercises     PT Diagnosis: Difficulty walking;Generalized weakness  PT Problem List: Decreased strength;Decreased cognition;Decreased activity tolerance;Decreased balance;Decreased mobility;Decreased coordination;Impaired sensation PT Treatment Interventions: Gait training;Functional mobility training;Therapeutic activities;Therapeutic exercise;Patient/family education;Neuromuscular re-education;Balance training;DME instruction     PT Goals(Current goals can be found in the care plan section) Acute Rehab PT Goals Patient Stated Goal: return home with spouse PT Goal Formulation: With  patient/family Time For Goal Achievement: 10/28/12 Potential to  Achieve Goals: Good  Visit Information  Last PT Received On: 10/14/12 Assistance Needed: +2 (safety with gait) History of Present Illness: [name] an 79 y.o. female who awakened early morning of 8/29. Was normal at that time. Later this morning when she got up to use the bathroom she had difficulty getting her pants down. She began to feel poorly and then fell. When her aide found her she was on the floor she had a left facial droop and was weak on the left side. EMS was called and the patient was brought in as a code stroke. Initial NIHSS of 8.  await mri       Prior Functioning  Home Living Family/patient expects to be discharged to:: Private residence Living Arrangements: Spouse/significant other Available Help at Discharge: Personal care attendant;Available 24 hours/day (spouse's aide 24. pt aide for bathing and dressing) Type of Home: House Home Access: Ramped entrance Home Layout: One level Home Equipment: Walker - 2 wheels;Shower seat;Walker - 4 wheels;Bedside commode Prior Function Level of Independence: Needs assistance Gait / Transfers Assistance Needed: ambulates with Rollator ADL's / Homemaking Assistance Needed: assist for bathing 2x/wk and with items dressing like her bra Communication Communication: HOH Dominant Hand: Right    Cognition  Cognition Arousal/Alertness: Awake/alert Behavior During Therapy: WFL for tasks assessed/performed Overall Cognitive Status: Impaired/Different from baseline Area of Impairment: Safety/judgement Safety/Judgement: Decreased awareness of safety;Decreased awareness of deficits    Extremity/Trunk Assessment Upper Extremity Assessment Upper Extremity Assessment: Defer to OT evaluation Lower Extremity Assessment Lower Extremity Assessment: Generalized weakness (both grossly 3/5 hip flexion, knee flexion and extension) Cervical / Trunk Assessment Cervical / Trunk Assessment: Kyphotic   Balance Balance Balance Assessed: Yes Static  Sitting Balance Static Sitting - Balance Support: Bilateral upper extremity supported;Feet supported Static Sitting - Level of Assistance: 4: Min assist Static Sitting - Comment/# of Minutes: 4, cueing for midline posture due to left lean Static Standing Balance Static Standing - Balance Support: Bilateral upper extremity supported Static Standing - Level of Assistance: 4: Min assist Static Standing - Comment/# of Minutes: 2  End of Session PT - End of Session Equipment Utilized During Treatment: Gait belt Activity Tolerance: Patient tolerated treatment well Patient left: in chair;with call bell/phone within reach;with family/visitor present Nurse Communication: Mobility status  GP     Delorse Lek 10/14/2012, 10:29 AM Toney Sang Sangeeta Youse, PT [phone]

## 2012-10-14 NOTE — Progress Notes (Signed)
VASCULAR LAB PRELIMINARY  PRELIMINARY  PRELIMINARY  PRELIMINARY  Carotid Dopplers completed.    Preliminary report:  1-39% ICA stenosis. Vertebral artery flow is antegrade.  Matthias Bogus, RVT 10/14/2012, 3:50 PM

## 2012-10-14 NOTE — Evaluation (Signed)
Clinical/Bedside Swallow Evaluation Patient Details  Name: [name] MRN: [id number] Date of Birth: 1922-08-24  Today's Date: 10/14/2012 Time: 1130-1215 SLP Time Calculation (min): 45 min  Past Medical History:  Past Medical History  Diagnosis Date  . Cataract   . Diabetes mellitus   . Cancer    Past Surgical History:  Past Surgical History  Procedure Laterality Date  . Bladder surgery  06/1989  . Knee surgery  10/30/1993    Lt   . Back surgery  20/11/1998    ruptured disc  . Colon surgery    . Hernia repair     HPI:  [name] an 79 y.o. female who awakened early morning of 8/29. Was normal at that time. Later this morning when she got up to use the bathroom she had difficulty getting her pants down. She began to feel poorly and then fell. When her aide found her she was on the floor she had a left facial droop and was weak on the left side. EMS was called and the patient was brought in as a code stroke. Initial NIHSS of 8. BSE indicated per stroke protocol.   Patient and family members present report difficulty swallowing pills and increased coughing with liquids prior to current episode.     Assessment / Plan / Recommendation Clinical Impression  Minimal sensory motor oral dysphagia, moderate sensory pharyngeal dysphagia with suspected baseline esophageal dysphagia.   Noted soft signs of penetration vs. aspiration noted s/p swallow of all consistencies administered.  Increase WOB, intermittent wet vocal quality, increased throat clears and coughing  observed after swallow.  Delay in initiation with reduced hyoid laryngeal elevation as noted per palpation.  Recommend continued NPO status and proceed with objective evaluation of MBS to assess risk for aspiration and recommend safest, PO diet secondary to s/s present,  new onset of weakness, and reports of difficulty swallowing prior to current episode.  MBS to be completed this date.     Aspiration Risk  Moderate    Diet  Recommendation NPO;Ice chips PRN after oral care        Other  Recommendations Oral Care Recommendations: Oral care QID      Frequency and Duration min 2x/week  2 weeks       SLP Swallow Goals  Pending results of MBS    Swallow Study Prior Functional Status  Type of Home: House Available Help at Discharge: Personal care attendant;Available 24 hours/day (spouse's aide 24. pt aide for bathing and dressing)    General Date of Onset: 10/13/12 HPI: [name] an 79 y.o. female who awakened early morning of 8/29. Was normal at that time. Later this morning when she got up to use the bathroom she had difficulty getting her pants down. She began to feel poorly and then fell. When her aide found her she was on the floor she had a left facial droop and was weak on the left side. EMS was called and the patient was brought in as a code stroke. Initial NIHSS of 8.   Type of Study: Bedside swallow evaluation Diet Prior to this Study: NPO Temperature Spikes Noted: No Respiratory Status: Supplemental O2 delivered via (comment) History of Recent Intubation: No Behavior/Cognition: Alert;Cooperative;Pleasant mood Oral Cavity - Dentition: Adequate natural dentition Self-Feeding Abilities: Needs assist;Able to feed self Patient Positioning: Upright in bed Baseline Vocal Quality: Breathy Volitional Cough: Strong Volitional Swallow: Able to elicit    Oral/Motor/Sensory Function Overall Oral Motor/Sensory Function: Impaired Labial ROM: Reduced left Labial  Symmetry: Abnormal symmetry left Labial Strength: Reduced Labial Sensation: Reduced Lingual ROM: Reduced left Lingual Symmetry: Abnormal symmetry left Lingual Strength: Reduced Lingual Sensation: Reduced Facial ROM: Reduced left Facial Symmetry: Left droop Facial Strength: Reduced Facial Sensation: Reduced Velum: Within Functional Limits Mandible: Within Functional Limits   Ice Chips Ice chips: Impaired Pharyngeal Phase Impairments:  Suspected delayed Swallow;Decreased hyoid-laryngeal movement;Throat Clearing - Delayed   Thin Liquid Thin Liquid: Impaired Presentation: Cup;Spoon Pharyngeal  Phase Impairments: Suspected delayed Swallow;Decreased hyoid-laryngeal movement;Throat Clearing - Delayed;Cough - Delayed    Nectar Thick Nectar Thick Liquid: Not tested   Honey Thick Honey Thick Liquid: Not tested   Puree Puree: Impaired Presentation: Self Fed;Spoon Oral Phase Impairments: Reduced labial seal Pharyngeal Phase Impairments: Suspected delayed Swallow;Decreased hyoid-laryngeal movement;Throat Clearing - Immediate   Solid   GO    Solid: Impaired Oral Phase Functional Implications: Left anterior spillage;Oral residue Pharyngeal Phase Impairments: Suspected delayed Swallow;Decreased hyoid-laryngeal movement;Throat Clearing - Immediate;Cough - Delayed      Breanna Fowler Breanna, CCC-SLP [phone] [facility] 10/14/2012,12:56 PM

## 2012-10-14 NOTE — Progress Notes (Signed)
Patient admitted status post fall with noted weakness and left facial droop and is being worked up for possible CVA.  DNR on chart.  Found patient in MRI for further evaluation, CT head revealed no acute findings.  Patient received 1 unit transfusion for anemia.  Will await results of neurological evaluation and continue supportive care.  Encourage a call to Hospice at [phone] with any needs.  April Costella Hatcher, RN, BSN Hospice

## 2012-10-14 NOTE — Clinical Social Work Psychosocial (Signed)
Clinical Social Work Department BRIEF PSYCHOSOCIAL ASSESSMENT 10/14/2012  Patient:  [name], [name]     Account Number:  [id number]     Admit date:  10/13/2012  Clinical Social Worker:  Delmer Islam  Date/Time:  10/14/2012 04:52 AM  Referred by:  RN  Date Referred:  10/14/2012 Referred for  SNF Placement   Other Referral:   Interview type:  Family Other interview type:   CSW was initially contacted by patient's nurse as family had requested to talk with a SW.    PSYCHOSOCIAL DATA Living Status:  HUSBAND Admitted from facility:   Level of care:   Primary support name:  Heide Guile Primary support relationship to patient:  CHILD, ADULT Degree of support available:   Ms. Orland Penman is involved and concerned about patient's care. Her phone number is [phone].    CURRENT CONCERNS Current Concerns  Post-Acute Placement   Other Concerns:    SOCIAL WORK ASSESSMENT / PLAN CSW talked by phone with Ms. Chasse per request from nurse. Daughter advised CSW that mother receives hospice services (Hospice is secondary payer for patient) and was advised by hospice SW that if patient goes to inpatient rehab, they will revoke services and reinstate once rehab completed.     CSW asked daughter if they would consider SNF placement for [facility] rehab if [facility] does not accept patient and daughter indicated that mother mentioned [facility] as a possibility. CSW explained SNF search process and that this is a back-up in case [facility] declines. Daughter also indicated that her father is ill and gets care at home, but her mother does not receive 24/7 care at home.   Assessment/plan status:  Psychosocial Support/Ongoing Assessment of Needs Other assessment/ plan:   Information/referral to community resources:    PATIENT'S/FAMILY'S RESPONSE TO PLAN OF CARE: Daughter agreeable to SNF search as back-up if [facility] declines. Ms. Orland Penman was appreciative of CSW's call and assistance.

## 2012-10-14 NOTE — Progress Notes (Signed)
[facility] PROGRESS NOTE  [name] GLO:[id number] DOB: 03/18/1922 DOA: 10/13/2012 PCP: Pearla Dubonnet, MD  Assessment/Plan: Active Problems:   CVA (cerebral infarction)    1. Lt sided weakness/Dysarthria: Patient presented following a fall, and was found to have left facial droop and left-sided weakness. This clinical picture is consistent with acute right MCA territory CVA, although metastatic disease is a possibility. Head CT scan was negative for acute findings. Dr Thana Farr provided neurology consultation, and CVA work up was commenced. Now on low dose ASA. Brain MRI revealed scattered small acute posterior right MCA territory  Infarcts without mass effect, punctate petechial hemorrhage in the right parietal lobe, as well as several tiny acute infarcts also in the posterior left MCA  territory and also the right cerebellum. SLP has evaluated, and MBS recommended. 2D Echo and vascular doppler are pending. Will await further recommendations from neurology.  2. Metastatic adenocarcinoma/Recurrent Ascites: Patient has known advanced malignancy, of probable appendiceal vs colonic origin, complicated by carcinomatosis and recurrent malignant ascites. Originally diagnosed in 07/2010. Patient has been treated with chemotherapy, but this was subsequently discontinued. Now on Hospice, and undergoes weekly therapeutic paracentesis. Paracentesis. Patient is s/p U/S-Guided paracentesis on 10/13/12, yielding a total of approximately 3.9 liters of bloody fluid. Perhaps, patient will benefit from a PleurX catheter. Will discuss with oncology.  3. Anemia/Positive FOBT: HB at presentation, was found to be low, at 6.8, MCV 72.8. FOBT was positive. Etiology is likely from chronic disease, malignancy, occult GI blood loss and bloody ascites. No obvious rectal bleeding noticed by patient, except hemorrhoids an no features of acute bleeding. Transfusing prn. Will likely need a total of 3 units  PRBC.  4. Fall/Scalp Hematoma: Circumstances are not clear, but is likely to be multi-factorial, secondary to probable CVA, and generalized weakness/Anemia. Fortunately, imaging studies show no evidence of acute bony injuries, although head CT scan revealed a left parietal scalp hematoma. 5. HTN: BP is reasonable at this time, in context of possible acute CVA.  6. UTI:  Patient has a positive urinary sediment, with bacteremia. Now on iv Rocephin, day #2. Urine culture is pending.  7. DM: CBGs appear controlled at this time. Will hold Metformin, and manage with SSI.    Code Status: DNR.  Family Communication:  Disposition Plan: To be determined.    Brief narrative: 79 y.o. 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 Later she got up to use the bathroom she had some difficulty, then fell. When her aide found her, she was on the floor, had a left facial droop and was weak on the left side. EMS was called and the patient was brought in as a code stroke. Initial NIHSS of 8. Patient also found to have anemia Hg-6.8 with + occult blood. No TPA due to metastatic CA and anemia/GIB. Admitted for further evaluation and management.    Consultants:  Dr Thana Farr, neurologist.  Procedures:  Head CT scan.  CXR.  Pelvic X-Ray.  CT Cervical spine.  X-Ray right shoulder.   U/S-Guided paracentesis.   Antibiotics:  Rocephin 10/13/12>>>  HPI/Subjective: No new issues.   Objective: Vital signs in last 24 hours: Temp:  [97.3 F (36.3 C)-98.6 F (37 C)] 98.2 F (  36.8 C) (08/30 0754) Pulse Rate:   [99-119] 116 (08/30 0754) Resp:  [18-26] 18 (08/30 0754) BP: (100-144)/(41-87) 126/74 mmHg (08/30 0754) SpO2:  [91 %-100 %] 92 % (08/30 0754) Weight:  [85.775 kg (189 lb 1.6 oz)] 85.775 kg (189 lb 1.6 oz) (08/30 0051) Weight change:     Intake/Output from previous day: 08/29 0701 - 08/30 0700 In: 248.3 [Blood:248.3] Out: -  Total I/O In: -  Out: 100 [Urine:100]   Physical Exam: General: Comfortable, alert, communicative, fully oriented, not short of breath at rest.  HEENT:  Moderate clinical pallor, no jaundice, no conjunctival injection or discharge. NECK:  Supple, JVP not seen, no carotid bruits, no palpable lymphadenopathy, no palpable goiter. CHEST:  Clinically clear to auscultation, no wheezes, no crackles. HEART:  Sounds 1 and 2 heard, normal, regular, no murmurs. ABDOMEN:  Moderately obese, soft, non-tender, no palpable organomegaly, no palpable masses, normal bowel sounds. GENITALIA:  Not examined. LOWER EXTREMITIES:  No pitting edema, palpable peripheral pulses. MUSCULOSKELETAL SYSTEM:  Generalized osteoarthritic changes, otherwise, normal. CENTRAL NERVOUS SYSTEM:  Patient has mild left facial asymmetry and mild LUE weakness. focal neurologic deficit on gross examination.  Lab Results:  Recent Labs  10/13/12 1045 10/14/12 0455  WBC 9.2 6.0  HGB 6.8* 7.0*  HCT 23.5* 23.6*  PLT 396 345    Recent Labs  10/13/12 0900 10/13/12 0906  NA 141 142  K 4.2 4.2  CL 105 103  CO2 26  --   GLUCOSE 147* 144*  BUN 17 17  CREATININE 0.66 0.80  CALCIUM 8.7  --    No results found for this or any previous visit (from the past 240 hour(s)).   Studies/Results: Dg Shoulder Right  10/13/2012   *RADIOLOGY REPORT*  Clinical Data: Right shoulder pain after fall.  RIGHT SHOULDER - 2+ VIEW  Comparison: None.  Findings: No fracture or dislocation is noted.  Visualized ribs appear normal.  Mild degenerative changes seen involving the right acromioclavicular joint.  There is severe  degenerative change involving the right glenohumeral joint with deformity of right humeral head as a result.  Severe narrowing of the right acromiohumeral space is noted consistent with rotator cuff injury.  IMPRESSION: Severe degenerative joint disease of right glenohumeral joint is noted.  No acute fracture or dislocation is noted.  Severe narrowing of right acromiohumeral space is noted consistent with rotator cuff injury.   Original Report Authenticated By: Lupita Raider.,  M.D.   Ct Head Wo Contrast  10/13/2012   CLINICAL DATA:  Unwitnessed fall. Left-sided weakness with slurred speech and left facial droop. Code stroke.  EXAM: CT HEAD WITHOUT CONTRAST  TECHNIQUE: Contiguous axial images were obtained from the base of the skull through the vertex without intravenous contrast.  COMPARISON:  None.  FINDINGS: The brainstem, cerebellum, cerebral peduncles, thalamus, basal ganglia, basilar cisterns, and ventricular system appear within normal limits. Periventricular white matter and corona radiata hypodensities favor chronic ischemic microvascular white matter disease. No intracranial hemorrhage, mass lesion, or acute CVA. Left parietal scalp hematoma observed. There is chronic sphenoid sinusitis.  IMPRESSION: 1. Chronic sphenoid sinusitis. No acute intracranial findings. 2. Periventricular white matter and corona radiata hypodensities favor chronic ischemic microvascular white matter disease. 3. Left parietal scalp hematoma.  These results were called by telephone at the time of interpretation on 10/13/2012 at 9:08 AM to Dr. Thad Ranger, who verbally acknowledged these results.   Electronically Signed   By: Herbie Baltimore   On: 10/13/2012 09:13   Ct  Cervical Spine Wo Contrast  10/13/2012   *RADIOLOGY REPORT*  Clinical Data: Fall  CT CERVICAL SPINE WITHOUT CONTRAST  Technique:  Multidetector CT imaging of the cervical spine was performed. Multiplanar CT image reconstructions were also generated.  Comparison:  05/01/2007  Findings:  Axial images shows no acute fracture or subluxation. There is diffuse osteopenia.  There is no pneumothorax in visualized lung apices.  Loculated small right pleural effusion noted posteriorly.  Atherosclerotic calcifications of the thoracic aorta.  Computer processed images shows no acute fracture or subluxation. Degenerative changes are noted C1-C2 articulation.  Mild disc space flattening at C3-C4 level.  Mild to moderate disc space flattening with anterior spurring and mild posterior spurring at C4-C5 level. Significant disc space flattening with anterior spurring and vacuum disc phenomenon at C5-C6 level.  Significant disc space flattening with anterior spurring and partial bony fusion at C6-C7 level. Partial bony fusion noted at C 7 T1 vertebral body.  Disc space flattening with anterior spurring noted at T1 T2 level.  Mild spinal canal stenosis due to posterior spurring at C5-C6 level.  No prevertebral soft tissue swelling.  Cervical airway is patent.  IMPRESSION: No acute fracture or subluxation.  Diffuse osteopenia.  Multilevel degenerative changes as described above.  Small loculated right pleural effusion.   Original Report Authenticated By: Natasha Mead, M.D.   Dg Pelvis Portable  10/13/2012   *RADIOLOGY REPORT*  Clinical Data: Larey Seat.  Right hip pain.  PORTABLE PELVIS  Comparison: 07/14/2012.  Findings: Both hips are normally located.  Advanced degenerative joint disease bilaterally.  No definite acute hip fracture.  The pubic symphysis demonstrates moderate degenerative changes.  No definite pubic rami fractures.  The SI joints are intact.  Mild degenerative changes.  No definite pelvic fractures.  Advanced degenerative changes noted in the lower lumbar spine could  IMPRESSION: Degenerative changes but no definite acute hip or pelvic fracture.   Original Report Authenticated By: Rudie Meyer, M.D.   US Paracentesis  10/14/2012   *RADIOLOGY REPORT*  Clinical Data: Metastatic  adenocarcinoma, peritoneal carcinomatosis, recurrent ascites, abdominal pain.  Request is made for therapeutic paracentesis.  ULTRASOUND GUIDED THERAPEUTIC  PARACENTESIS  An ultrasound guided paracentesis was thoroughly discussed with the patient and questions answered.  The benefits, risks, alternatives and complications were also discussed.  The patient understands and wishes to proceed with the procedure.  Written consent was obtained.  Ultrasound was performed to localize and mark an adequate pocket of fluid in the right lower quadrant of the abdomen.  The area was then prepped and draped in the normal sterile fashion.  1% Lidocaine was used for local anesthesia.  Under ultrasound guidance a 19 gauge Yueh catheter was introduced.  Paracentesis was performed.  The catheter was removed and a dressing applied.  Complications:  none  Findings:  A total of approximately 3.9 liters of bloody fluid was removed.  IMPRESSION: Successful ultrasound guided therapeutic paracentesis yielding 3.9 liters of ascites.  Read by: Jeananne Rama, P.A.-C   Original Report Authenticated By: Judie Petit. Miles Costain, M.D.   Dg Chest Portable 1 View  10/13/2012   *RADIOLOGY REPORT*  Clinical Data: Fall, stroke  PORTABLE CHEST - 1 VIEW  Comparison: 07/14/2012  Findings: Cardiomediastinal silhouette is stable.  Mild emphysematous changes.  No pulmonary edema.  There is hazy right base medially atelectasis or infiltrate.  Extensive degenerative changes bilateral shoulders.  IMPRESSION:  Mild emphysematous changes.  No pulmonary edema.  There is hazy right base medially atelectasis or infiltrate.  Extensive  degenerative changes bilateral shoulders.   Original Report Authenticated By: Natasha Mead, M.D.    Medications: Scheduled Meds: . antiseptic oral rinse  15 mL Mouth Rinse BID  . aspirin  81 mg Oral Daily  . buPROPion  100 mg Oral Daily  . carvedilol  3.125 mg Oral BID WC  . cefTRIAXone (ROCEPHIN)  IV  1 g Intravenous Q24H  . insulin aspart   0-9 Units Subcutaneous Q4H  . metFORMIN  500 mg Oral BID WC  . polyethylene glycol  17 g Oral Daily  . potassium chloride SA  10 mEq Oral Daily  . venlafaxine XR  75 mg Oral Q breakfast   Continuous Infusions: . dextrose 5 % and 0.9% NaCl 50 mL/hr at 10/14/12 0023   PRN Meds:.morphine injection, ondansetron (ZOFRAN) IV, prochlorperazine, traMADol    LOS: 1 day   Amare Bail,CHRISTOPHER  [facility] Pager [phone]. If 8PM-8AM, please contact night-coverage at [email or web address], password [facility] 10/14/2012, 8:56 AM  LOS: 1 day

## 2012-10-15 DIAGNOSIS — I6350 Cerebral infarction due to unspecified occlusion or stenosis of unspecified cerebral artery: Secondary | ICD-10-CM

## 2012-10-15 LAB — GLUCOSE, CAPILLARY: Glucose-Capillary: 103 mg/dL — ABNORMAL HIGH (ref 70–99)

## 2012-10-15 LAB — CBC
HCT: 27.8 % — ABNORMAL LOW (ref 36.0–46.0)
Hemoglobin: 8.4 g/dL — ABNORMAL LOW (ref 12.0–15.0)
MCH: 22.6 pg — ABNORMAL LOW (ref 26.0–34.0)
MCV: 74.9 fL — ABNORMAL LOW (ref 78.0–100.0)
RBC: 3.71 MIL/uL — ABNORMAL LOW (ref 3.87–5.11)
WBC: 7.2 10*3/uL (ref 4.0–10.5)

## 2012-10-15 LAB — URINE CULTURE: Colony Count: >=100000

## 2012-10-15 LAB — BASIC METABOLIC PANEL
BUN: 10 mg/dL (ref 6–23)
CO2: 27 mEq/L (ref 19–32)
Calcium: 8.2 mg/dL — ABNORMAL LOW (ref 8.4–10.5)
Chloride: 106 mEq/L (ref 96–112)
Creatinine, Ser: 0.67 mg/dL (ref 0.50–1.10)
Glucose, Bld: 136 mg/dL — ABNORMAL HIGH (ref 70–99)

## 2012-10-15 MED ORDER — FUROSEMIDE 10 MG/ML IJ SOLN
20.0000 mg | Freq: Once | INTRAMUSCULAR | Status: AC
  Administered 2012-10-15: 20 mg via INTRAVENOUS

## 2012-10-15 MED ORDER — POTASSIUM CHLORIDE CRYS ER 20 MEQ PO TBCR
40.0000 meq | EXTENDED_RELEASE_TABLET | Freq: Once | ORAL | Status: AC
  Administered 2012-10-15: 40 meq via ORAL
  Filled 2012-10-15: qty 2

## 2012-10-15 MED ORDER — FUROSEMIDE 10 MG/ML IJ SOLN
INTRAMUSCULAR | Status: AC
  Administered 2012-10-15: 20 mg via INTRAVENOUS
  Filled 2012-10-15: qty 4

## 2012-10-15 NOTE — Progress Notes (Addendum)
[facility] PROGRESS NOTE  [name] WUJ:[id number] DOB: [birth year] DOA: 10/13/2012 PCP: Pearla Dubonnet, MD  Assessment/Plan: Active Problems:   CVA (cerebral infarction)    1. Lt sided weakness/Dysarthria: Patient presented following a fall, and was found to have left facial droop and left-sided weakness. This clinical picture is consistent with acute right MCA territory CVA, although metastatic disease was considered a possibility. Head CT scan was negative for acute findings. Dr Thana Farr provided neurology consultation, and CVA work up was commenced. Brain MRI revealed scattered small acute posterior right MCA territory infarcts without mass effect, punctate petechial hemorrhage in the right parietal lobe, as well as several tiny acute infarcts also in the posterior left MCA territory and also the right cerebellum. SLP has evaluated, MBS done, and patient is now on D2/Thin. Vascular doppler revealed 1-39% ICA stenosis. Vertebral artery flow is antegrade. Managing with low dose ASA. 2D Echocardiogram is pending. For PF/OT.  2. Metastatic adenocarcinoma/Recurrent Ascites: Patient has known advanced malignancy, of probable appendiceal vs colonic origin, complicated by carcinomatosis and recurrent malignant ascites. Originally diagnosed in 07/2010. Patient has been treated with chemotherapy, but this was subsequently discontinued. Now on Hospice, and undergoes weekly therapeutic paracentesis. Patient is s/p U/S-Guided paracentesis on 10/13/12, yielding a total of approximately 3.9 liters of bloody fluid. Perhaps, patient will benefit from a PleurX catheter. Will discuss with primary oncologist, after holiday weekend.  3. Anemia/Positive FOBT: HB at presentation, was found to be low, at 6.8, MCV 72.8. FOBT was positive. Etiology is likely from chronic disease, malignancy, occult GI blood loss and bloody ascites. No obvious rectal bleeding noticed by patient, except hemorrhoids an no  features of acute bleeding. Transfusing prn. Will need a total of 3 units PRBC. Following CBC. 4. Fall/Scalp Hematoma: Circumstances are not clear, but is likely to be multi-factorial, secondary to probable CVA, and generalized weakness/Anemia. Fortunately, imaging studies show no evidence of acute bony injuries, although head CT scan revealed a left parietal scalp hematoma. 5. HTN: BP is reasonable at this time, in context of possible acute CVA.  6. UTI:  Patient had a positive urinary sediment, with bacteremia. Now on iv Rocephin, day #3. As urine culture revealed multiple bacterial morphotypes, consistent with possible contamination, have discontinued Rocephin, after today's dose.  7. DM: CBGs appear controlled at this time. Metformin is on hold. HBA1C is 6.2.  8. Query OSA:  Per patient's daughter, patient has been on nocturnal CPAP, for about 10 years. We shall continue.    Code Status: DNR.  Family Communication:  Disposition Plan: To be determined.    Brief narrative: 79 y.o. 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 Later she got up to use the bathroom she had some difficulty, then fell. When her aide found her, she was on the floor, had a left facial droop and was weak on the left side. EMS was called and the patient was brought in as a code stroke. Initial NIHSS of 8. Patient also found to have anemia Hg-6.8 with + occult blood. No TPA due to metastatic CA and anemia/GIB. Admitted for further evaluation and management.    Consultants:  Dr Thana Farr, neurologist.  Procedures:  Head CT  scan.  CXR.  Pelvic X-Ray.  CT Cervical spine.  X-Ray right shoulder.   U/S-Guided paracentesis.   Antibiotics:  Rocephin 10/13/12-10/15/12.   HPI/Subjective: No new issues.   Objective: Vital signs in last 24 hours: Temp:  [97.4 F (36.3 C)-98.4 F (36.9 C)] 97.5 F (36.4 C) (08/31 1150) Pulse Rate:  [88-127] 88 (08/31 1150) Resp:  [16-21] 20 (08/31 1150) BP: (112-140)/(48-82) 115/48 mmHg (08/31 1150) SpO2:  [95 %-100 %] 98 % (08/31 1150) Weight change:     Intake/Output from previous day: 08/30 0701 - 08/31 0700 In: 2070.8 [P.O.:240; I.V.:1480.8; Blood:350] Out: 100 [Urine:100]     Physical Exam: General: Comfortable, alert, communicative, fully oriented, not short of breath at rest.  HEENT:  Moderate clinical pallor, no jaundice, no conjunctival injection or discharge. NECK:  Supple, JVP not seen, no carotid bruits, no palpable lymphadenopathy, no palpable goiter. CHEST:  Clinically clear to auscultation, no wheezes, no crackles. HEART:  Sounds 1 and 2 heard, normal, regular, no murmurs. ABDOMEN:  Moderately obese, soft, non-tender, no palpable organomegaly, no palpable masses, normal bowel sounds. GENITALIA:  Not examined. LOWER EXTREMITIES:  No pitting edema, palpable peripheral pulses. MUSCULOSKELETAL SYSTEM:  Generalized osteoarthritic changes, otherwise, normal. CENTRAL NERVOUS SYSTEM:  Patient has mild left facial asymmetry and mild LUE weakness. focal neurologic deficit on gross examination.  Lab Results:  Recent Labs  10/14/12 0455 10/15/12 0410  WBC 6.0 7.2  HGB 7.0* 8.4*  HCT 23.6* 27.8*  PLT 345 328    Recent Labs  10/13/12 0900 10/13/12 0906 10/15/12 0410  NA 141 142 141  K 4.2 4.2 3.4*  CL 105 103 106  CO2 26  --  27  GLUCOSE 147* 144* 136*  BUN 17 17 10   CREATININE 0.66 0.80 0.67  CALCIUM 8.7  --  8.2*   Recent Results (from the past 240 hour(s))  URINE CULTURE     Status: None   Collection Time    10/13/12 11:02 AM       Result Value Range Status   Specimen Description URINE, RANDOM   Final   Special Requests NONE   Final   Culture  Setup Time     Final   Value: 10/13/2012 12:21     Performed at [facility] Count     Final   Value: >=100,000 COLONIES/ML     Performed at [facility]   Culture     Final   Value: Multiple bacterial morphotypes present, none predominant. Suggest appropriate recollection if clinically indicated.     Performed at [facility]   Report Status 10/15/2012 FINAL   Final     Studies/Results: Mr Brain Wo Contrast  10/14/2012   CLINICAL DATA:  79 year old female with fall, subsequently discovered to have left facial droop and left-sided weakness. Code stroke. History of metastatic cancer.  EXAM: MRI HEAD WITHOUT CONTRAST  MRA HEAD WITHOUT CONTRAST  TECHNIQUE: Multiplanar, multiecho pulse sequences of the brain and surrounding structures were obtained without intravenous contrast. Angiographic images of the head were obtained using MRA technique without contrast.  COMPARISON:  Head and cervical spine CT 10/13/2012.  FINDINGS: MRI HEAD FINDINGS  Gyriform restricted diffusion at the posterior right insula and posterior temporal operculum. Confluent wedge-shaped area of restricted diffusion in the poster right temporal lobe a or parietal lobe involving a 2 cm area. Scattered additional posterior right MCA territory small cortically based infarcts.  There is also evidence of several punctate or lacunar acute infarcts not in the  right MCA territory: Left parietal lobe on series 8, image 32, left inferior parietal lobe on image 27, and right posterior cerebellar hemisphere on image 10. Heterogeneous diffusion in the brainstem but no definite acute brainstem infarct.  Mild associated T2 and FLAIR hyperintensity in these areas. No associated mass effect. Punctate petechial hemorrhage in the right parietal lobe (series 9, image 16. Otherwise no intracranial hemorrhage.   Major intracranial vascular flow voids are preserved although there is asymmetric increased signal in some right posterior MCA branches on FLAIR imaging. See MRA findings below.  No midline shift, mass effect, or evidence of intracranial mass lesion. Ventricular size and configuration are within normal limits. Negative pituitary, cervicomedullary junction and visualized cervical spine. Increased fluid or mucosal thickening in the sphenoid sinus. Left posterior convexity scalp hematoma. No acute orbits soft tissue findings. Mastoids are clear.  MRA HEAD FINDINGS  Study is mildly degraded by motion artifact despite repeated imaging attempts.  Antegrade flow in the posterior circulation. Mildly dominant distal right vertebral artery. Distal vertebral artery irregularity compatible with atherosclerosis but no stenosis. Patent vertebrobasilar junction. Dominant AICA vessels. No basilar stenosis. SCA origins and left PCA origin are normal. Fetal type right PCA origin. Bilateral PCA branches are within normal limits.  Antegrade flow in both ICA siphons. ICA irregularity compatible with atherosclerosis but no subsequent stenosis. Ophthalmic and right posterior communicating artery origins are within normal limits. Left posterior communicating artery is diminutive or absent. Carotid termini, MCA and ACA origins are within normal limits. Mild motion artifact at the level of the anterior communicating artery. Visualized ACA branches are within normal limits. Left MCA M1 segment and visualized left MCA branches are within normal limits.  Right MCA M1 segment is within normal limits. Right MCA bifurcation is patent. No right MCA major branch occlusion is identified.  IMPRESSION: MRI HEAD IMPRESSION  1. 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 No mass effect. Punctate petechial hemorrhage in the right parietal lobe.  2. Several tiny acute infarcts also in the posterior left MCA territory and also  the right cerebellum. This raises the possibility of the recent embolic shower, but might instead represent synchronous small vessel ischemia.  3. Small left scalp hematoma.  MRA HEAD IMPRESSION  Negative intracranial MRA. Mild for age intracranial atherosclerosis with no hemodynamically significant stenosis or major right MCA or circle of Willis branch occlusion identified.   Electronically Signed   By: Augusto Gamble   On: 10/14/2012 11:44   US Paracentesis  10/14/2012   *RADIOLOGY REPORT*  Clinical Data: Metastatic adenocarcinoma, peritoneal carcinomatosis, recurrent ascites, abdominal pain.  Request is made for therapeutic paracentesis.  ULTRASOUND GUIDED THERAPEUTIC  PARACENTESIS  An ultrasound guided paracentesis was thoroughly discussed with the patient and questions answered.  The benefits, risks, alternatives and complications were also discussed.  The patient understands and wishes to proceed with the procedure.  Written consent was obtained.  Ultrasound was performed to localize and mark an adequate pocket of fluid in the right lower quadrant of the abdomen.  The area was then prepped and draped in the normal sterile fashion.  1% Lidocaine was used for local anesthesia.  Under ultrasound guidance a 19 gauge Yueh catheter was introduced.  Paracentesis was performed.  The catheter was removed and a dressing applied.  Complications:  none  Findings:  A total of approximately 3.9 liters of bloody fluid was removed.  IMPRESSION: Successful ultrasound guided therapeutic paracentesis yielding 3.9 liters of ascites.  Read by: Caryn Bee  Allred, P.A.-C   Original Report Authenticated By: Judie Petit. Miles Costain, M.D.   Dg Swallowing Func-speech Pathology  10/14/2012   Lacinda Axon, CCC-SLP     10/14/2012  5:55 PM Objective Swallowing Evaluation: Modified Barium Swallowing Study   Patient Details  Name: [name] MRN: [id number] Date of Birth: April 11, 1922  Today's Date: 10/14/2012 Time: 7829-5621 SLP Time Calculation (min): 33  min  Past Medical History:  Past Medical History  Diagnosis Date  . Cataract   . Diabetes mellitus   . Cancer    Past Surgical History:  Past Surgical History  Procedure Laterality Date  . Bladder surgery  06/1989  . Knee surgery  10/30/1993    Lt   . Back surgery  20/11/1998    ruptured disc  . Colon surgery    . Hernia repair     HPI:  ALAJA GOLDINGER is a 79 y.o. female with MPH of Metastatic  adenocarcinoma -carcinomatosis Dx- 2012 (was previous on  chemo-xeloda, pantimumamab, but stopped due to progression of  cancer/hospice), malignant ascites on weekly paracentesis, DM,  HTN, OSA, depression, chronic cancer related pain who woke up   early this morning with pain. Later she got up to use the  bathroom she had some difficulty then fell. When her aide found  her she was on the floor she had a left facial droop and was weak  on the left side. EMS was called and the patient was brought in  as a code stroke. Initial NIHSS of 8.  MBS ordered following  results of BSE.     Assessment / Plan / Recommendation Clinical Impression  Dysphagia Diagnosis: Moderate oral phase dysphagia;Mild  pharyngeal phase dysphagia;Moderate cervical esophageal phase  dysphagia Moderate sensory motor oral dysphagia marked by labial and  lingual, buccal weakness on left with decreased lingual  coordination.  Xerostomia noted.  Slow oral transit with  piecemeal swallows with all consistencies.  Moderate sensory  motor pharyngeal dysphagia marked by delay in initiation,  decreased TBR, decreased laryngeal elevation,  and  reduced  pharyngeal peristalsis.  Deep penetration during swallow of thin  liquid barium by straw with delayed throat clear.  Patient unable  to clear penetrates with instructed cough.  Modified  Cup sips  effective in eliminating further penetration.  Minimal residue in  vallecular space, posterior pharyngeal wall and pyriforms with  puree and mechanical soft consistencies but cleared with  spontaneous second swallow. Brief  esophageal sweep indicates   moderate to severe cervical esophageal dysphagia. No radiologist  present to confirm.   Diffuse stasis with noted backflow to  cervical portion with soft solids. Whole barium tablet retained  in thoracic portion requiring sips of thin liquid and dry  swallows to continue bolus transit.  Recommend to initiate  dysphagia 2 diet consistency with thin liquids by cup sips only  with total assist with each meal to provide cues as needed.   Recommend aspiration and strict reflux precautions as aspiration  risk remains high s/p swallow.  Recommend to crush medication as  able  and administer in puree consistency.  Diagnostic treatment  completed s/p evaluation focusing on providing diet  recommendations and strategies to improve safety to family  members, patient, and caregivers.  Patient may benefit from GI  consult to further assess esophageal functioning.  ST to follow  closely in acute care setting to monitor for aspiration and for  diet tolerance.  Defer diet advancement to treating SLP with  clinical improvement.  Diet Recommendation Dysphagia 2 (Fine chop);Thin liquid   Liquid Administration via: Cup;No straw Medication Administration: Crushed with puree Supervision: Full supervision/cueing for compensatory  strategies;Staff feed patient Compensations: Slow rate;Small sips/bites;Check for  pocketing;Multiple dry swallows after each bite/sip Postural Changes and/or Swallow Maneuvers: Seated upright 90  degrees;Upright 30-60 min after meal    Other  Recommendations Recommended Consults: Consider GI  evaluation Oral Care Recommendations: Oral care before and after PO Other Recommendations: Clarify dietary restrictions   Follow Up Recommendations  Inpatient Rehab    Frequency and Duration min 2x/week  2 weeks       SLP Swallow Goals Patient will consume recommended diet without observed clinical  signs of aspiration with: Minimal assistance Patient will utilize recommended strategies  during swallow to  increase swallowing safety with: Minimal assistance   General Date of Onset: 10/13/12 HPI: [name] is a 79 y.o. female with MPH of Metastatic  adenocarcinoma -carcinomatosis Dx- 2012 (was previous on  chemo-xeloda, pantimumamab, but stopped due to progression of  cancer/hospice), malignant ascites on weekly paracentesis, DM,  HTN, OSA, depression, chronic cancer related pain who woke up   early this morning with pain. Later she got up to use the  bathroom she had some difficulty then fell. When her aide found  her she was on the floor she had a left facial droop and was weak  on the left side. EMS was called and the patient was brought in  as a code stroke. Initial NIHSS of 8. Type of Study: Modified Barium Swallowing Study Reason for Referral: Objectively evaluate swallowing function Previous Swallow Assessment: BSE 10/14/12 NPO  Diet Prior to this Study: NPO Temperature Spikes Noted: No Respiratory Status: Supplemental O2 delivered via (comment)  (nasal cannula ) History of Recent Intubation: No Behavior/Cognition: Alert;Cooperative;Pleasant mood Oral Cavity - Dentition: Adequate natural dentition Oral Motor / Sensory Function: Impaired - see Bedside swallow  eval Self-Feeding Abilities: Needs assist Patient Positioning: Upright in chair Baseline Vocal Quality: Breathy;Low vocal intensity Volitional Cough: Strong Volitional Swallow: Able to elicit Anatomy: Within functional limits Pharyngeal Secretions: Not observed secondary MBS    Reason for Referral Objectively evaluate swallowing function   Oral Phase Oral Preparation/Oral Phase Oral Phase: Impaired Oral - Nectar Oral - Nectar Teaspoon: Weak lingual manipulation;Incomplete  tongue to palate contact;Reduced posterior propulsion;Piecemeal  swallowing;Lingual/palatal residue;Delayed oral transit Oral - Nectar Cup: Weak lingual manipulation;Incomplete tongue to  palate contact;Reduced posterior propulsion;Lingual/palatal   residue;Piecemeal swallowing;Delayed oral transit Oral - Nectar Straw: Weak lingual manipulation;Lingual/palatal  residue;Piecemeal swallowing Oral - Thin Oral - Thin Teaspoon: Lingual pumping;Incomplete tongue to palate  contact;Reduced posterior propulsion;Lingual/palatal  residue;Piecemeal swallowing;Delayed oral transit Oral - Thin Cup: Weak lingual manipulation;Reduced posterior  propulsion;Incomplete tongue to palate contact;Lingual/palatal  residue;Delayed oral transit Oral - Thin Straw: Weak lingual manipulation;Reduced posterior  propulsion;Lingual/palatal residue;Piecemeal swallowing;Delayed  oral transit Oral - Solids Oral - Puree: Weak lingual manipulation;Lingual pumping;Reduced  posterior propulsion;Lingual/palatal residue;Piecemeal  swallowing;Delayed oral transit Oral - Mechanical Soft: Impaired mastication;Reduced posterior  propulsion;Piecemeal swallowing;Lingual/palatal residue;Delayed  oral transit;Weak lingual manipulation   Pharyngeal Phase Pharyngeal Phase Pharyngeal Phase: Impaired Pharyngeal - Nectar Pharyngeal - Nectar Teaspoon: Premature spillage to  valleculae;Reduced tongue base retraction;Reduced laryngeal  elevation;Pharyngeal residue - valleculae;Reduced pharyngeal  peristalsis Pharyngeal - Nectar Cup: Premature spillage to  valleculae;Premature spillage to pyriform sinuses;Delayed swallow  initiation;Reduced pharyngeal peristalsis;Reduced laryngeal  elevation;Reduced tongue base retraction;Pharyngeal residue -  posterior pharnyx;Pharyngeal residue - pyriform sinuses Pharyngeal - Nectar Straw: Premature spillage to pyriform  sinuses;Reduced pharyngeal peristalsis;Delayed  swallow  initiation;Reduced tongue base retraction;Reduced laryngeal  elevation;Pharyngeal residue - posterior pharnyx;Pharyngeal  residue - pyriform sinuses;Pharyngeal residue - valleculae Pharyngeal - Thin Pharyngeal - Thin Teaspoon: Premature spillage to  valleculae;Premature spillage to pyriform sinuses;Delayed  swallow  initiation;Reduced pharyngeal peristalsis;Reduced tongue base  retraction;Pharyngeal residue - valleculae;Pharyngeal residue -  posterior pharnyx;Pharyngeal residue - pyriform sinuses Pharyngeal - Thin Cup: Premature spillage to pyriform  sinuses;Delayed swallow initiation;Reduced pharyngeal  peristalsis;Reduced laryngeal elevation;Reduced tongue base  retraction Pharyngeal - Thin Straw: Premature spillage to pyriform  sinuses;Reduced pharyngeal peristalsis;Delayed swallow  initiation;Reduced tongue base retraction;Penetration/Aspiration  during swallow;Pharyngeal residue - valleculae;Pharyngeal residue  - pyriform sinuses Penetration/Aspiration details (thin straw): Material enters  airway, CONTACTS cords and not ejected out Pharyngeal - Solids Pharyngeal - Puree: Premature spillage to pyriform  sinuses;Reduced pharyngeal peristalsis;Delayed swallow  initiation;Reduced laryngeal elevation;Reduced tongue base  retraction;Pharyngeal residue - valleculae;Pharyngeal residue -  posterior pharnyx;Pharyngeal residue - pyriform sinuses Pharyngeal - Mechanical Soft: Premature spillage to pyriform  sinuses;Reduced pharyngeal peristalsis;Delayed swallow  initiation;Reduced laryngeal elevation;Reduced tongue base  retraction;Pharyngeal residue - valleculae;Pharyngeal residue -  posterior pharnyx;Pharyngeal residue - pyriform sinuses Pharyngeal - Pill: Premature spillage to valleculae  Cervical Esophageal Phase    GO    Cervical Esophageal Phase Cervical Esophageal Phase: Impaired Cervical Esophageal Phase - Solids Puree: Prominent cricopharyngeal segment;Esophageal backflow into  cervical esophagus Mechanical Soft: Esophageal backflow into cervical  esophagus;Prominent cricopharyngeal segment Pill: Esophageal backflow into cervical esophagus;Prominent  cricopharyngeal segment        Moreen Fowler MS, CCC-SLP [phone] [facility] 10/14/2012, 5:37 PM    Mr Maxine Glenn Head/brain Wo Cm  10/14/2012   CLINICAL DATA:  79 year old  female with fall, subsequently discovered to have left facial droop and left-sided weakness. Code stroke. History of metastatic cancer.  EXAM: MRI HEAD WITHOUT CONTRAST  MRA HEAD WITHOUT CONTRAST  TECHNIQUE: Multiplanar, multiecho pulse sequences of the brain and surrounding structures were obtained without intravenous contrast. Angiographic images of the head were obtained using MRA technique without contrast.  COMPARISON:  Head and cervical spine CT 10/13/2012.  FINDINGS: MRI HEAD FINDINGS  Gyriform restricted diffusion at the posterior right insula and posterior temporal operculum. Confluent wedge-shaped area of restricted diffusion in the poster right temporal lobe a or parietal lobe involving a 2 cm area. Scattered additional posterior right MCA territory small cortically based infarcts.  There is also evidence of several punctate or lacunar acute infarcts not in the right MCA territory: Left parietal lobe on series 8, image 32, left inferior parietal lobe on image 27, and right posterior cerebellar hemisphere on image 10. Heterogeneous diffusion in the brainstem but no definite acute brainstem infarct.  Mild associated T2 and FLAIR hyperintensity in these areas. No associated mass effect. Punctate petechial hemorrhage in the right parietal lobe (series 9, image 16. Otherwise no intracranial hemorrhage.  Major intracranial vascular flow voids are preserved although there is asymmetric increased signal in some right posterior MCA branches on FLAIR imaging. See MRA findings below.  No midline shift, mass effect, or evidence of intracranial mass lesion. Ventricular size and configuration are within normal limits. Negative pituitary, cervicomedullary junction and visualized cervical spine. Increased fluid or mucosal thickening in the sphenoid sinus. Left posterior convexity scalp hematoma. No acute orbits soft tissue findings. Mastoids are clear.  MRA HEAD FINDINGS  Study is mildly degraded by motion artifact  despite repeated imaging attempts.  Antegrade flow in the posterior circulation. Mildly dominant distal right vertebral artery. Distal vertebral artery irregularity compatible with atherosclerosis but no stenosis. Patent  vertebrobasilar junction. Dominant AICA vessels. No basilar stenosis. SCA origins and left PCA origin are normal. Fetal type right PCA origin. Bilateral PCA branches are within normal limits.  Antegrade flow in both ICA siphons. ICA irregularity compatible with atherosclerosis but no subsequent stenosis. Ophthalmic and right posterior communicating artery origins are within normal limits. Left posterior communicating artery is diminutive or absent. Carotid termini, MCA and ACA origins are within normal limits. Mild motion artifact at the level of the anterior communicating artery. Visualized ACA branches are within normal limits. Left MCA M1 segment and visualized left MCA branches are within normal limits.  Right MCA M1 segment is within normal limits. Right MCA bifurcation is patent. No right MCA major branch occlusion is identified.  IMPRESSION: MRI HEAD IMPRESSION  1. 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 No mass effect. Punctate petechial hemorrhage in the right parietal lobe.  2. Several tiny acute infarcts also in the posterior left MCA territory and also the right cerebellum. This raises the possibility of the recent embolic shower, but might instead represent synchronous small vessel ischemia.  3. Small left scalp hematoma.  MRA HEAD IMPRESSION  Negative intracranial MRA. Mild for age intracranial atherosclerosis with no hemodynamically significant stenosis or major right MCA or circle of Willis branch occlusion identified.   Electronically Signed   By: Augusto Gamble   On: 10/14/2012 11:44    Medications: Scheduled Meds: . antiseptic oral rinse  15 mL Mouth Rinse BID  . aspirin  325 mg Oral Daily  . buPROPion  100 mg Oral Daily  . carvedilol  3.125  mg Oral BID WC  . cefTRIAXone (ROCEPHIN)  IV  1 g Intravenous Q24H  . polyethylene glycol  17 g Oral Daily  . potassium chloride SA  10 mEq Oral Daily  . venlafaxine XR  75 mg Oral Q breakfast   Continuous Infusions: . dextrose 5 % and 0.9% NaCl 50 mL/hr at 10/14/12 0023   PRN Meds:.morphine injection, ondansetron (ZOFRAN) IV, prochlorperazine, traMADol    LOS: 2 days   Mitchelle Sultan,CHRISTOPHER  [facility] Pager [phone]. If 8PM-8AM, please contact night-coverage at [email or web address], password [facility] 10/15/2012, 12:43 PM  LOS: 2 days

## 2012-10-15 NOTE — Progress Notes (Signed)
Stroke Team Progress Note  HISTORY [name] an 79 y.o. female who awakened early morning of 8/29. Was normal at that time. Later this morning when she got up to use the bathroom she had difficulty getting her pants down. She began to feel poorly and then fell. When her aide found her she was on the floor she had a left facial droop and was weak on the left side. EMS was called and the patient was brought in as a code stroke. Initial NIHSS of 8.  Patient has metastatic cancer of the appendix. Has paracentesis weekly. Is able to walk and talk independently buy is on hospice.  Patient was not a TPA candidate secondary to being outside the window. She was admitted to neuro unit for monitoring.  SUBJECTIVE Her daughter is at the bedside.  Overall she feels her condition is stable. Had MRI brain and SLP evaluation and MBS done in past 24hours. Started on dysphagia fine chopped diet. Had carotid vascular prelim read as normal. MRI of the brain shows scattered small foci of ischemia in multiple distributions concerning for embolic process. Of note, daughter mentions few weeks ago [name] noted to have swelling and erythema of LLE, had ultrasound done negative for stroke.  OBJECTIVE Most recent Vital Signs: Filed Vitals:   10/14/12 2000 10/15/12 0000 10/15/12 0400 10/15/12 0812  BP: 112/56 121/58 128/82 137/74  Pulse: 108 92 92 103  Temp: 98.3 F (36.8 C) 97.6 F (36.4 C) 97.4 F (36.3 C) 98.3 F (36.8 C)  TempSrc:  Oral Oral Oral  Resp: 17 20 20 20   Height:      Weight:      SpO2: 95% 96% 97% 100%   CBG (last 3)   Recent Labs  10/14/12 1140 10/14/12 1939 10/15/12 0148  GLUCAP 97 145* 114*    IV Fluid Intake:   . dextrose 5 % and 0.9% NaCl 50 mL/hr at 10/14/12 0023    MEDICATIONS  . antiseptic oral rinse  15 mL Mouth Rinse BID  . aspirin  325 mg Oral Daily  . buPROPion  100 mg Oral Daily  . carvedilol  3.125 mg Oral BID WC  . cefTRIAXone (ROCEPHIN)  IV  1 g Intravenous Q24H   . polyethylene glycol  17 g Oral Daily  . potassium chloride SA  10 mEq Oral Daily  . venlafaxine XR  75 mg Oral Q breakfast   PRN:  morphine injection, ondansetron (ZOFRAN) IV, prochlorperazine, traMADol  Diet:  Dysphagia  DVT Prophylaxis:  ambulation  CLINICALLY SIGNIFICANT STUDIES Basic Metabolic Panel:   Recent Labs Lab 10/13/12 0900 10/13/12 0906 10/15/12 0410  NA 141 142 141  K 4.2 4.2 3.4*  CL 105 103 106  CO2 26  --  27  GLUCOSE 147* 144* 136*  BUN 17 17 10   CREATININE 0.66 0.80 0.67  CALCIUM 8.7  --  8.2*   Liver Function Tests:   Recent Labs Lab 10/13/12 0900  AST 13  ALT 9  ALKPHOS 55  BILITOT <0.1*  PROT 6.0  ALBUMIN 2.2*   CBC:  Recent Labs Lab 10/13/12 0900  10/14/12 0455 10/15/12 0410  WBC 8.7  < > 6.0 7.2  NEUTROABS 6.3  --   --   --   HGB 6.8*  < > 7.0* 8.4*  HCT 24.0*  < > 23.6* 27.8*  MCV 72.7*  < > 74.0* 74.9*  PLT 381  < > 345 328  < > = values in this interval not  displayed. Coagulation:   Recent Labs Lab 10/13/12 0900  LABPROT 12.3  INR 0.93   Cardiac Enzymes:   Recent Labs Lab 10/13/12 0900  TROPONINI <0.30   Urinalysis:   Recent Labs Lab 10/13/12 1102  COLORURINE YELLOW  LABSPEC 1.020  PHURINE 5.0  GLUCOSEU NEGATIVE  HGBUR NEGATIVE  BILIRUBINUR NEGATIVE  KETONESUR NEGATIVE  PROTEINUR NEGATIVE  UROBILINOGEN 0.2  NITRITE POSITIVE*  LEUKOCYTESUR NEGATIVE   Lipid Panel    Component Value Date/Time   CHOL 121 10/14/2012 0455   TRIG 116 10/14/2012 0455   HDL 39* 10/14/2012 0455   CHOLHDL 3.1 10/14/2012 0455   VLDL 23 10/14/2012 0455   LDLCALC 59 10/14/2012 0455   HgbA1C  Lab Results  Component Value Date   HGBA1C 6.2* 10/14/2012    Urine Drug Screen:     Component Value Date/Time   LABOPIA NONE DETECTED 10/13/2012 1102   COCAINSCRNUR NONE DETECTED 10/13/2012 1102   LABBENZ NONE DETECTED 10/13/2012 1102   AMPHETMU NONE DETECTED 10/13/2012 1102   THCU NONE DETECTED 10/13/2012 1102   LABBARB NONE DETECTED  10/13/2012 1102    Alcohol Level:   Recent Labs Lab 10/13/12 0900  ETH <11    Dg Shoulder Right  10/13/2012   *RADIOLOGY REPORT*  Clinical Data: Right shoulder pain after fall.  RIGHT SHOULDER - 2+ VIEW  Comparison: None.  Findings: No fracture or dislocation is noted.  Visualized ribs appear normal.  Mild degenerative changes seen involving the right acromioclavicular joint.  There is severe degenerative change involving the right glenohumeral joint with deformity of right humeral head as a result.  Severe narrowing of the right acromiohumeral space is noted consistent with rotator cuff injury.  IMPRESSION: Severe degenerative joint disease of right glenohumeral joint is noted.  No acute fracture or dislocation is noted.  Severe narrowing of right acromiohumeral space is noted consistent with rotator cuff injury.   Original Report Authenticated By: Lupita Raider.,  M.D.   Ct Cervical Spine Wo Contrast  10/13/2012   *RADIOLOGY REPORT*  Clinical Data: Fall  CT CERVICAL SPINE WITHOUT CONTRAST  Technique:  Multidetector CT imaging of the cervical spine was performed. Multiplanar CT image reconstructions were also generated.  Comparison: 05/01/2007  Findings:  Axial images shows no acute fracture or subluxation. There is diffuse osteopenia.  There is no pneumothorax in visualized lung apices.  Loculated small right pleural effusion noted posteriorly.  Atherosclerotic calcifications of the thoracic aorta.  Computer processed images shows no acute fracture or subluxation. Degenerative changes are noted C1-C2 articulation.  Mild disc space flattening at C3-C4 level.  Mild to moderate disc space flattening with anterior spurring and mild posterior spurring at C4-C5 level. Significant disc space flattening with anterior spurring and vacuum disc phenomenon at C5-C6 level.  Significant disc space flattening with anterior spurring and partial bony fusion at C6-C7 level. Partial bony fusion noted at C 7 T1  vertebral body.  Disc space flattening with anterior spurring noted at T1 T2 level.  Mild spinal canal stenosis due to posterior spurring at C5-C6 level.  No prevertebral soft tissue swelling.  Cervical airway is patent.  IMPRESSION: No acute fracture or subluxation.  Diffuse osteopenia.  Multilevel degenerative changes as described above.  Small loculated right pleural effusion.   Original Report Authenticated By: Natasha Mead, M.D.   Mr Brain Wo Contrast  10/14/2012   CLINICAL DATA:  79 year old female with fall, subsequently discovered to have left facial droop and left-sided weakness. Code stroke. History of metastatic  cancer.  EXAM: MRI HEAD WITHOUT CONTRAST  MRA HEAD WITHOUT CONTRAST  TECHNIQUE: Multiplanar, multiecho pulse sequences of the brain and surrounding structures were obtained without intravenous contrast. Angiographic images of the head were obtained using MRA technique without contrast.  COMPARISON:  Head and cervical spine CT 10/13/2012.  FINDINGS: MRI HEAD FINDINGS  Gyriform restricted diffusion at the posterior right insula and posterior temporal operculum. Confluent wedge-shaped area of restricted diffusion in the poster right temporal lobe a or parietal lobe involving a 2 cm area. Scattered additional posterior right MCA territory small cortically based infarcts.  There is also evidence of several punctate or lacunar acute infarcts not in the right MCA territory: Left parietal lobe on series 8, image 32, left inferior parietal lobe on image 27, and right posterior cerebellar hemisphere on image 10. Heterogeneous diffusion in the brainstem but no definite acute brainstem infarct.  Mild associated T2 and FLAIR hyperintensity in these areas. No associated mass effect. Punctate petechial hemorrhage in the right parietal lobe (series 9, image 16. Otherwise no intracranial hemorrhage.  Major intracranial vascular flow voids are preserved although there is asymmetric increased signal in some right  posterior MCA branches on FLAIR imaging. See MRA findings below.  No midline shift, mass effect, or evidence of intracranial mass lesion. Ventricular size and configuration are within normal limits. Negative pituitary, cervicomedullary junction and visualized cervical spine. Increased fluid or mucosal thickening in the sphenoid sinus. Left posterior convexity scalp hematoma. No acute orbits soft tissue findings. Mastoids are clear.  MRA HEAD FINDINGS  Study is mildly degraded by motion artifact despite repeated imaging attempts.  Antegrade flow in the posterior circulation. Mildly dominant distal right vertebral artery. Distal vertebral artery irregularity compatible with atherosclerosis but no stenosis. Patent vertebrobasilar junction. Dominant AICA vessels. No basilar stenosis. SCA origins and left PCA origin are normal. Fetal type right PCA origin. Bilateral PCA branches are within normal limits.  Antegrade flow in both ICA siphons. ICA irregularity compatible with atherosclerosis but no subsequent stenosis. Ophthalmic and right posterior communicating artery origins are within normal limits. Left posterior communicating artery is diminutive or absent. Carotid termini, MCA and ACA origins are within normal limits. Mild motion artifact at the level of the anterior communicating artery. Visualized ACA branches are within normal limits. Left MCA M1 segment and visualized left MCA branches are within normal limits.  Right MCA M1 segment is within normal limits. Right MCA bifurcation is patent. No right MCA major branch occlusion is identified.  IMPRESSION: MRI HEAD IMPRESSION  1. 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 No mass effect. Punctate petechial hemorrhage in the right parietal lobe.  2. Several tiny acute infarcts also in the posterior left MCA territory and also the right cerebellum. This raises the possibility of the recent embolic shower, but might instead represent  synchronous small vessel ischemia.  3. Small left scalp hematoma.  MRA HEAD IMPRESSION  Negative intracranial MRA. Mild for age intracranial atherosclerosis with no hemodynamically significant stenosis or major right MCA or circle of Willis branch occlusion identified.   Electronically Signed   By: Augusto Gamble   On: 10/14/2012 11:44   US Paracentesis  10/14/2012   *RADIOLOGY REPORT*  Clinical Data: Metastatic adenocarcinoma, peritoneal carcinomatosis, recurrent ascites, abdominal pain.  Request is made for therapeutic paracentesis.  ULTRASOUND GUIDED THERAPEUTIC  PARACENTESIS  An ultrasound guided paracentesis was thoroughly discussed with the patient and questions answered.  The benefits, risks, alternatives and complications were also discussed.  The  patient understands and wishes to proceed with the procedure.  Written consent was obtained.  Ultrasound was performed to localize and mark an adequate pocket of fluid in the right lower quadrant of the abdomen.  The area was then prepped and draped in the normal sterile fashion.  1% Lidocaine was used for local anesthesia.  Under ultrasound guidance a 19 gauge Yueh catheter was introduced.  Paracentesis was performed.  The catheter was removed and a dressing applied.  Complications:  none  Findings:  A total of approximately 3.9 liters of bloody fluid was removed.  IMPRESSION: Successful ultrasound guided therapeutic paracentesis yielding 3.9 liters of ascites.  Read by: Jeananne Rama, P.A.-C   Original Report Authenticated By: Judie Petit. Miles Costain, M.D.   Dg Swallowing Func-speech Pathology  10/14/2012   Lacinda Axon, CCC-SLP     10/14/2012  5:55 PM Objective Swallowing Evaluation: Modified Barium Swallowing Study   Patient Details  Name: [name] MRN: [id number] Date of Birth: 1922/03/25  Today's Date: 10/14/2012 Time: 4696-2952 SLP Time Calculation (min): 33 min  Past Medical History:  Past Medical History  Diagnosis Date  . Cataract   . Diabetes mellitus   . Cancer     Past Surgical History:  Past Surgical History  Procedure Laterality Date  . Bladder surgery  06/1989  . Knee surgery  10/30/1993    Lt   . Back surgery  20/11/1998    ruptured disc  . Colon surgery    . Hernia repair     HPI:  [name] is a 79 y.o. female with MPH of Metastatic  adenocarcinoma -carcinomatosis Dx- 2012 (was previous on  chemo-xeloda, pantimumamab, but stopped due to progression of  cancer/hospice), malignant ascites on weekly paracentesis, DM,  HTN, OSA, depression, chronic cancer related pain who woke up   early this morning with pain. Later she got up to use the  bathroom she had some difficulty then fell. When her aide found  her she was on the floor she had a left facial droop and was weak  on the left side. EMS was called and the patient was brought in  as a code stroke. Initial NIHSS of 8.  MBS ordered following  results of BSE.     Assessment / Plan / Recommendation Clinical Impression  Dysphagia Diagnosis: Moderate oral phase dysphagia;Mild  pharyngeal phase dysphagia;Moderate cervical esophageal phase  dysphagia Moderate sensory motor oral dysphagia marked by labial and  lingual, buccal weakness on left with decreased lingual  coordination.  Xerostomia noted.  Slow oral transit with  piecemeal swallows with all consistencies.  Moderate sensory  motor pharyngeal dysphagia marked by delay in initiation,  decreased TBR, decreased laryngeal elevation,  and  reduced  pharyngeal peristalsis.  Deep penetration during swallow of thin  liquid barium by straw with delayed throat clear.  Patient unable  to clear penetrates with instructed cough.  Modified  Cup sips  effective in eliminating further penetration.  Minimal residue in  vallecular space, posterior pharyngeal wall and pyriforms with  puree and mechanical soft consistencies but cleared with  spontaneous second swallow. Brief esophageal sweep indicates   moderate to severe cervical esophageal dysphagia. No radiologist  present to  confirm.   Diffuse stasis with noted backflow to  cervical portion with soft solids. Whole barium tablet retained  in thoracic portion requiring sips of thin liquid and dry  swallows to continue bolus transit.  Recommend to initiate  dysphagia 2 diet consistency with thin liquids  by cup sips only  with total assist with each meal to provide cues as needed.   Recommend aspiration and strict reflux precautions as aspiration  risk remains high s/p swallow.  Recommend to crush medication as  able  and administer in puree consistency.  Diagnostic treatment  completed s/p evaluation focusing on providing diet  recommendations and strategies to improve safety to family  members, patient, and caregivers.  Patient may benefit from GI  consult to further assess esophageal functioning.  ST to follow  closely in acute care setting to monitor for aspiration and for  diet tolerance.  Defer diet advancement to treating SLP with  clinical improvement.         Diet Recommendation Dysphagia 2 (Fine chop);Thin liquid   Liquid Administration via: Cup;No straw Medication Administration: Crushed with puree Supervision: Full supervision/cueing for compensatory  strategies;Staff feed patient Compensations: Slow rate;Small sips/bites;Check for  pocketing;Multiple dry swallows after each bite/sip Postural Changes and/or Swallow Maneuvers: Seated upright 90  degrees;Upright 30-60 min after meal    Other  Recommendations Recommended Consults: Consider GI  evaluation Oral Care Recommendations: Oral care before and after PO Other Recommendations: Clarify dietary restrictions   Follow Up Recommendations  Inpatient Rehab    Frequency and Duration min 2x/week  2 weeks       SLP Swallow Goals Patient will consume recommended diet without observed clinical  signs of aspiration with: Minimal assistance Patient will utilize recommended strategies during swallow to  increase swallowing safety with: Minimal assistance   General Date of Onset: 10/13/12  HPI: [name] is a 79 y.o. female with MPH of Metastatic  adenocarcinoma -carcinomatosis Dx- 2012 (was previous on  chemo-xeloda, pantimumamab, but stopped due to progression of  cancer/hospice), malignant ascites on weekly paracentesis, DM,  HTN, OSA, depression, chronic cancer related pain who woke up   early this morning with pain. Later she got up to use the  bathroom she had some difficulty then fell. When her aide found  her she was on the floor she had a left facial droop and was weak  on the left side. EMS was called and the patient was brought in  as a code stroke. Initial NIHSS of 8. Type of Study: Modified Barium Swallowing Study Reason for Referral: Objectively evaluate swallowing function Previous Swallow Assessment: BSE 10/14/12 NPO  Diet Prior to this Study: NPO Temperature Spikes Noted: No Respiratory Status: Supplemental O2 delivered via (comment)  (nasal cannula ) History of Recent Intubation: No Behavior/Cognition: Alert;Cooperative;Pleasant mood Oral Cavity - Dentition: Adequate natural dentition Oral Motor / Sensory Function: Impaired - see Bedside swallow  eval Self-Feeding Abilities: Needs assist Patient Positioning: Upright in chair Baseline Vocal Quality: Breathy;Low vocal intensity Volitional Cough: Strong Volitional Swallow: Able to elicit Anatomy: Within functional limits Pharyngeal Secretions: Not observed secondary MBS    Reason for Referral Objectively evaluate swallowing function   Oral Phase Oral Preparation/Oral Phase Oral Phase: Impaired Oral - Nectar Oral - Nectar Teaspoon: Weak lingual manipulation;Incomplete  tongue to palate contact;Reduced posterior propulsion;Piecemeal  swallowing;Lingual/palatal residue;Delayed oral transit Oral - Nectar Cup: Weak lingual manipulation;Incomplete tongue to  palate contact;Reduced posterior propulsion;Lingual/palatal  residue;Piecemeal swallowing;Delayed oral transit Oral - Nectar Straw: Weak lingual manipulation;Lingual/palatal   residue;Piecemeal swallowing Oral - Thin Oral - Thin Teaspoon: Lingual pumping;Incomplete tongue to palate  contact;Reduced posterior propulsion;Lingual/palatal  residue;Piecemeal swallowing;Delayed oral transit Oral - Thin Cup: Weak lingual manipulation;Reduced posterior  propulsion;Incomplete tongue to palate contact;Lingual/palatal  residue;Delayed oral transit Oral - Thin Straw: Weak  lingual manipulation;Reduced posterior  propulsion;Lingual/palatal residue;Piecemeal swallowing;Delayed  oral transit Oral - Solids Oral - Puree: Weak lingual manipulation;Lingual pumping;Reduced  posterior propulsion;Lingual/palatal residue;Piecemeal  swallowing;Delayed oral transit Oral - Mechanical Soft: Impaired mastication;Reduced posterior  propulsion;Piecemeal swallowing;Lingual/palatal residue;Delayed  oral transit;Weak lingual manipulation   Pharyngeal Phase Pharyngeal Phase Pharyngeal Phase: Impaired Pharyngeal - Nectar Pharyngeal - Nectar Teaspoon: Premature spillage to  valleculae;Reduced tongue base retraction;Reduced laryngeal  elevation;Pharyngeal residue - valleculae;Reduced pharyngeal  peristalsis Pharyngeal - Nectar Cup: Premature spillage to  valleculae;Premature spillage to pyriform sinuses;Delayed swallow  initiation;Reduced pharyngeal peristalsis;Reduced laryngeal  elevation;Reduced tongue base retraction;Pharyngeal residue -  posterior pharnyx;Pharyngeal residue - pyriform sinuses Pharyngeal - Nectar Straw: Premature spillage to pyriform  sinuses;Reduced pharyngeal peristalsis;Delayed swallow  initiation;Reduced tongue base retraction;Reduced laryngeal  elevation;Pharyngeal residue - posterior pharnyx;Pharyngeal  residue - pyriform sinuses;Pharyngeal residue - valleculae Pharyngeal - Thin Pharyngeal - Thin Teaspoon: Premature spillage to  valleculae;Premature spillage to pyriform sinuses;Delayed swallow  initiation;Reduced pharyngeal peristalsis;Reduced tongue base  retraction;Pharyngeal residue -  valleculae;Pharyngeal residue -  posterior pharnyx;Pharyngeal residue - pyriform sinuses Pharyngeal - Thin Cup: Premature spillage to pyriform  sinuses;Delayed swallow initiation;Reduced pharyngeal  peristalsis;Reduced laryngeal elevation;Reduced tongue base  retraction Pharyngeal - Thin Straw: Premature spillage to pyriform  sinuses;Reduced pharyngeal peristalsis;Delayed swallow  initiation;Reduced tongue base retraction;Penetration/Aspiration  during swallow;Pharyngeal residue - valleculae;Pharyngeal residue  - pyriform sinuses Penetration/Aspiration details (thin straw): Material enters  airway, CONTACTS cords and not ejected out Pharyngeal - Solids Pharyngeal - Puree: Premature spillage to pyriform  sinuses;Reduced pharyngeal peristalsis;Delayed swallow  initiation;Reduced laryngeal elevation;Reduced tongue base  retraction;Pharyngeal residue - valleculae;Pharyngeal residue -  posterior pharnyx;Pharyngeal residue - pyriform sinuses Pharyngeal - Mechanical Soft: Premature spillage to pyriform  sinuses;Reduced pharyngeal peristalsis;Delayed swallow  initiation;Reduced laryngeal elevation;Reduced tongue base  retraction;Pharyngeal residue - valleculae;Pharyngeal residue -  posterior pharnyx;Pharyngeal residue - pyriform sinuses Pharyngeal - Pill: Premature spillage to valleculae  Cervical Esophageal Phase    GO    Cervical Esophageal Phase Cervical Esophageal Phase: Impaired Cervical Esophageal Phase - Solids Puree: Prominent cricopharyngeal segment;Esophageal backflow into  cervical esophagus Mechanical Soft: Esophageal backflow into cervical  esophagus;Prominent cricopharyngeal segment Pill: Esophageal backflow into cervical esophagus;Prominent  cricopharyngeal segment        Moreen Fowler Breanna, CCC-SLP [phone] [facility] 10/14/2012, 5:37 PM    Mr Maxine Glenn Head/brain Wo Cm  10/14/2012   CLINICAL DATA:  79 year old female with fall, subsequently discovered to have left facial droop and left-sided weakness. Code  stroke. History of metastatic cancer.  EXAM: MRI HEAD WITHOUT CONTRAST  MRA HEAD WITHOUT CONTRAST  TECHNIQUE: Multiplanar, multiecho pulse sequences of the brain and surrounding structures were obtained without intravenous contrast. Angiographic images of the head were obtained using MRA technique without contrast.  COMPARISON:  Head and cervical spine CT 10/13/2012.  FINDINGS: MRI HEAD FINDINGS  Gyriform restricted diffusion at the posterior right insula and posterior temporal operculum. Confluent wedge-shaped area of restricted diffusion in the poster right temporal lobe a or parietal lobe involving a 2 cm area. Scattered additional posterior right MCA territory small cortically based infarcts.  There is also evidence of several punctate or lacunar acute infarcts not in the right MCA territory: Left parietal lobe on series 8, image 32, left inferior parietal lobe on image 27, and right posterior cerebellar hemisphere on image 10. Heterogeneous diffusion in the brainstem but no definite acute brainstem infarct.  Mild associated T2 and FLAIR hyperintensity in these areas. No associated mass effect. Punctate petechial hemorrhage in the right parietal lobe (series 9, image 16. Otherwise  no intracranial hemorrhage.  Major intracranial vascular flow voids are preserved although there is asymmetric increased signal in some right posterior MCA branches on FLAIR imaging. See MRA findings below.  No midline shift, mass effect, or evidence of intracranial mass lesion. Ventricular size and configuration are within normal limits. Negative pituitary, cervicomedullary junction and visualized cervical spine. Increased fluid or mucosal thickening in the sphenoid sinus. Left posterior convexity scalp hematoma. No acute orbits soft tissue findings. Mastoids are clear.  MRA HEAD FINDINGS  Study is mildly degraded by motion artifact despite repeated imaging attempts.  Antegrade flow in the posterior circulation. Mildly dominant distal  right vertebral artery. Distal vertebral artery irregularity compatible with atherosclerosis but no stenosis. Patent vertebrobasilar junction. Dominant AICA vessels. No basilar stenosis. SCA origins and left PCA origin are normal. Fetal type right PCA origin. Bilateral PCA branches are within normal limits.  Antegrade flow in both ICA siphons. ICA irregularity compatible with atherosclerosis but no subsequent stenosis. Ophthalmic and right posterior communicating artery origins are within normal limits. Left posterior communicating artery is diminutive or absent. Carotid termini, MCA and ACA origins are within normal limits. Mild motion artifact at the level of the anterior communicating artery. Visualized ACA branches are within normal limits. Left MCA M1 segment and visualized left MCA branches are within normal limits.  Right MCA M1 segment is within normal limits. Right MCA bifurcation is patent. No right MCA major branch occlusion is identified.  IMPRESSION: MRI HEAD IMPRESSION  1. 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 No mass effect. Punctate petechial hemorrhage in the right parietal lobe.  2. Several tiny acute infarcts also in the posterior left MCA territory and also the right cerebellum. This raises the possibility of the recent embolic shower, but might instead represent synchronous small vessel ischemia.  3. Small left scalp hematoma.  MRA HEAD IMPRESSION  Negative intracranial MRA. Mild for age intracranial atherosclerosis with no hemodynamically significant stenosis or major right MCA or circle of Willis branch occlusion identified.   Electronically Signed   By: Augusto Gamble   On: 10/14/2012 11:44   Physical Exam   Mental Status:  Alert, oriented, pleasant, appropriate speech. Speech fluent, mild dysarthria. Able to follow 3 step commands without difficulty.  Cranial Nerves:  PERRL, blinks to threat bilat, EOMI, decreased LT on L face, L facial droop, decreased  hearing bilat(told is chronic), tongue midline Motor:  Right : Upper extremity 5/5 Left: Upper extremity 5-/5  Lower extremity 5/5 Lower extremity 4+/5; external rotation noted  Tone and bulk:normal tone throughout; no atrophy noted  Sensory: Pinprick and light touch intact decreased on the left  Deep Tendon Reflexes: 2+ and symmetric with absent AJ's bilaterally  Plantars:  Right: downgoing Left: upgoing  Cerebellar:  normal finger-to-nose  Gait: not tested  ASSESSMENT [name] is a 79 y.o. female presenting with weakness and left facial droop. TPA not given as outside the window.CT head shows no signs of acute process. MRI brain showing scattered small acute infarcts in R MCA, L MCA and R posterior circulation territories. Strongly suspect embolic etiology from unknown source. History of swollen/erythematous LLE raises concern for PFO and possible distal embolic source.  Hospital day # 2  TREATMENT/PLAN  1)awaiting 2D echo, would consider TEE if negative. No signs of LE DVT on exam 2)PT, OT and speech 3) Follow up official report carotid ultrasound 4) continue ASA 81mg  daily 5 Would consider addition of DVT ppx 6) No statin as  LDL <100 7) Telemetry monitoring  8)Anemia workup per primary team    HPI  Review of Systems  Physical Exam

## 2012-10-15 NOTE — Progress Notes (Signed)
Speech Language Pathology Dysphagia Treatment Patient Details Name: [name] MRN: [id number] DOB: 1922/06/18 Today's Date: 10/15/2012 Time: 2130-8657 SLP Time Calculation (min): 25 min  Assessment / Plan / Recommendation Clinical Impression  F/u from MBS completed on 11/14/12 for diet tolerance of dysphagia 2 and thin liquids.  Treatment focused on providing education to patient and nursing on recommended swallow strategies and reflux precautions.  Min verbal cues for patient to correctly state recommendations.  Nursing reports coughing during initial part of  evening meal but reported when repostitioned to more upright position coughing stopped.  Recommend to continue current diet consistency with full supervison with  All meals as patient continues to require verbal cues to complete strategies.  ST to continue in acute care setting for diet tolerance.      Diet Recommendation  Continue with Current Diet: Dysphagia 2 (fine chop);Thin liquid    SLP Plan Continue with current plan of care      Swallowing Goals  SLP Swallowing Goals Swallow Study Goal #1 - Progress: Progressing toward goal Swallow Study Goal #2 - Progress: Progressing toward goal  General Temperature Spikes Noted: No Respiratory Status: Supplemental O2 delivered via (comment) (nasal cannula ) Behavior/Cognition: Alert;Cooperative;Pleasant mood;Distractible Oral Cavity - Dentition: Adequate natural dentition Patient Positioning: Upright in bed  Oral Cavity - Oral Hygiene Does patient have any of the following "at risk" factors?: Oxygen therapy - cannula, mask, simple oxygen devices;Other - dysphagia;Saliva - thick, dry mouth Patient is HIGH RISK - Oral Care Protocol followed (see row info): Yes Patient is AT RISK - Oral Care Protocol followed (see row info): Yes   Dysphagia Treatment Treatment focused on: Patient/family/caregiver education Family/Caregiver Educated: Nursing    GO  Moreen Fowler MS,  CCC-SLP [phone]  [facility] 10/15/2012, 7:00 PM

## 2012-10-15 NOTE — Progress Notes (Signed)
Patient admitted with CVA.  DNR on chart.  Found patient resting, FLACC score 0.  Reviewed chart and patient's MRI yesterday revealed small scattered infarcts.  Patient receiving PT/OT/ST therapies and ASA prophylaxis.  Patient tolerating dysphagia 2 diet without complications.  Continue current plan of care and anticipate discharge home with continued Hospice services.  Encourage a call to Hospice at [phone] with any needs.  April Costella Hatcher, RN, BSN Hospice

## 2012-10-16 DIAGNOSIS — I6350 Cerebral infarction due to unspecified occlusion or stenosis of unspecified cerebral artery: Secondary | ICD-10-CM

## 2012-10-16 LAB — BASIC METABOLIC PANEL
CO2: 30 mEq/L (ref 19–32)
Calcium: 8.5 mg/dL (ref 8.4–10.5)
Creatinine, Ser: 0.62 mg/dL (ref 0.50–1.10)
GFR calc non Af Amer: 78 mL/min — ABNORMAL LOW (ref >90)
Glucose, Bld: 144 mg/dL — ABNORMAL HIGH (ref 70–99)

## 2012-10-16 LAB — CBC
MCH: 23.2 pg — ABNORMAL LOW (ref 26.0–34.0)
MCHC: 30 g/dL (ref 30.0–36.0)
MCV: 77.3 fL — ABNORMAL LOW (ref 78.0–100.0)
Platelets: 336 10*3/uL (ref 150–400)
RBC: 4.36 MIL/uL (ref 3.87–5.11)

## 2012-10-16 LAB — TYPE AND SCREEN
ABO/RH(D): O NEG
Unit division: 0
Unit division: 0

## 2012-10-16 LAB — GLUCOSE, CAPILLARY
Glucose-Capillary: 143 mg/dL — ABNORMAL HIGH (ref 70–99)
Glucose-Capillary: 135 mg/dL — ABNORMAL HIGH (ref 70–99)

## 2012-10-16 MED ORDER — STROKE: EARLY STAGES OF RECOVERY BOOK
Freq: Once | Status: AC
  Administered 2012-10-17: 08:00:00
  Filled 2012-10-16: qty 1

## 2012-10-16 NOTE — Progress Notes (Signed)
CSW (Clinical Child psychotherapist) referred pt out to [facility]. SNFs. Clinicals have been sent to [facility] and CSW called rep and left message to inform. CSW made clear that pt is considering SNF as backup for CIR.   Kristina Mcnorton, LCSWA [phone]

## 2012-10-16 NOTE — Progress Notes (Signed)
Occupational Therapy Treatment Patient Details Name: [name] MRN: [id number] DOB: Jun 14, 1922 Today's Date: 10/16/2012 Time: 5784-6962 OT Time Calculation (min): 45 min  OT Assessment / Plan / Recommendation  History of present illness [name] an 79 y.o. female who awakened early morning of 8/29. Was normal at that time. Later this morning when she got up to use the bathroom she had difficulty getting her pants down. She began to feel poorly and then fell. When her aide found her she was on the floor she had a left facial droop and was weak on the left side. EMS was called and the patient was brought in as a code stroke. Initial NIHSS of 8.  Brain MRI revealed scattered small acute posterior right MCA territory infarcts without mass effect, punctate petechial hemorrhage in the right parietal lobe, as well as several tiny acute infarcts also in the posterior left MCA territory and also the right cerebellum.   OT comments  Pt making steady progress. Pt with apparent L inattention with visual spatiall deficits affecting her independence with ADL and mobility. Feel pt would benefit from CIR to return to PLOF and decrease caregiver burden.   Follow Up Recommendations  CIR    Barriers to Discharge       Equipment Recommendations  None recommended by OT    Recommendations for Other Services Rehab consult  Frequency Min 3X/week   Progress towards OT Goals Progress towards OT goals: Progressing toward goals  Plan Discharge plan remains appropriate    Precautions / Restrictions Precautions Precautions: Fall Precaution Comments: L inattention Restrictions Weight Bearing Restrictions: No   Pertinent Vitals/Pain no apparent distress O2 RA 92 end of session    ADL  Transfers/Ambulation Related to ADLs: Min A sit - stand. L bias ADL Comments: demonstrates L bias during ADL. Unable to maintain midline posture when distracted. apparent L inattention    OT Diagnosis:    OT Problem  List:   OT Treatment Interventions:     OT Goals(current goals can now be found in the care plan section) Acute Rehab OT Goals Patient Stated Goal: to go home, family wants her to get a little stronger and more independent before returning home.   OT Goal Formulation: With patient/family Time For Goal Achievement: 10/28/12 Potential to Achieve Goals: Good ADL Goals Pt Will Perform Grooming: with min assist;standing Pt Will Transfer to Toilet: with min assist;ambulating;bedside commode Additional ADL Goal #1: Pt will be able to come up to sit with Min A in prep for BADLs and mobility Additional ADL Goal #2: Pt will be S for attending to her LUE during ADLs  Visit Information  Last OT Received On: 10/16/12 Assistance Needed: +1 History of Present Illness: [name] an 79 y.o. female who awakened early morning of 8/29. Was normal at that time. Later this morning when she got up to use the bathroom she had difficulty getting her pants down. She began to feel poorly and then fell. When her aide found her she was on the floor she had a left facial droop and was weak on the left side. EMS was called and the patient was brought in as a code stroke. Initial NIHSS of 8.  Brain MRI revealed scattered small acute posterior right MCA territory infarcts without mass effect, punctate petechial hemorrhage in the right parietal lobe, as well as several tiny acute infarcts also in the posterior left MCA territory and also the right cerebellum.    Subjective Data  Prior Functioning       Cognition  Cognition Arousal/Alertness: Awake/alert Behavior During Therapy: WFL for tasks assessed/performed Overall Cognitive Status: Impaired/Different from baseline Area of Impairment: Attention;Memory;Following commands;Safety/judgement;Awareness;Problem solving Current Attention Level: Sustained Memory: Decreased short-term memory Following Commands: Follows one step commands  consistently Safety/Judgement: Decreased awareness of safety;Decreased awareness of deficits Awareness: Emergent Problem Solving: Slow processing;Difficulty sequencing;Requires verbal cues General Comments: Unaware where her LUE is in space after she places it somewhere (quits visually attending to it).  She also needs verbal cues for midline sitting posture and midline gait with RW.      Mobility  Bed Mobility Bed Mobility: Not assessed (pt seated EOB when PT/OT entered room) Transfers Transfers: Sit to Stand;Stand to Sit Sit to Stand: 4: Min assist;With upper extremity assist;From bed Stand to Sit: 4: Min assist;With upper extremity assist;To chair/3-in-1 Details for Transfer Assistance: L bias    Exercises      Balance Static Standing Balance Static Standing - Balance Support: Bilateral upper extremity supported Static Standing - Level of Assistance: 4: Min assist   End of Session OT - End of Session Equipment Utilized During Treatment: Gait belt;Other (comment) (rollator) Activity Tolerance: Patient tolerated treatment well Patient left: in chair;with call bell/phone within reach;with family/visitor present Nurse Communication: Mobility status  GO     Mistey Hoffert,HILLARY 10/16/2012, 3:25 PM [facility], OTR/L  [phone] 10/16/2012

## 2012-10-16 NOTE — Progress Notes (Signed)
Rehab Admissions Coordinator Note:  Patient was screened by Brock Ra for appropriateness for an Inpatient Acute Rehab Consult.  At this time, we are recommending Inpatient Rehab consult.  Brock Ra 10/16/2012, 8:10 AM  I can be reached at [phone].

## 2012-10-16 NOTE — Progress Notes (Signed)
Echo Lab  2D Echocardiogram completed.  Achsah Mcquade L Davis Ambrosini, RDCS 10/16/2012 11:20 AM

## 2012-10-16 NOTE — Progress Notes (Signed)
Physical Therapy Treatment Patient Details Name: [name] MRN: [id number] DOB: 1922-08-05 Today's Date: 10/16/2012 Time: 1310-1407 (only charged for 3 units due to socail visit x 15 min) PT Time Calculation (min): 57 min  PT Assessment / Plan / Recommendation  History of Present Illness [name] an 79 y.o. female who awakened early morning of 8/29. Was normal at that time. Later this morning when she got up to use the bathroom she had difficulty getting her pants down. She began to feel poorly and then fell. When her aide found her she was on the floor she had a left facial droop and was weak on the left side. EMS was called and the patient was brought in as a code stroke. Initial NIHSS of 8.  Brain MRI revealed scattered small acute posterior right MCA territory infarcts without mass effect, punctate petechial hemorrhage in the right parietal lobe, as well as several tiny acute infarcts also in the posterior left MCA territory and also the right cerebellum.   PT Comments   The pt is progressing well with her mobility (even within the session she is showing great potential for improvement).  She was able to use rollator today with cues for spatial awareness, finding midline in RW, and to attend to left hand grip.  Min cues needed for safe use of rollator.  The pt is progressing well and is still an appropriate rehab candidate.  Family would like for her to return to her prior level of independence (walking short distances in the house with rollator without external assist needed).    Follow Up Recommendations  CIR     Does the patient have the potential to tolerate intense rehabilitation    Yes  Barriers to Discharge   None      Equipment Recommendations  None recommended by PT    Recommendations for Other Services   None  Frequency Min 4X/week   Progress towards PT Goals Progress towards PT goals: Progressing toward goals  Plan Current plan remains appropriate    Precautions /  Restrictions Precautions Precautions: Fall Precaution Comments: Left side inattention (OT assessing for neglect) Restrictions Weight Bearing Restrictions: No   Pertinent Vitals/Pain Bil shoulder pain R> L chronic, RN aware and giving pain meds at end of session.  HR at rest 112 bpm, O2 sats on RA at rest 93-94%, during gait HR increased to 130 bom, O2 sats ranged from 90-92% on RA, after giat HR 112 bpm, O2 sats 87-88% increasing to 90-92% on RA <1 min seated rest break.  Pt left off of O2, RN aware.      Mobility  Bed Mobility Bed Mobility: Not assessed (pt seated EOB when PT/OT entered room) Transfers Transfers: Sit to Stand;Stand to Sit Sit to Stand: 4: Min assist;With upper extremity assist;From bed;With armrests;Other (comment) (from rollator) Stand to Sit: 4: Min assist;With upper extremity assist;With armrests;To chair/3-in-1 (to rollator) Details for Transfer Assistance: Verbal cues for safety and hand placement.  Min assist of trunk during transitions.   Ambulation/Gait Ambulation/Gait Assistance: 4: Min assist;1: +2 Total assist Ambulation Distance (Feet): 150 Feet (150' total with one seated rest break due to fatigue) Assistive device: 4-wheeled walker Ambulation/Gait Assistance Details: initially started with two person assist for safety, but pt was able to get down to one person min assist.  Pt needed max verbal cues for midline gait in rollator and she was initially leaning her trunk and listing her feet to the left side of the rollator.  She also needed cues to maintain grip of left hand on rollator (she never fully lost her grip, but her hand did start to supinate when not attended to).   Gait Pattern: Step-through pattern;Shuffle;Lateral trunk lean to left;Narrow base of support General Gait Details: HR and O2 sats 1/2 way with seated rest break 130 bpm, 90-91% on RA.  After walking: HR 120, O2 sats 87-88% and quickly rose to 90-92% on RA with seated rest break.  RN aware and  O2 left off of pt while sitting in chair.   Modified Rankin (Stroke Patients Only) Pre-Morbid Rankin Score: Moderately severe disability Modified Rankin: Moderately severe disability      PT Goals (current goals can now be found in the care plan section) Acute Rehab PT Goals Patient Stated Goal: to go home, family wants her to get a little stronger and more independent before returning home.    Visit Information  Last PT Received On: 10/16/12 Assistance Needed: +1 PT/OT Co-Evaluation/Treatment: Yes History of Present Illness: [name] an 79 y.o. female who awakened early morning of 8/29. Was normal at that time. Later this morning when she got up to use the bathroom she had difficulty getting her pants down. She began to feel poorly and then fell. When her aide found her she was on the floor she had a left facial droop and was weak on the left side. EMS was called and the patient was brought in as a code stroke. Initial NIHSS of 8.  Brain MRI revealed scattered small acute posterior right MCA territory infarcts without mass effect, punctate petechial hemorrhage in the right parietal lobe, as well as several tiny acute infarcts also in the posterior left MCA territory and also the right cerebellum.    Subjective Data  Subjective: Pt alert, seated EOB eating frozen yogurt, talking with daughter and son-in-law.   Patient Stated Goal: to go home, family wants her to get a little stronger and more independent before returning home.     Cognition  Cognition Arousal/Alertness: Awake/alert Behavior During Therapy: WFL for tasks assessed/performed Overall Cognitive Status: Impaired/Different from baseline Area of Impairment: Awareness;Safety/judgement Safety/Judgement: Decreased awareness of safety;Decreased awareness of deficits Awareness: Intellectual General Comments: Unaware where her LUE is in space after she places it somewhere (quits visually attending to it).  She also needs verbal  cues for midline sitting posture and midline gait with RW.      Balance  Static Standing Balance Static Standing - Balance Support: Bilateral upper extremity supported Static Standing - Level of Assistance: 4: Min assist  End of Session PT - End of Session Equipment Utilized During Treatment: Gait belt Activity Tolerance: Patient limited by fatigue Patient left: in chair;with call bell/phone within reach;with family/visitor present     Lurena Joiner B. Gerhardt Gleed, PT, DPT [phone]   10/16/2012, 2:36 PM

## 2012-10-16 NOTE — Progress Notes (Signed)
Stroke Team Progress Note  HISTORY [name] an 79 y.o. female who awakened early morning of 8/29. Was normal at that time. Later this morning when she got up to use the bathroom she had difficulty getting her pants down. She began to feel poorly and then fell. When her aide found her she was on the floor she had a left facial droop and was weak on the left side. EMS was called and the patient was brought in as a code stroke. Initial NIHSS of 8.  Patient has metastatic cancer of the appendix. Has paracentesis weekly. Is able to walk and talk independently buy is on hospice.  Patient was not a TPA candidate secondary to being outside the window. She was admitted to neuro unit for monitoring.  SUBJECTIVE Her daughter is at the bedside.  Overall she feels her condition is stable. Daughter has multiple questions in regards to next step. Patient is currently at cardiology for echo  OBJECTIVE Most recent Vital Signs: Filed Vitals:   10/15/12 2000 10/16/12 0000 10/16/12 0400 10/16/12 0729  BP: 120/96 137/64 134/72 146/80  Pulse: 116 105 71 117  Temp: 97.9 F (36.6 C) 98.4 F (36.9 C) 97.9 F (36.6 C) 97.9 F (36.6 C)  TempSrc: Oral Oral Oral Oral  Resp: 22 17 17 21   Height: 5\' 1"  (1.549 m)     Weight: 83.008 kg (183 lb)     SpO2: 100% 96% 96% 96%   CBG (last 3)   Recent Labs  10/15/12 1632 10/15/12 1951 10/16/12 0735  GLUCAP 107* 140* 120*    IV Fluid Intake:      MEDICATIONS  . antiseptic oral rinse  15 mL Mouth Rinse BID  . aspirin  325 mg Oral Daily  . buPROPion  100 mg Oral Daily  . carvedilol  3.125 mg Oral BID WC  . polyethylene glycol  17 g Oral Daily  . potassium chloride SA  10 mEq Oral Daily  . venlafaxine XR  75 mg Oral Q breakfast   PRN:  morphine injection, ondansetron (ZOFRAN) IV, prochlorperazine, traMADol  Diet:  Dysphagia  DVT Prophylaxis:  ambulation  CLINICALLY SIGNIFICANT STUDIES Basic Metabolic Panel:   Recent Labs Lab 10/15/12 0410  10/16/12 0548  NA 141 142  K 3.4* 4.4  CL 106 105  CO2 27 30  GLUCOSE 136* 144*  BUN 10 11  CREATININE 0.67 0.62  CALCIUM 8.2* 8.5   Liver Function Tests:   Recent Labs Lab 10/13/12 0900  AST 13  ALT 9  ALKPHOS 55  BILITOT <0.1*  PROT 6.0  ALBUMIN 2.2*   CBC:  Recent Labs Lab 10/13/12 0900  10/15/12 0410 10/16/12 0548  WBC 8.7  < > 7.2 8.5  NEUTROABS 6.3  --   --   --   HGB 6.8*  < > 8.4* 10.1*  HCT 24.0*  < > 27.8* 33.7*  MCV 72.7*  < > 74.9* 77.3*  PLT 381  < > 328 336  < > = values in this interval not displayed. Coagulation:   Recent Labs Lab 10/13/12 0900  LABPROT 12.3  INR 0.93   Cardiac Enzymes:   Recent Labs Lab 10/13/12 0900  TROPONINI <0.30   Urinalysis:   Recent Labs Lab 10/13/12 1102  COLORURINE YELLOW  LABSPEC 1.020  PHURINE 5.0  GLUCOSEU NEGATIVE  HGBUR NEGATIVE  BILIRUBINUR NEGATIVE  KETONESUR NEGATIVE  PROTEINUR NEGATIVE  UROBILINOGEN 0.2  NITRITE POSITIVE*  LEUKOCYTESUR NEGATIVE   Lipid Panel    Component  Value Date/Time   CHOL 121 10/14/2012 0455   TRIG 116 10/14/2012 0455   HDL 39* 10/14/2012 0455   CHOLHDL 3.1 10/14/2012 0455   VLDL 23 10/14/2012 0455   LDLCALC 59 10/14/2012 0455   HgbA1C  Lab Results  Component Value Date   HGBA1C 6.2* 10/14/2012    Urine Drug Screen:     Component Value Date/Time   LABOPIA NONE DETECTED 10/13/2012 1102   COCAINSCRNUR NONE DETECTED 10/13/2012 1102   LABBENZ NONE DETECTED 10/13/2012 1102   AMPHETMU NONE DETECTED 10/13/2012 1102   THCU NONE DETECTED 10/13/2012 1102   LABBARB NONE DETECTED 10/13/2012 1102    Alcohol Level:   Recent Labs Lab 10/13/12 0900  ETH <11    Dg Swallowing Func-speech Pathology  10/14/2012   Lacinda Axon, CCC-SLP     10/14/2012  5:55 PM Objective Swallowing Evaluation: Modified Barium Swallowing Study   Patient Details  Name: [name] MRN: [id number] Date of Birth: 01/20/1923  Today's Date: 10/14/2012 Time: 4098-1191 SLP Time Calculation (min): 33  min  Past Medical History:  Past Medical History  Diagnosis Date  . Cataract   . Diabetes mellitus   . Cancer    Past Surgical History:  Past Surgical History  Procedure Laterality Date  . Bladder surgery  06/1989  . Knee surgery  10/30/1993    Lt   . Back surgery  20/11/1998    ruptured disc  . Colon surgery    . Hernia repair     HPI:  [name] is a 79 y.o. female with MPH of Metastatic  adenocarcinoma -carcinomatosis Dx- 2012 (was previous on  chemo-xeloda, pantimumamab, but stopped due to progression of  cancer/hospice), malignant ascites on weekly paracentesis, DM,  HTN, OSA, depression, chronic cancer related pain who woke up   early this morning with pain. Later she got up to use the  bathroom she had some difficulty then fell. When her aide found  her she was on the floor she had a left facial droop and was weak  on the left side. EMS was called and the patient was brought in  as a code stroke. Initial NIHSS of 8.  MBS ordered following  results of BSE.     Assessment / Plan / Recommendation Clinical Impression  Dysphagia Diagnosis: Moderate oral phase dysphagia;Mild  pharyngeal phase dysphagia;Moderate cervical esophageal phase  dysphagia Moderate sensory motor oral dysphagia marked by labial and  lingual, buccal weakness on left with decreased lingual  coordination.  Xerostomia noted.  Slow oral transit with  piecemeal swallows with all consistencies.  Moderate sensory  motor pharyngeal dysphagia marked by delay in initiation,  decreased TBR, decreased laryngeal elevation,  and  reduced  pharyngeal peristalsis.  Deep penetration during swallow of thin  liquid barium by straw with delayed throat clear.  Patient unable  to clear penetrates with instructed cough.  Modified  Cup sips  effective in eliminating further penetration.  Minimal residue in  vallecular space, posterior pharyngeal wall and pyriforms with  puree and mechanical soft consistencies but cleared with  spontaneous second swallow. Brief  esophageal sweep indicates   moderate to severe cervical esophageal dysphagia. No radiologist  present to confirm.   Diffuse stasis with noted backflow to  cervical portion with soft solids. Whole barium tablet retained  in thoracic portion requiring sips of thin liquid and dry  swallows to continue bolus transit.  Recommend to initiate  dysphagia 2 diet consistency with thin liquids by cup  sips only  with total assist with each meal to provide cues as needed.   Recommend aspiration and strict reflux precautions as aspiration  risk remains high s/p swallow.  Recommend to crush medication as  able  and administer in puree consistency.  Diagnostic treatment  completed s/p evaluation focusing on providing diet  recommendations and strategies to improve safety to family  members, patient, and caregivers.  Patient may benefit from GI  consult to further assess esophageal functioning.  ST to follow  closely in acute care setting to monitor for aspiration and for  diet tolerance.  Defer diet advancement to treating SLP with  clinical improvement.         Diet Recommendation Dysphagia 2 (Fine chop);Thin liquid   Liquid Administration via: Cup;No straw Medication Administration: Crushed with puree Supervision: Full supervision/cueing for compensatory  strategies;Staff feed patient Compensations: Slow rate;Small sips/bites;Check for  pocketing;Multiple dry swallows after each bite/sip Postural Changes and/or Swallow Maneuvers: Seated upright 90  degrees;Upright 30-60 min after meal    Other  Recommendations Recommended Consults: Consider GI  evaluation Oral Care Recommendations: Oral care before and after PO Other Recommendations: Clarify dietary restrictions   Follow Up Recommendations  Inpatient Rehab    Frequency and Duration min 2x/week  2 weeks       SLP Swallow Goals Patient will consume recommended diet without observed clinical  signs of aspiration with: Minimal assistance Patient will utilize recommended strategies  during swallow to  increase swallowing safety with: Minimal assistance   General Date of Onset: 10/13/12 HPI: [name] is a 79 y.o. female with MPH of Metastatic  adenocarcinoma -carcinomatosis Dx- 2012 (was previous on  chemo-xeloda, pantimumamab, but stopped due to progression of  cancer/hospice), malignant ascites on weekly paracentesis, DM,  HTN, OSA, depression, chronic cancer related pain who woke up   early this morning with pain. Later she got up to use the  bathroom she had some difficulty then fell. When her aide found  her she was on the floor she had a left facial droop and was weak  on the left side. EMS was called and the patient was brought in  as a code stroke. Initial NIHSS of 8. Type of Study: Modified Barium Swallowing Study Reason for Referral: Objectively evaluate swallowing function Previous Swallow Assessment: BSE 10/14/12 NPO  Diet Prior to this Study: NPO Temperature Spikes Noted: No Respiratory Status: Supplemental O2 delivered via (comment)  (nasal cannula ) History of Recent Intubation: No Behavior/Cognition: Alert;Cooperative;Pleasant mood Oral Cavity - Dentition: Adequate natural dentition Oral Motor / Sensory Function: Impaired - see Bedside swallow  eval Self-Feeding Abilities: Needs assist Patient Positioning: Upright in chair Baseline Vocal Quality: Breathy;Low vocal intensity Volitional Cough: Strong Volitional Swallow: Able to elicit Anatomy: Within functional limits Pharyngeal Secretions: Not observed secondary MBS    Reason for Referral Objectively evaluate swallowing function   Oral Phase Oral Preparation/Oral Phase Oral Phase: Impaired Oral - Nectar Oral - Nectar Teaspoon: Weak lingual manipulation;Incomplete  tongue to palate contact;Reduced posterior propulsion;Piecemeal  swallowing;Lingual/palatal residue;Delayed oral transit Oral - Nectar Cup: Weak lingual manipulation;Incomplete tongue to  palate contact;Reduced posterior propulsion;Lingual/palatal   residue;Piecemeal swallowing;Delayed oral transit Oral - Nectar Straw: Weak lingual manipulation;Lingual/palatal  residue;Piecemeal swallowing Oral - Thin Oral - Thin Teaspoon: Lingual pumping;Incomplete tongue to palate  contact;Reduced posterior propulsion;Lingual/palatal  residue;Piecemeal swallowing;Delayed oral transit Oral - Thin Cup: Weak lingual manipulation;Reduced posterior  propulsion;Incomplete tongue to palate contact;Lingual/palatal  residue;Delayed oral transit Oral - Thin Straw: Weak lingual manipulation;Reduced  posterior  propulsion;Lingual/palatal residue;Piecemeal swallowing;Delayed  oral transit Oral - Solids Oral - Puree: Weak lingual manipulation;Lingual pumping;Reduced  posterior propulsion;Lingual/palatal residue;Piecemeal  swallowing;Delayed oral transit Oral - Mechanical Soft: Impaired mastication;Reduced posterior  propulsion;Piecemeal swallowing;Lingual/palatal residue;Delayed  oral transit;Weak lingual manipulation   Pharyngeal Phase Pharyngeal Phase Pharyngeal Phase: Impaired Pharyngeal - Nectar Pharyngeal - Nectar Teaspoon: Premature spillage to  valleculae;Reduced tongue base retraction;Reduced laryngeal  elevation;Pharyngeal residue - valleculae;Reduced pharyngeal  peristalsis Pharyngeal - Nectar Cup: Premature spillage to  valleculae;Premature spillage to pyriform sinuses;Delayed swallow  initiation;Reduced pharyngeal peristalsis;Reduced laryngeal  elevation;Reduced tongue base retraction;Pharyngeal residue -  posterior pharnyx;Pharyngeal residue - pyriform sinuses Pharyngeal - Nectar Straw: Premature spillage to pyriform  sinuses;Reduced pharyngeal peristalsis;Delayed swallow  initiation;Reduced tongue base retraction;Reduced laryngeal  elevation;Pharyngeal residue - posterior pharnyx;Pharyngeal  residue - pyriform sinuses;Pharyngeal residue - valleculae Pharyngeal - Thin Pharyngeal - Thin Teaspoon: Premature spillage to  valleculae;Premature spillage to pyriform sinuses;Delayed  swallow  initiation;Reduced pharyngeal peristalsis;Reduced tongue base  retraction;Pharyngeal residue - valleculae;Pharyngeal residue -  posterior pharnyx;Pharyngeal residue - pyriform sinuses Pharyngeal - Thin Cup: Premature spillage to pyriform  sinuses;Delayed swallow initiation;Reduced pharyngeal  peristalsis;Reduced laryngeal elevation;Reduced tongue base  retraction Pharyngeal - Thin Straw: Premature spillage to pyriform  sinuses;Reduced pharyngeal peristalsis;Delayed swallow  initiation;Reduced tongue base retraction;Penetration/Aspiration  during swallow;Pharyngeal residue - valleculae;Pharyngeal residue  - pyriform sinuses Penetration/Aspiration details (thin straw): Material enters  airway, CONTACTS cords and not ejected out Pharyngeal - Solids Pharyngeal - Puree: Premature spillage to pyriform  sinuses;Reduced pharyngeal peristalsis;Delayed swallow  initiation;Reduced laryngeal elevation;Reduced tongue base  retraction;Pharyngeal residue - valleculae;Pharyngeal residue -  posterior pharnyx;Pharyngeal residue - pyriform sinuses Pharyngeal - Mechanical Soft: Premature spillage to pyriform  sinuses;Reduced pharyngeal peristalsis;Delayed swallow  initiation;Reduced laryngeal elevation;Reduced tongue base  retraction;Pharyngeal residue - valleculae;Pharyngeal residue -  posterior pharnyx;Pharyngeal residue - pyriform sinuses Pharyngeal - Pill: Premature spillage to valleculae  Cervical Esophageal Phase    GO    Cervical Esophageal Phase Cervical Esophageal Phase: Impaired Cervical Esophageal Phase - Solids Puree: Prominent cricopharyngeal segment;Esophageal backflow into  cervical esophagus Mechanical Soft: Esophageal backflow into cervical  esophagus;Prominent cricopharyngeal segment Pill: Esophageal backflow into cervical esophagus;Prominent  cricopharyngeal segment        Breanna Fowler MS, CCC-SLP [phone] [facility] 10/14/2012, 5:37 PM    Vascular:Preliminary report: 1-39% ICA stenosis. Vertebral artery  flow is antegrade.  MRI 1. Positive for scattered small acute posterior right MCA territory  infarcts. No mass effect. Punctate petechial hemorrhage in the right  parietal lobe.  2. Several tiny acute infarcts also in the posterior left MCA  territory and also the right cerebellum. This raises the possibility  of the recent embolic shower, but might instead represent  synchronous small vessel ischemia.  3. Small left scalp hematoma  MRA HEAD IMPRESSION  Negative intracranial MRA. Mild for age intracranial atherosclerosis  with no hemodynamically significant stenosis or major right MCA or  circle of Willis branch occlusion identified   Patient at echo, no exam completed today. Counseled patients family on current plan of care and next steps in regards to rehab  ASSESSMENT [name] is a 79 y.o. female presenting with weakness and left facial droop. TPA not given as outside the window.CT head shows no signs of acute process. MRI brain showing scattered small acute infarcts in R MCA, L MCA and R posterior circulation territories. Strongly suspect embolic etiology from unknown source. Change to full strength aspirin   LDL 59  Metastatic adenocarcinoma  Hypertension  Diabetes mellitus hgb A1c, 6.2  Hospital day # 3  TREATMENT/PLAN  1)awaiting 2D echo results 2)PT, OT and speech 3) No statin as LDL <60 4) Telemetry monitoring  5)Anemia workup per primary team  Elspeth Cho, DO Neurology-Stroke  HPI  Review of Systems  Physical Exam

## 2012-10-16 NOTE — Progress Notes (Signed)
[facility] PROGRESS NOTE  [name] ZOX:[id number] DOB: 1922-06-25 DOA: 10/13/2012 PCP: Pearla Dubonnet, MD  Assessment/Plan: Active Problems:   CVA (cerebral infarction)    1. Acute CVA/Lt sided weakness/Dysarthria: Patient presented following a fall, and was found to have left facial droop and left-sided weakness. This clinical picture is consistent with acute right MCA territory CVA, although metastatic disease was considered a possibility. Head CT scan was negative for acute findings. Dr Thana Farr provided neurology consultation, and CVA work up was commenced. Brain MRI revealed scattered small acute posterior right MCA territory infarcts without mass effect, punctate petechial hemorrhage in the right parietal lobe, as well as several tiny acute infarcts also in the posterior left MCA territory and also the right cerebellum. SLP has evaluated, MBS done, and patient is now on D2/Thin. Vascular doppler revealed 1-39% ICA stenosis. Vertebral artery flow is antegrade. Managing with low dose ASA. 2D Echocardiogram is pending. Patient was evaluated by PT/OT, and CIR is recommended.  2. Metastatic adenocarcinoma/Recurrent Ascites: Patient has known advanced malignancy, of probable appendiceal vs colonic origin, complicated by carcinomatosis and recurrent malignant ascites. Originally diagnosed in 07/2010. Patient has been treated with chemotherapy, but this was subsequently discontinued. Now on Hospice, and undergoes weekly therapeutic paracentesis. Patient is s/p U/S-Guided paracentesis on 10/13/12, yielding a total of approximately 3.9 liters of bloody fluid. Perhaps, patient will benefit from a PleurX catheter. Will discuss with primary oncologist, Dr Mancel Bale, on 10/17/12.  3. Anemia/Positive FOBT: HB at presentation, was found to be low, at 6.8, MCV 72.8. FOBT was positive. Etiology is likely from chronic disease, malignancy, occult GI blood loss and bloody ascites. No obvious  rectal bleeding noticed by patient, except hemorrhoids and no features of acute bleeding. Transfused a total of 3 units PRBC and HB is 10.1 today. Following CBC. 4. Fall/Scalp Hematoma: Circumstances are not clear, but is likely to be multi-factorial, secondary to probable CVA, and generalized weakness/Anemia. Fortunately, imaging studies show no evidence of acute bony injuries, although head CT scan revealed a left parietal scalp hematoma. Not problematic.  5. HTN: BP is reasonable at this time, in context of possible acute CVA.  6. Query UTI:  Patient had a positive urinary sediment, with bacteremia. Initially treated with iv Rocephin, but as urine culture revealed multiple bacterial morphotypes, consistent with possible contamination, antibiotics were discontinued on 10/15/12, after 3 days of therapy.  7. DM: CBGs appear controlled at this time. Metformin is on hold. HBA1C is 6.2.  8. Query OSA:  Per patient's daughter, patient has been on nocturnal CPAP, for about 10 years. We shall continue.    Code Status: DNR.  Family Communication:  Disposition Plan: To be determined.    Brief narrative: 79 y.o. 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 Later she got up to use the bathroom she had some difficulty, then fell. When her aide found her, she was on the floor, had a left facial droop and was weak on the left side. EMS was called and the patient was brought in as a code stroke. Initial NIHSS of 8. Patient also found to have anemia Hg-6.8 with + occult blood. No TPA due to metastatic CA and anemia/GIB. Admitted for  further  evaluation and management.    Consultants:  Dr Thana Farr, neurologist.  Procedures:  Head CT scan.  CXR.  Pelvic X-Ray.  CT Cervical spine.  X-Ray right shoulder.   U/S-Guided paracentesis.   Antibiotics:  Rocephin 10/13/12-10/15/12.   HPI/Subjective: Feels much better. Ambulated with PT/OT.   Objective: Vital signs in last 24 hours: Temp:  [97.4 F (36.3 C)-98.4 F (36.9 C)] 97.4 F (36.3 C) (09/01 1627) Pulse Rate:  [71-118] 118 (09/01 1657) Resp:  [17-22] 22 (09/01 1657) BP: (120-146)/(64-96) 145/71 mmHg (09/01 1657) SpO2:  [90 %-100 %] 92 % (09/01 1657) Weight:  [83.008 kg (183 lb)] 83.008 kg (183 lb) (08/31 2000) Weight change:  Last BM Date: 10/12/12  Intake/Output from previous day: 08/31 0701 - 09/01 0700 In: 830 [P.O.:480; Blood:350] Out: -  Total I/O In: 240 [P.O.:240] Out: -    Physical Exam: General: Comfortable in chair, eating, alert, communicative, fully oriented, not short of breath at rest.  HEENT:  Mild clinical pallor, no jaundice, no conjunctival injection or discharge. Hydration is fair.  NECK:  Supple, JVP not seen, no carotid bruits, no palpable lymphadenopathy, no palpable goiter. CHEST:  Clinically clear to auscultation, no wheezes, no crackles. HEART:  Sounds 1 and 2 heard, normal, regular, no murmurs. ABDOMEN:  Moderately obese, soft, non-tender, no palpable organomegaly, no palpable masses, normal bowel sounds. GENITALIA:  Not examined. LOWER EXTREMITIES:  No pitting edema, palpable peripheral pulses. MUSCULOSKELETAL SYSTEM:  Generalized osteoarthritic changes, otherwise, normal. CENTRAL NERVOUS SYSTEM:  Patient has mild left facial asymmetry and mild LUE weakness. focal neurologic deficit on gross examination.  Lab Results:  Recent Labs  10/15/12 0410 10/16/12 0548  WBC 7.2 8.5  HGB 8.4* 10.1*  HCT 27.8* 33.7*  PLT 328 336    Recent Labs  10/15/12 0410 10/16/12 0548  NA 141 142  K 3.4* 4.4  CL  106 105  CO2 27 30  GLUCOSE 136* 144*  BUN 10 11  CREATININE 0.67 0.62  CALCIUM 8.2* 8.5   Recent Results (from the past 240 hour(s))  URINE CULTURE     Status: None   Collection Time    10/13/12 11:02 AM      Result Value Range Status   Specimen Description URINE, RANDOM   Final   Special Requests NONE   Final   Culture  Setup Time     Final   Value: 10/13/2012 12:21     Performed at [facility] Count     Final   Value: >=100,000 COLONIES/ML     Performed at [facility]   Culture     Final   Value: Multiple bacterial morphotypes present, none predominant. Suggest appropriate recollection if clinically indicated.     Performed at [facility]   Report Status 10/15/2012 FINAL   Final     Studies/Results: No results found.  Medications: Scheduled Meds: . antiseptic oral rinse  15 mL Mouth Rinse BID  . aspirin  325 mg Oral Daily  . buPROPion  100 mg Oral Daily  . carvedilol  3.125 mg Oral BID WC  . polyethylene glycol  17 g Oral Daily  . potassium chloride SA  10 mEq Oral Daily  . venlafaxine XR  75 mg Oral Q breakfast   Continuous Infusions:   PRN Meds:.morphine injection, ondansetron (ZOFRAN) IV, prochlorperazine, traMADol    LOS: 3 days   Nasif Bos,CHRISTOPHER  [facility] Pager [phone]. If 8PM-8AM, please contact night-coverage at [email or web address], password [facility]  10/16/2012, 5:36 PM  LOS: 3 days

## 2012-10-16 NOTE — Progress Notes (Signed)
[facility] ([facility]): Sw note:  On-call Sw for [facility] met with Pt and and her daughter to discuss their understanding of DC plans for Pt from the hospital. They reported to this Sw that they believe that Pt will go to "a rehab either at the hospital or somewhere else" per daughter. This Sw provided education about Hospice Pt's that go to SNF rehab facilities, they verbalized understanding that Pt will revoke her Hospice benefit prior to entering SNF/rehab. [facility] will continue to follow for support during hospital stay. Please contact [facility] with any concerns or planned discharge date. [address] [place], [facility]SW [phone]

## 2012-10-16 NOTE — Progress Notes (Addendum)
Clinical Social Work Department CLINICAL SOCIAL WORK PLACEMENT NOTE 10/16/2012  Patient:  [name], [name]  Account Number:  [id number] Admit date:  10/13/2012  Clinical Social Worker:  Sharol Harness, Theresia Majors  Date/time:  10/16/2012 11:20 AM  Clinical Social Work is seeking post-discharge placement for this patient at the following level of care:   SKILLED NURSING   (*CSW will update this form in Epic as items are completed)   10/14/2012  Patient/family provided with [facility] Department of Clinical Social Work's list of facilities offering this level of care within the geographic area requested by the patient (or if unable, by the patient's family).  10/14/2012  Patient/family informed of their freedom to choose among providers that offer the needed level of care, that participate in Medicare, Medicaid or managed care program needed by the patient, have an available bed and are willing to accept the patient.  10/14/2012  Patient/family informed of [facility]' ownership interest in [facility], as well as of the fact that they are under no obligation to receive care at this facility.  PASARR submitted to EDS on 10/16/2012 PASARR number received from EDS on   FL2 transmitted to all facilities in geographic area requested by pt/family on  10/16/2012 FL2 transmitted to all facilities within larger geographic area on   Patient informed that his/her managed care company has contracts with or will negotiate with  certain facilities, including the following:     Patient/family informed of bed offers received: 10/17/2012  Patient chooses bed at  Physician recommends and patient chooses bed at    Patient to be transferred to  on   Patient to be transferred to facility by   The following physician request were entered in Epic:   Additional Comments:  Davion Meara, LCSWA [phone]

## 2012-10-17 DIAGNOSIS — I6340 Cerebral infarction due to embolism of unspecified cerebral artery: Secondary | ICD-10-CM

## 2012-10-17 LAB — GLUCOSE, CAPILLARY
Glucose-Capillary: 94 mg/dL (ref 70–99)
Glucose-Capillary: 155 mg/dL — ABNORMAL HIGH (ref 70–99)

## 2012-10-17 MED ORDER — FENTANYL 25 MCG/HR TD PT72
25.0000 ug | MEDICATED_PATCH | TRANSDERMAL | Status: DC
  Administered 2012-10-17: 25 ug via TRANSDERMAL
  Filled 2012-10-17: qty 1

## 2012-10-17 MED ORDER — RESOURCE THICKENUP CLEAR PO POWD
ORAL | Status: DC | PRN
  Filled 2012-10-17 (×2): qty 125

## 2012-10-17 NOTE — Consult Note (Addendum)
Physical Medicine and Rehabilitation Consult Reason for Consult: CVA Referring Physician: [facility]   HPI: [name] is a 79 y.o. right-handed female with history of metastatic adenocarcinoma diagnosed 2012.previous chemotherapy treatments but stopped due to progression of her cancer, malignant ascites, diabetes mellitus peripheral neuropathy. Patient independent with rolling walker prior to admission. Admitted 10/13/2012 after a fall with noted slurred speech and left-sided weakness. MRI of the brain showed scattered small acute posterior right MCA territory infarcts. Several tiny acute infarcts also in the posterior left MCA territory. MRA of the head negative. Echocardiogram with ejection fraction of 55% normal systolic function. Carotid Dopplers with no ICA stenosis. Await plan for possible TEE. Patient did not receive TPA. Neurology services consulted placed on aspirin therapy for CVA prophylaxis. Speech therapy for dysphagia currently maintained on a dysphagia 2 thin liquid diet. Noted chronic anemia hemoglobin 6.8 patient has been transfused with latest hemoglobin 10.1. Patient is being followed by hospice care for history of progressive metastatic adenocarcinoma. She does undergo weekly therapeutic paracentesis for chronic ascites and followed by oncology services Dr. Arnoldo Lenis. Physical and occupational therapy evaluations have been completed with recommendations of physical medicine rehabilitation consult to consider inpatient rehabilitation services.   Review of Systems  Gastrointestinal:       Abdominal ascites  Musculoskeletal: Positive for back pain.  Neurological: Positive for weakness.  Psychiatric/Behavioral: Positive for depression.  All other systems reviewed and are negative.   Past Medical History  Diagnosis Date  . Cataract   . Diabetes mellitus   . Cancer    Past Surgical History  Procedure Laterality Date  . Bladder surgery  06/1989  . Knee surgery  10/30/1993     Lt   . Back surgery  20/11/1998    ruptured disc  . Colon surgery    . Hernia repair     History reviewed. No pertinent family history. Social History:  reports that she has never smoked. She has never used smokeless tobacco. She reports that she does not drink alcohol. Her drug history is not on file. Allergies:  Allergies  Allergen Reactions  . Oxycodone Other (See Comments)    hallucinations  . Ace Inhibitors Other (See Comments)    Reaction unknown  . Ciprofloxacin Hcl Other (See Comments)    Reaction unknown  . Hydrocodone-Acetaminophen Other (See Comments)    Reports made her "crazy"   Medications Prior to Admission  Medication Sig Dispense Refill  . aspirin 81 MG tablet Take 81 mg by mouth daily.        Marland Kitchen buPROPion (WELLBUTRIN SR) 100 MG 12 hr tablet Take 100 mg by mouth daily.       . carvedilol (COREG) 3.125 MG tablet Take 3.125 mg by mouth 2 (two) times daily with a meal.        . furosemide (LASIX) 20 MG tablet Take 40 mg by mouth daily.       Marland Kitchen KLOR-CON M20 20 MEQ tablet Take 10 mEq by mouth daily.       Marland Kitchen lisinopril (PRINIVIL,ZESTRIL) 5 MG tablet Take 5 mg by mouth daily.        . meloxicam (MOBIC) 15 MG tablet Take 15 mg by mouth daily.       . metFORMIN (GLUCOPHAGE) 500 MG tablet Take 500 mg by mouth 2 (two) times daily.       . ondansetron (ZOFRAN) 8 MG tablet Take 1 tablet (8 mg total) by mouth every 12 (twelve) hours as needed for nausea.  60 tablet  1  . polyethylene glycol (MIRALAX / GLYCOLAX) packet Take 17 g by mouth daily.      . prochlorperazine (COMPAZINE) 5 MG tablet Take 1 tablet (5 mg total) by mouth every 6 (six) hours as needed for nausea.  30 tablet  0  . senna (SENOKOT) 8.6 MG tablet Take 1 tablet by mouth 2 (two) times daily.       . traMADol (ULTRAM) 50 MG tablet Take 100 mg by mouth every 6 (six) hours as needed for pain.      Marland Kitchen venlafaxine (EFFEXOR) 75 MG tablet Take 75 mg by mouth daily.         Home: Home Living Family/patient expects to  be discharged to:: Inpatient rehab Living Arrangements: Spouse/significant other Available Help at Discharge: Personal care attendant;Available 24 hours/day (spouse's aide 24. pt aide for bathing and dressing) Type of Home: House Home Access: Ramped entrance Home Layout: One level Home Equipment: Walker - 2 wheels;Shower seat;Walker - 4 wheels;Bedside commode  Functional History:   Functional Status:  Mobility: Bed Mobility Bed Mobility: Not assessed (pt seated EOB when PT/OT entered room) Supine to Sit: 3: Mod assist;HOB flat;With rails Sitting - Scoot to Edge of Bed: 4: Min guard Transfers Transfers: Sit to Stand;Stand to Sit Sit to Stand: 4: Min assist;With upper extremity assist;From bed Stand to Sit: 4: Min assist;With upper extremity assist;To chair/3-in-1 Ambulation/Gait Ambulation/Gait Assistance: 4: Min assist;1: +2 Total assist Ambulation/Gait: Patient Percentage: 50% Ambulation Distance (Feet): 150 Feet (150' total with one seated rest break due to fatigue) Assistive device: 4-wheeled walker Ambulation/Gait Assistance Details: initially started with two person assist for safety, but pt was able to get down to one person min assist.  Pt needed max verbal cues for midline gait in rollator and she was initially leaning her trunk and listing her feet to the left side of the rollator.  She also needed cues to maintain grip of left hand on rollator (she never fully lost her grip, but her hand did start to supinate when not attended to).   Gait Pattern: Step-through pattern;Shuffle;Lateral trunk lean to left;Narrow base of support Gait velocity: decreased General Gait Details: HR and O2 sats 1/2 way with seated rest break 130 bpm, 90-91% on RA.  After walking: HR 120, O2 sats 87-88% and quickly rose to 90-92% on RA with seated rest break.  RN aware and O2 left off of pt while sitting in chair.   Stairs: No    ADL: ADL Eating/Feeding: NPO Grooming: Wash/dry hands;Moderate  assistance Where Assessed - Grooming: Unsupported standing Upper Body Bathing: Moderate assistance Where Assessed - Upper Body Bathing: Supported sitting Lower Body Bathing: Maximal assistance Where Assessed - Lower Body Bathing: Supported sit to stand Upper Body Dressing: Maximal assistance Where Assessed - Upper Body Dressing: Supported sitting Lower Body Dressing: +1 Total assistance Where Assessed - Lower Body Dressing: Supported sit to Pharmacist, hospital: +2 Total assistance Toilet Transfer Method: Sit to Barista: Regular height toilet;Grab bars Equipment Used: Rolling walker;Gait belt (RW unsucessful--had to do Bil HHA due to LUE) Transfers/Ambulation Related to ADLs: Min A sit - stand. L bias ADL Comments: demonstrates L bias during ADL. Unable to maintain midline posture when distracted. apparent L inattention  Cognition: Cognition Overall Cognitive Status: Impaired/Different from baseline Orientation Level: Oriented X4 Cognition Arousal/Alertness: Awake/alert Behavior During Therapy: WFL for tasks assessed/performed Overall Cognitive Status: Impaired/Different from baseline Area of Impairment: Attention;Memory;Following commands;Safety/judgement;Awareness;Problem solving Current Attention Level: Sustained Memory: Decreased  short-term memory Following Commands: Follows one step commands consistently Safety/Judgement: Decreased awareness of safety;Decreased awareness of deficits Awareness: Emergent Problem Solving: Slow processing;Difficulty sequencing;Requires verbal cues General Comments: Unaware where her LUE is in space after she places it somewhere (quits visually attending to it).  She also needs verbal cues for midline sitting posture and midline gait with RW.    Blood pressure 144/78, pulse 109, temperature 98 F (36.7 C), temperature source Oral, resp. rate 18, height 5\' 1"  (1.549 m), weight 83.008 kg (183 lb), SpO2 93.00%. Physical Exam   Constitutional: She is oriented to person, place, and time.  HENT:  Head: Normocephalic.  Eyes: EOM are normal.  Neck: Normal range of motion. Neck supple. No thyromegaly present.  Cardiovascular: Normal rate and regular rhythm.   Pulmonary/Chest:  Decreased breath sounds at the bases  Abdominal: Soft. She exhibits distension. There is no tenderness.  Neurological: She is alert and oriented to person, place, and time.  Follows commands. Fairly alert. Occasional delays in processing but generally appropriate. LUE 4/5. LLE 4+. RUE and RLE 4+ to 5/5. No gross sensory impairments. Decreased FMC on right.   Skin: Skin is warm and dry.  Psychiatric: She has a normal mood and affect. Her behavior is normal. Thought content normal.    Results for orders placed during the hospital encounter of 10/13/12 (from the past 24 hour(s))  GLUCOSE, CAPILLARY     Status: Abnormal   Collection Time    10/16/12  7:35 AM      Result Value Range   Glucose-Capillary 120 (*) 70 - 99 mg/dL   Comment 1 Documented in Chart     Comment 2 Notify RN    GLUCOSE, CAPILLARY     Status: Abnormal   Collection Time    10/16/12 11:45 AM      Result Value Range   Glucose-Capillary 146 (*) 70 - 99 mg/dL   Comment 1 Documented in Chart     Comment 2 Notify RN    GLUCOSE, CAPILLARY     Status: Abnormal   Collection Time    10/16/12  3:42 PM      Result Value Range   Glucose-Capillary 143 (*) 70 - 99 mg/dL  GLUCOSE, CAPILLARY     Status: Abnormal   Collection Time    10/16/12  8:13 PM      Result Value Range   Glucose-Capillary 135 (*) 70 - 99 mg/dL   Comment 1 Notify RN     No results found.  Assessment/Plan: Diagnosis: embolic bicerebral infarcts 1. Does the need for close, 24 hr/day medical supervision in concert with the patient's rehab needs make it unreasonable for this patient to be served in a less intensive setting? Potentially 2. Co-Morbidities requiring supervision/potential complications: hx of  cancer requiring chronic peritoneal fluid drainage 3. Due to bladder management, bowel management, safety, skin/wound care, disease management, medication administration, pain management and patient education, does the patient require 24 hr/day rehab nursing? Yes 4. Does the patient require coordinated care of a physician, rehab nurse, PT (1-2 hrs/day, 5 days/week) and OT (1-2 hrs/day, 5 days/week) , SLP 1-2 hours per day/, 5 days per weekto address physical and functional deficits in the context of the above medical diagnosis(es)? Yes Addressing deficits in the following areas: balance, endurance, locomotion, strength, transferring, bowel/bladder control, bathing, dressing, feeding, grooming, toileting and psychosocial support, swallowing, cognition. 5. Can the patient actively participate in an intensive therapy program of at least 3 hrs of therapy per day  at least 5 days per week? Yes 6. The potential for patient to make measurable gains while on inpatient rehab is good 7. Anticipated functional outcomes upon discharge from inpatient rehab are mod I with PT, mod I with OT, mod I with SLP. 8. Estimated rehab length of stay to reach the above functional goals is: 7-10 days 9. Does the patient have adequate social supports to accommodate these discharge functional goals? Potentially 10. Anticipated D/C setting: Home 11. Anticipated post D/C treatments: HH therapy 12. Overall Rehab/Functional Prognosis: good  RECOMMENDATIONS: This patient's condition is appropriate for continued rehabilitative care in the following setting: CIR pending tomorrow's PT eval Patient has agreed to participate in recommended program. Yes Note that insurance prior authorization may be required for reimbursement for recommended care.  Comment: Pt seems to be progressing nicely. If we do bring her to inpatient rehab, she will only require a brief stay. Rehab RN to follow up.   Ranelle Oyster, MD,  [place] [facility]     10/17/2012

## 2012-10-17 NOTE — Progress Notes (Signed)
I will follow up with pt and family after therapy tomorrow to assist in determining most appropriate rehab venue. [employer] will have to approve any rehab venue . [phone]

## 2012-10-17 NOTE — Progress Notes (Addendum)
Subjective: Patient seen this morning and was relatively comfortable except for mild abdominal pain. After suffering a acute right MCA territory CVA, she is stabilizing. She was evaluated by speech therapy today and they recommend nectar thick liquids dysphagia 2 diet. Also evaluated by Dr. Faith Rogue who may take patient to inpatient rehabilitation depending on physical therapy evaluation tomorrow morning. No deterioration in neurological status from acute stroke. Patient does have metastatic adenocarcinoma of either the appendix or: With peritoneal carcinomatosis and malignant ascites but is tolerating weekly therapeutic paracentesis and willing to work hard in physical therapy with an attempt to return home for the next week or so. Dr. Mancel Bale will see her in the a.m. But there is little more to offer in terms of therapy except for comfort therapy for her carcinomatosis. Her anemia has responded well to transfusions and hemoglobin yesterday was 10.1 and we'll repeat hemoglobin and a.m. Continues on CPAP for OSA  Objective: Weight change:   Intake/Output Summary (Last 24 hours) at 10/17/12 1819 Last data filed at 10/17/12 1700  Gross per 24 hour  Intake    360 ml  Output      0 ml  Net    360 ml   Filed Vitals:   10/17/12 0400 10/17/12 0824 10/17/12 1047 10/17/12 1332  BP: 144/78 140/91  119/68  Pulse: 109 125  88  Temp: 98 F (36.7 C) 97.7 F (36.5 C)  97.7 F (36.5 C)  TempSrc: Oral Oral  Oral  Resp: 18 18  18   Height:      Weight:      SpO2: 93% 98% 92% 94%   General: Comfortable in chair, eating, alert, communicative, fully oriented, not short of breath at rest.  HEENT: No jaundice, no conjunctival injection or discharge. Hydration is fair.  NECK: Supple, JVP not seen, no carotid bruits, no palpable lymphadenopathy, no palpable goiter.  CHEST: Clinically clear to auscultation, no wheezes, no crackles.  HEART: Sounds 1 and 2 heard, normal, regular, no murmurs.   ABDOMEN: Moderately obese, soft, distended,moderately tender to palpation, no palpable organomegaly, no palpable masses, normal bowel sounds.  GENITALIA: Not examined.  LOWER EXTREMITIES: No pitting edema, palpable peripheral pulses.  MUSCULOSKELETAL SYSTEM: Generalized osteoarthritic changes, otherwise, normal.  CENTRAL NERVOUS SYSTEM: Patient has mild left facial asymmetry and mild LUE weakness. focal neurologic deficit on gross examination.   Lab Results: Results for orders placed during the hospital encounter of 10/13/12 (from the past 48 hour(s))  GLUCOSE, CAPILLARY     Status: Abnormal   Collection Time    10/15/12  7:51 PM      Result Value Range   Glucose-Capillary 140 (*) 70 - 99 mg/dL  CBC     Status: Abnormal   Collection Time    10/16/12  5:48 AM      Result Value Range   WBC 8.5  4.0 - 10.5 K/uL   RBC 4.36  3.87 - 5.11 MIL/uL   Hemoglobin 10.1 (*) 12.0 - 15.0 g/dL   Comment: POST TRANSFUSION SPECIMEN   HCT 33.7 (*) 36.0 - 46.0 %   MCV 77.3 (*) 78.0 - 100.0 fL   MCH 23.2 (*) 26.0 - 34.0 pg   MCHC 30.0  30.0 - 36.0 g/dL   RDW 28.4 (*) 13.2 - 44.0 %   Platelets 336  150 - 400 K/uL  BASIC METABOLIC PANEL     Status: Abnormal   Collection Time    10/16/12  5:48 AM  Result Value Range   Sodium 142  135 - 145 mEq/L   Potassium 4.4  3.5 - 5.1 mEq/L   Comment: NO VISIBLE HEMOLYSIS   Chloride 105  96 - 112 mEq/L   CO2 30  19 - 32 mEq/L   Glucose, Bld 144 (*) 70 - 99 mg/dL   BUN 11  6 - 23 mg/dL   Creatinine, Ser 4.78  0.50 - 1.10 mg/dL   Calcium 8.5  8.4 - 29.5 mg/dL   GFR calc non Af Amer 78 (*) >90 mL/min   GFR calc Af Amer >90  >90 mL/min   Comment: (NOTE)     The eGFR has been calculated using the CKD EPI equation.     This calculation has not been validated in all clinical situations.     eGFR's persistently <90 mL/min signify possible Chronic Kidney     Disease.  GLUCOSE, CAPILLARY     Status: Abnormal   Collection Time    10/16/12  7:35 AM       Result Value Range   Glucose-Capillary 120 (*) 70 - 99 mg/dL   Comment 1 Documented in Chart     Comment 2 Notify RN    GLUCOSE, CAPILLARY     Status: Abnormal   Collection Time    10/16/12 11:45 AM      Result Value Range   Glucose-Capillary 146 (*) 70 - 99 mg/dL   Comment 1 Documented in Chart     Comment 2 Notify RN    GLUCOSE, CAPILLARY     Status: Abnormal   Collection Time    10/16/12  3:42 PM      Result Value Range   Glucose-Capillary 143 (*) 70 - 99 mg/dL  GLUCOSE, CAPILLARY     Status: Abnormal   Collection Time    10/16/12  8:13 PM      Result Value Range   Glucose-Capillary 135 (*) 70 - 99 mg/dL   Comment 1 Notify RN    GLUCOSE, CAPILLARY     Status: None   Collection Time    10/17/12  7:36 AM      Result Value Range   Glucose-Capillary 94  70 - 99 mg/dL  GLUCOSE, CAPILLARY     Status: Abnormal   Collection Time    10/17/12 11:02 AM      Result Value Range   Glucose-Capillary 150 (*) 70 - 99 mg/dL   Comment 1 Notify RN    GLUCOSE, CAPILLARY     Status: Abnormal   Collection Time    10/17/12  4:26 PM      Result Value Range   Glucose-Capillary 155 (*) 70 - 99 mg/dL   Comment 1 Notify RN      Studies/Results: No results found. Medications: Scheduled Meds: . antiseptic oral rinse  15 mL Mouth Rinse BID  . aspirin  325 mg Oral Daily  . buPROPion  100 mg Oral Daily  . carvedilol  3.125 mg Oral BID WC  . fentaNYL  25 mcg Transdermal Q72H  . polyethylene glycol  17 g Oral Daily  . potassium chloride SA  10 mEq Oral Daily  . venlafaxine XR  75 mg Oral Q breakfast   Continuous Infusions:  PRN Meds:.morphine injection, ondansetron (ZOFRAN) IV, prochlorperazine, RESOURCE THICKENUP CLEAR, traMADol  Assessment/Plan: Active Problems:  1. CVA (cerebral infarction) with dysphagia and left-sided weakness - continue dysphagia 2 NT liquids per speech therapy evaluation today and appreciate possible opportunity for inpatient rehabilitation prior  to transitioning to  home to be with her husband who is chronically ill. Patient is on aspirin and is certainly not an anticoagulation candidate.  2.  Metastatic adenocarcinoma with peritoneal carcinomatosis and ascites - she does have pain and will try fentanyl patch. Tylenol is not working that well. She has failed chemotherapy and is now receiving weekly paracentesis for comfort. Has been on hospice but will transitioned off hospice to inpatient rehabilitation and then back to hospice when discharged. Hope is that she can return home to functionally be with her husband weeks to a month or so.  3.  Anemia - multifactorial and likely secondary to carcinomatosis. Hemoglobin 10.1 after 3 units of blood.  4.  History of hypertension - aware  5.  History of diabetes - off metformin and CBGs are in the mid 100s  6.  Fall with scalp hematoma - stable  7.  Obstructive sleep apnea - continue CPAP   LOS: 4 days   Pearla Dubonnet, MD 10/17/2012, 6:19 PM

## 2012-10-17 NOTE — Progress Notes (Signed)
[facility] MSW : Pt is a current [facility] homecare pt. Pt was alert and oriented on visit. Pt complained that her shoulders were hurting. Pt's minister came to visit to offer spiritual support and prayer. Pt's goal is to rehab to get stronger. Pt desires to go to [facility] located at [facility] as her first choice.If pt does not get a rehab bed there she desires SNF rehab. Pt will discharge [facility] services once she enters rehab for medicare part A to pay therapy. MSW offered support and education. Please call with questions or concerns.  Elijio Miles, MSW

## 2012-10-17 NOTE — Progress Notes (Signed)
Physical Therapy Treatment Patient Details Name: [name] MRN: [id number] DOB: [birth year] Today's Date: 10/17/2012 Time: 2952-8413 PT Time Calculation (min): 39 min  PT Assessment / Plan / Recommendation  History of Present Illness [name] an 79 y.o. female who awakened early morning of 8/29. Was normal at that time. Later this morning when she got up to use the bathroom she had difficulty getting her pants down. She began to feel poorly and then fell. When her aide found her she was on the floor she had a left facial droop and was weak on the left side. EMS was called and the patient was brought in as a code stroke. Initial NIHSS of 8.  Brain MRI revealed scattered small acute posterior right MCA territory infarcts without mass effect, punctate petechial hemorrhage in the right parietal lobe, as well as several tiny acute infarcts also in the posterior left MCA territory and also the right cerebellum.   PT Comments   The pt is progressing well with mobility, still requiring visual and verbal cues for midline in RW and in sitting.  She continues to have DOE (3/4)with gait despite O2 sats in the low 90s.  She got nauseated after short distance gait to the bathroom and vomited her breakfast as well as some thick, mucousy fleam.  I spoke with her daughter over the phone updating her on her mom's progress.  She continues to be an excellent rehab candidate as she is improving daily and seems to have some carryover of education from session to session.    Follow Up Recommendations  CIR     Does the patient have the potential to tolerate intense rehabilitation    Yes  Barriers to Discharge   None      Equipment Recommendations  None recommended by PT    Recommendations for Other Services   None  Frequency Min 4X/week   Progress towards PT Goals Progress towards PT goals: Progressing toward goals  Plan Current plan remains appropriate    Precautions / Restrictions  Precautions Precautions: Fall Precaution Comments: L inattention   Pertinent Vitals/Pain See vitals flow sheet.    Mobility  Bed Mobility Bed Mobility: Supine to Sit;Sitting - Scoot to Edge of Bed Supine to Sit: 4: Min assist;With rails;HOB elevated Sitting - Scoot to [place] of Bed: 4: Min assist;With rail Details for Bed Mobility Assistance: min assist to support trunk to get to sitting EOB.   Transfers Transfers: Sit to Stand;Stand to Sit Sit to Stand: 4: Min assist;3: Mod assist;With armrests;With upper extremity assist;From bed;From chair/3-in-1 Stand to Sit: 4: Min assist;With upper extremity assist;With armrests;To chair/3-in-1 Details for Transfer Assistance: min assist to support trunk to stand from bed, mod assist from lower straight back chair.  Pt needs cues for safe hand placement and safe use of rollator.  Pt needed mod assist for underwear management and total assist for pericare during toileting.  Ambulation/Gait Ambulation/Gait Assistance: 4: Min assist Ambulation Distance (Feet): 30 Feet (15'x2 with 10 min seated rest break due to nausea and vomit) Assistive device: 4-wheeled walker Ambulation/Gait Assistance Details: Pt doing better trying to use visual cue (tape on rollator) to stay in midline.  She is self cueing for posture, but still needs cues for safe use of Rollator especially as she gets closer to sitting or standing surface (chair/toilet/sink).   Gait Pattern: Step-through pattern;Decreased step length - left;Decreased hip/knee flexion - left;Decreased dorsiflexion - left;Shuffle;Trunk flexed;Narrow base of support (pt ) General Gait Details: HR into  the 130s with short distance gait. O2 sats 92% on RA despite DOE 93%.        PT Goals (current goals can now be found in the care plan section) Acute Rehab PT Goals Patient Stated Goal: to go home, family wants her to get a little stronger and more independent before returning home.    Visit Information  Last PT  Received On: 10/17/12 Assistance Needed: +1 History of Present Illness: [name] an 79 y.o. female who awakened early morning of 8/29. Was normal at that time. Later this morning when she got up to use the bathroom she had difficulty getting her pants down. She began to feel poorly and then fell. When her aide found her she was on the floor she had a left facial droop and was weak on the left side. EMS was called and the patient was brought in as a code stroke. Initial NIHSS of 8.  Brain MRI revealed scattered small acute posterior right MCA territory infarcts without mass effect, punctate petechial hemorrhage in the right parietal lobe, as well as several tiny acute infarcts also in the posterior left MCA territory and also the right cerebellum.    Subjective Data  Subjective: Pt alert, reports she slept better last night because her BiPaP was adjusted.   Patient Stated Goal: to go home, family wants her to get a little stronger and more independent before returning home.     Cognition  Cognition Arousal/Alertness: Awake/alert Behavior During Therapy: WFL for tasks assessed/performed Overall Cognitive Status: Impaired/Different from baseline Area of Impairment: Attention;Memory;Following commands;Safety/judgement;Awareness;Problem solving Current Attention Level: Sustained Memory: Decreased short-term memory Following Commands: Follows one step commands consistently Safety/Judgement: Decreased awareness of safety;Decreased awareness of deficits Awareness: Emergent Problem Solving: Slow processing;Difficulty sequencing;Requires verbal cues    Balance  Static Standing Balance Static Standing - Balance Support: Bilateral upper extremity supported Static Standing - Level of Assistance: 4: Min assist  End of Session PT - End of Session Activity Tolerance: Patient limited by fatigue;Other (comment) (limited by nausea) Patient left: in chair;with call bell/phone within reach Nurse  Communication: Mobility status;Other (comment) (N/V)     Lochlin Eppinger B. Dearies Meikle, PT, DPT [phone]   10/17/2012, 11:02 AM

## 2012-10-17 NOTE — Progress Notes (Signed)
Pt sitting up in the chair.  Lunch tray in front of her.  Pt reported having an episode of vomiting earlier this am.  No current nausea.   Breathing labored.  Pt reports dyspnea which she attributes to her needing to be "drained."   Pt pleasant.  She continues to have trouble with swallowing.  Pt coughed both times she took a sip of her drink while Clinical research associate present.  All phlegm reported to be clear.   Reviewed chart.  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  Admitting Dx is CVA.    Writer will confirm with [facility] staff MD whether this is a related or unrelated admission.  Please notify [facility] at [phone] of any Pt movements.  Elijah Birk RN, [facility] RN.  [facility] [phone]

## 2012-10-17 NOTE — Progress Notes (Signed)
Stroke Team Progress Note  SUBJECTIVE Her PT is at the bedside.  Overall she feels her condition is stable. Though she just threw up her breakfast.  OBJECTIVE Most recent Vital Signs: Filed Vitals:   10/16/12 2000 10/17/12 0000 10/17/12 0400 10/17/12 0824  BP: 130/70 138/85 144/78 140/91  Pulse: 115 106 109 125  Temp: 97.9 F (36.6 C) 98.3 F (36.8 C) 98 F (36.7 C) 97.7 F (36.5 C)  TempSrc: Oral Oral Oral Oral  Resp: 20 18 18 18   Height:      Weight:      SpO2: 94% 95% 93% 98%   CBG (last 3)   Recent Labs  10/16/12 1542 10/16/12 2013 10/17/12 0736  GLUCAP 143* 135* 94    IV Fluid Intake:     MEDICATIONS  . antiseptic oral rinse  15 mL Mouth Rinse BID  . aspirin  325 mg Oral Daily  . buPROPion  100 mg Oral Daily  . carvedilol  3.125 mg Oral BID WC  . fentaNYL  25 mcg Transdermal Q72H  . polyethylene glycol  17 g Oral Daily  . potassium chloride SA  10 mEq Oral Daily  . venlafaxine XR  75 mg Oral Q breakfast   PRN:  morphine injection, ondansetron (ZOFRAN) IV, prochlorperazine, traMADol  Diet:  Dysphagia 2 nectar liquids Activity:  Bedrest DVT Prophylaxis:  SCDs   CLINICALLY SIGNIFICANT STUDIES Basic Metabolic Panel:  Recent Labs Lab 10/15/12 0410 10/16/12 0548  NA 141 142  K 3.4* 4.4  CL 106 105  CO2 27 30  GLUCOSE 136* 144*  BUN 10 11  CREATININE 0.67 0.62  CALCIUM 8.2* 8.5   Liver Function Tests:  Recent Labs Lab 10/13/12 0900  AST 13  ALT 9  ALKPHOS 55  BILITOT <0.1*  PROT 6.0  ALBUMIN 2.2*   CBC:  Recent Labs Lab 10/13/12 0900  10/15/12 0410 10/16/12 0548  WBC 8.7  < > 7.2 8.5  NEUTROABS 6.3  --   --   --   HGB 6.8*  < > 8.4* 10.1*  HCT 24.0*  < > 27.8* 33.7*  MCV 72.7*  < > 74.9* 77.3*  PLT 381  < > 328 336  < > = values in this interval not displayed. Coagulation:  Recent Labs Lab 10/13/12 0900  LABPROT 12.3  INR 0.93   Cardiac Enzymes:  Recent Labs Lab 10/13/12 0900  TROPONINI <0.30   Urinalysis:  Recent  Labs Lab 10/13/12 1102  COLORURINE YELLOW  LABSPEC 1.020  PHURINE 5.0  GLUCOSEU NEGATIVE  HGBUR NEGATIVE  BILIRUBINUR NEGATIVE  KETONESUR NEGATIVE  PROTEINUR NEGATIVE  UROBILINOGEN 0.2  NITRITE POSITIVE*  LEUKOCYTESUR NEGATIVE   Lipid Panel    Component Value Date/Time   CHOL 121 10/14/2012 0455   TRIG 116 10/14/2012 0455   HDL 39* 10/14/2012 0455   CHOLHDL 3.1 10/14/2012 0455   VLDL 23 10/14/2012 0455   LDLCALC 59 10/14/2012 0455   HgbA1C  Lab Results  Component Value Date   HGBA1C 6.2* 10/14/2012    Urine Drug Screen:     Component Value Date/Time   LABOPIA NONE DETECTED 10/13/2012 1102   COCAINSCRNUR NONE DETECTED 10/13/2012 1102   LABBENZ NONE DETECTED 10/13/2012 1102   AMPHETMU NONE DETECTED 10/13/2012 1102   THCU NONE DETECTED 10/13/2012 1102   LABBARB NONE DETECTED 10/13/2012 1102    Alcohol Level:  Recent Labs Lab 10/13/12 0900  ETH <11   CT of the brain 10/13/2012   1. Chronic sphenoid sinusitis.  No acute intracranial findings. 2. Periventricular white matter and corona radiata hypodensities favor chronic ischemic microvascular white matter disease. 3. Left parietal scalp hematoma.    Ct Cervical Spine 10/13/2012    No acute fracture or subluxation.  Diffuse osteopenia.  Multilevel degenerative changes as described above.  Small loculated right pleural effusion.   Original Report Authenticated By: Natasha Mead, M.D.   MRI of the brain  10/14/2012   1. 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 No mass effect. Punctate petechial hemorrhage in the right parietal lobe.  2. Several tiny acute infarcts also in the posterior left MCA territory and also the right cerebellum. This raises the possibility of the recent embolic shower, but might instead represent synchronous small vessel ischemia.  3. Small left scalp hematoma.   MRA of the brain  10/14/2012    Negative intracranial MRA. Mild for age intracranial atherosclerosis with no hemodynamically  significant stenosis or major right MCA or circle of Willis branch occlusion identified.     2D Echocardiogram  EF 55-60% with no source of embolus.   Carotid Doppler  No evidence of hemodynamically significant internal carotid artery stenosis. Vertebral artery flow is antegrade.   CXR  10/13/2012   Mild emphysematous changes.  No pulmonary edema.  There is hazy right base medially atelectasis or infiltrate.  Extensive degenerative changes bilateral shoulders.     EKG  sinus tachycardia.   Therapy Recommendations CIR  Physical Exam  Mental Status:  Alert, oriented, pleasant, appropriate speech. Speech fluent, mild dysarthria. Able to follow 3 step commands without difficulty.  Cranial Nerves:  PERRL, blinks to threat bilat, EOMI, decreased LT on L face, L facial droop, decreased hearing bilat(told is chronic), tongue midline  Motor:  Right : Upper extremity 5/5 Left: Upper extremity 5-/5  Lower extremity 5/5 Lower extremity 4+/5; external rotation noted  Tone and bulk:normal tone throughout; no atrophy noted  Sensory: Pinprick and light touch intact decreased on the left  Deep Tendon Reflexes: 2+ and symmetric with absent AJ's bilaterally  Plantars:  Right: downgoing Left: upgoing  Cerebellar:  normal finger-to-nose  Gait: not tested   ASSESSMENT [name] is a 79 y.o. female presenting with weakness and left facial droop. Imaging confirms scattered small acute infarcts in R MCA, L MCA and R posterior circulation territories. Infarcts felt to be embolic, could be secondary to hypercoagulable state from metastatic cancer vs other cardioembolic source. Given age with metastatic cancer on Hopsice, will not pursue further aggressive embolic workup. On aspirin 81 mg orally every day prior to admission. Now on aspirin 325 mg orally every day for secondary stroke prevention. Work up completed.   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  Agree with plans for CIR No further stroke workup indicated. Patient has a 10-15% risk of having another stroke over the next year, the highest risk is within 2 weeks of the most recent stroke/TIA (risk of having a stroke following a stroke or TIA is the same). Ongoing risk factor control by Primary Care Physician Stroke Service will sign off. Please call should any needs arise. Follow up if needed with Dr. Pearlean Brownie, Stroke Clinic, in 2 months.  SHARON BIBY, MSN, RN, ANVP-BC, ANP-BC, GNP-BC [facility]  Pager: [phone] 10/17/2012 10:23 AM  I have personally obtained a history, examined the patient, evaluated imaging results, and formulated the assessment and plan of care. I agree with the above. Delia Heady, MD

## 2012-10-17 NOTE — Progress Notes (Signed)
Speech Language Pathology Dysphagia Treatment Patient Details Name: [name] MRN: [id number] DOB: 07/08/1922 Today's Date: 10/17/2012 Time: 5621-3086 SLP Time Calculation (min): 9 min  Assessment / Plan / Recommendation Clinical Impression  Pt. seen for dysphagia treatment.  Observed with thin tea followed by intermittent wet vocal quality, delayed throat clear and delayed cough all s/s possible penetration/aspiration.  Chin tuck posture attempted resulting in immediate cough due to likely penetration/aspiration.  Did not observe pt. with Dys 2 solids, however she states she is not having difficulty masticating meals.  Recommend liquid downgrade to nectar (pt. in agreement) and continue Dys 2 given multiple observations of likely inadequate airway protection.  Will continue to follow.      Diet Recommendation  Initiate / Change Diet: Dysphagia 2 (fine chop);Nectar-thick liquid    SLP Plan Continue with current plan of care   Pertinent Vitals/Pain none   Swallowing Goals  SLP Swallowing Goals Patient will consume recommended diet without observed clinical signs of aspiration with: Minimal assistance Swallow Study Goal #1 - Progress: Progressing toward goal Patient will utilize recommended strategies during swallow to increase swallowing safety with: Minimal assistance Swallow Study Goal #2 - Progress: Progressing toward goal  General Temperature Spikes Noted: No Respiratory Status: Room air Behavior/Cognition: Alert;Cooperative;Pleasant mood;Requires cueing Oral Cavity - Dentition: Adequate natural dentition Patient Positioning: Upright in chair  Oral Cavity - Oral Hygiene Does patient have any of the following "at risk" factors?: Other - dysphagia Brush patient's teeth BID with toothbrush (using toothpaste with fluoride): Yes Patient is AT RISK - Oral Care Protocol followed (see row info): Yes   Dysphagia Treatment Treatment focused on: Skilled observation of diet  tolerance;Utilization of compensatory strategies Treatment Methods/Modalities: Skilled observation Patient observed directly with PO's: Yes Type of PO's observed: Thin liquids Feeding: Able to feed self;Needs assist Liquids provided via: Cup;No straw Oral Phase Signs & Symptoms:  (labial residue min) Pharyngeal Phase Signs & Symptoms: Wet vocal quality;Delayed cough;Delayed throat clear Type of cueing: Verbal Amount of cueing: Minimal   GO     Breck Coons Yanni Ruberg M.Ed [employer] [phone]  10/17/2012

## 2012-10-17 NOTE — Evaluation (Signed)
Speech Language Pathology Evaluation Patient Details Name: [name] MRN: [id number] DOB: 10/20/1922 Today's Date: 10/17/2012 Time: 7829-5621 SLP Time Calculation (min): 8 min  Problem List:  Patient Active Problem List   Diagnosis Date Noted  . CVA (cerebral infarction) 10/13/2012  . 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 DVT 02/22/2007  . BRONCHITIS 02/22/2007  . SLEEP APNEA 02/22/2007  . DYSPNEA 02/22/2007   Past Medical History:  Past Medical History  Diagnosis Date  . Cataract   . Diabetes mellitus   . Cancer    Past Surgical History:  Past Surgical History  Procedure Laterality Date  . Bladder surgery  06/1989  . Knee surgery  10/30/1993    Lt   . Back surgery  20/11/1998    ruptured disc  . Colon surgery    . Hernia repair     HPI:  [name] is a 79 y.o. female with MPH of Metastatic adenocarcinoma -carcinomatosis Dx- 2012 (was previous on chemo-xeloda, pantimumamab, but stopped due to progression of cancer/hospice), malignant ascites on weekly paracentesis, DM, HTN, OSA, depression, chronic cancer related pain who woke up  early this morning with pain. Later she got up to use the bathroom she had some difficulty then fell. When her aide found her she was on the floor she had a left facial droop and was weak on the left side. EMS was called and the patient was brought in as a code stroke. Initial NIHSS of 8.  MRI revealed right infarct and left posterior MCA and cerebellar CVA.   Assessment / Plan / Recommendation Clinical Impression  Speech-language-cognitive assessment completed.  She exhibits mild cognitive-linguistice deficits with verbal expression of thoughts and ideas specifically intermittent dysnomia versus decreased thought organization  Pt. is very verbose/tangential and with mild difficulty turn taking in conversation (may be premorbid, no family present).  Verbal problem solving and awareness for basic info was functional.  Sustained attention was fair;  suspect impairments in higher level attention abilities.  All levels of memory will be further evaluated in diagnostic therapy.     SLP Assessment  Patient needs continued Speech Lanaguage Pathology Services    Follow Up Recommendations  Inpatient Rehab    Frequency and Duration min 2x/week  2 weeks   Pertinent Vitals/Pain none   SLP Goals  SLP Goals Potential to Achieve Goals: Good SLP Goal #1: Pt. will demonstrate appropriate selective attention during 15 minute session with min visual prompts. SLP Goal #2: Pt. will use compensatory strategies to organize and express thoughts with min verbal cues.  SLP Evaluation Prior Functioning  Cognitive/Linguistic Baseline: Information not available Type of Home: House  Lives With: Family Available Help at Discharge: Personal care attendant;Available 24 hours/day Vocation: Retired   IT consultant  Overall Cognitive Status: Impaired/Different from baseline Arousal/Alertness: Awake/alert Orientation Level: Oriented X4 Attention: Sustained Sustained Attention: Impaired Sustained Attention Impairment: Verbal basic Memory: Impaired Memory Impairment: Prospective memory Awareness: Appears intact Problem Solving: Appears intact Executive Function:  (deficits in organizing) Safety/Judgment:  (TBA)    Comprehension  Auditory Comprehension Overall Auditory Comprehension: Appears within functional limits for tasks assessed Interfering Components: Attention;Processing speed EffectiveTechniques: Extra processing time Visual Recognition/Discrimination Discrimination: Not tested Reading Comprehension Reading Status: Not tested    Expression Expression Primary Mode of Expression: Verbal Verbal Expression Overall Verbal Expression: Impaired Initiation: No impairment Level of Generative/Spontaneous Verbalization: Conversation Repetition: No impairment Pragmatics: Impairment Impairments: Topic maintenance;Turn Taking Interfering  Components: Attention Written Expression Dominant Hand: Right Written Expression: Not tested  Oral / Motor Oral Motor/Sensory Function Overall Oral Motor/Sensory Function: Impaired Labial ROM: Reduced left Labial Symmetry: Abnormal symmetry left Labial Strength: Reduced Labial Sensation: Reduced Lingual ROM: Reduced left Lingual Symmetry: Abnormal symmetry left Lingual Strength: Reduced Lingual Sensation: Reduced Facial ROM: Reduced left Facial Symmetry: Left droop Facial Strength: Reduced Facial Sensation: Reduced Velum: Within Functional Limits Mandible: Within Functional Limits Motor Speech Overall Motor Speech: Appears within functional limits for tasks assessed Respiration: Within functional limits Phonation: Normal Resonance: Within functional limits Articulation: Within functional limitis Intelligibility: Intelligible Motor Planning: Witnin functional limits   GO     Breck Coons [employer].Ed [employer] [phone]  10/17/2012

## 2012-10-18 ENCOUNTER — Inpatient Hospital Stay (HOSPITAL_COMMUNITY)
Admission: RE | Admit: 2012-10-18 | Discharge: 2012-10-27 | DRG: 945 | Disposition: A | Discharge status: Other Acute Inpatient Hospital | Source: Intra-hospital | Attending: Physical Medicine & Rehabilitation | Admitting: Physical Medicine & Rehabilitation

## 2012-10-18 DIAGNOSIS — F329 Major depressive disorder, single episode, unspecified: Secondary | ICD-10-CM | POA: Diagnosis present

## 2012-10-18 DIAGNOSIS — I6340 Cerebral infarction due to embolism of unspecified cerebral artery: Secondary | ICD-10-CM | POA: Diagnosis present

## 2012-10-18 DIAGNOSIS — G4733 Obstructive sleep apnea (adult) (pediatric): Secondary | ICD-10-CM | POA: Diagnosis present

## 2012-10-18 DIAGNOSIS — R180 Malignant ascites: Secondary | ICD-10-CM | POA: Diagnosis present

## 2012-10-18 DIAGNOSIS — R4789 Other speech disturbances: Secondary | ICD-10-CM | POA: Diagnosis present

## 2012-10-18 DIAGNOSIS — G4700 Insomnia, unspecified: Secondary | ICD-10-CM | POA: Diagnosis present

## 2012-10-18 DIAGNOSIS — E1149 Type 2 diabetes mellitus with other diabetic neurological complication: Secondary | ICD-10-CM | POA: Diagnosis present

## 2012-10-18 DIAGNOSIS — C181 Malignant neoplasm of appendix: Secondary | ICD-10-CM | POA: Diagnosis present

## 2012-10-18 DIAGNOSIS — F3289 Other specified depressive episodes: Secondary | ICD-10-CM | POA: Diagnosis present

## 2012-10-18 DIAGNOSIS — Z5189 Encounter for other specified aftercare: Principal | ICD-10-CM

## 2012-10-18 DIAGNOSIS — R1310 Dysphagia, unspecified: Secondary | ICD-10-CM | POA: Diagnosis present

## 2012-10-18 DIAGNOSIS — Z515 Encounter for palliative care: Secondary | ICD-10-CM

## 2012-10-18 DIAGNOSIS — D649 Anemia, unspecified: Secondary | ICD-10-CM | POA: Diagnosis present

## 2012-10-18 DIAGNOSIS — K56609 Unspecified intestinal obstruction, unspecified as to partial versus complete obstruction: Secondary | ICD-10-CM | POA: Diagnosis not present

## 2012-10-18 DIAGNOSIS — E1142 Type 2 diabetes mellitus with diabetic polyneuropathy: Secondary | ICD-10-CM | POA: Diagnosis present

## 2012-10-18 DIAGNOSIS — I10 Essential (primary) hypertension: Secondary | ICD-10-CM | POA: Diagnosis present

## 2012-10-18 DIAGNOSIS — F411 Generalized anxiety disorder: Secondary | ICD-10-CM | POA: Diagnosis present

## 2012-10-18 DIAGNOSIS — Z66 Do not resuscitate: Secondary | ICD-10-CM | POA: Diagnosis present

## 2012-10-18 DIAGNOSIS — I6330 Cerebral infarction due to thrombosis of unspecified cerebral artery: Secondary | ICD-10-CM

## 2012-10-18 DIAGNOSIS — E8809 Other disorders of plasma-protein metabolism, not elsewhere classified: Secondary | ICD-10-CM | POA: Diagnosis present

## 2012-10-18 LAB — GLUCOSE, CAPILLARY
Glucose-Capillary: 118 mg/dL — ABNORMAL HIGH (ref 70–99)
Glucose-Capillary: 134 mg/dL — ABNORMAL HIGH (ref 70–99)

## 2012-10-18 MED ORDER — LIDOCAINE 5 % EX PTCH
1.0000 | MEDICATED_PATCH | CUTANEOUS | Status: DC
  Administered 2012-10-18: 1 via TRANSDERMAL
  Filled 2012-10-18 (×2): qty 1

## 2012-10-18 MED ORDER — POLYETHYLENE GLYCOL 3350 17 G PO PACK
17.0000 g | PACK | Freq: Every day | ORAL | Status: DC
  Administered 2012-10-19 – 2012-10-23 (×5): 17 g via ORAL
  Filled 2012-10-18 (×10): qty 1

## 2012-10-18 MED ORDER — FENTANYL 25 MCG/HR TD PT72
25.0000 ug | MEDICATED_PATCH | TRANSDERMAL | Status: DC
  Administered 2012-10-20: 25 ug via TRANSDERMAL
  Filled 2012-10-18: qty 1

## 2012-10-18 MED ORDER — TRAMADOL HCL 50 MG PO TABS
100.0000 mg | ORAL_TABLET | Freq: Four times a day (QID) | ORAL | Status: DC | PRN
  Administered 2012-10-20: 100 mg via ORAL
  Filled 2012-10-18: qty 2

## 2012-10-18 MED ORDER — VENLAFAXINE HCL ER 75 MG PO CP24
75.0000 mg | ORAL_CAPSULE | Freq: Every day | ORAL | Status: DC
Start: 2012-10-19 — End: 2012-10-27
  Administered 2012-10-19 – 2012-10-23 (×5): 75 mg via ORAL
  Filled 2012-10-18 (×10): qty 1

## 2012-10-18 MED ORDER — ONDANSETRON HCL 4 MG PO TABS
4.0000 mg | ORAL_TABLET | Freq: Three times a day (TID) | ORAL | Status: DC | PRN
  Administered 2012-10-19: 4 mg via ORAL
  Filled 2012-10-18: qty 1

## 2012-10-18 MED ORDER — ACETAMINOPHEN 325 MG PO TABS
325.0000 mg | ORAL_TABLET | ORAL | Status: DC | PRN
  Administered 2012-10-19 – 2012-10-20 (×2): 650 mg via ORAL
  Filled 2012-10-18 (×3): qty 2

## 2012-10-18 MED ORDER — RESOURCE THICKENUP CLEAR PO POWD
ORAL | Status: DC | PRN
  Filled 2012-10-18: qty 125

## 2012-10-18 MED ORDER — ONDANSETRON HCL 4 MG PO TABS: 4.0000 mg | ORAL_TABLET | Freq: Three times a day (TID) | ORAL | Status: AC | PRN

## 2012-10-18 MED ORDER — LIDOCAINE 5 % EX PTCH
1.0000 | MEDICATED_PATCH | CUTANEOUS | Status: DC
  Administered 2012-10-19 – 2012-10-26 (×8): 1 via TRANSDERMAL
  Filled 2012-10-18 (×10): qty 1

## 2012-10-18 MED ORDER — SORBITOL 70 % SOLN
30.0000 mL | Freq: Every day | Status: DC | PRN
  Administered 2012-10-20: 15 mL via ORAL
  Administered 2012-10-22: 30 mL via ORAL
  Filled 2012-10-18 (×3): qty 30

## 2012-10-18 MED ORDER — CARVEDILOL 3.125 MG PO TABS
3.1250 mg | ORAL_TABLET | Freq: Two times a day (BID) | ORAL | Status: DC
  Administered 2012-10-19 – 2012-10-23 (×8): 3.125 mg via ORAL
  Filled 2012-10-18 (×19): qty 1

## 2012-10-18 MED ORDER — POTASSIUM CHLORIDE CRYS ER 10 MEQ PO TBCR
10.0000 meq | EXTENDED_RELEASE_TABLET | Freq: Every day | ORAL | Status: DC
  Administered 2012-10-19 – 2012-10-23 (×5): 10 meq via ORAL
  Filled 2012-10-18 (×8): qty 1

## 2012-10-18 MED ORDER — ONDANSETRON HCL 4 MG PO TABS: 4.0000 mg | ORAL_TABLET | Freq: Four times a day (QID) | ORAL | Status: DC | PRN

## 2012-10-18 MED ORDER — LIDOCAINE 5 % EX PTCH: 1.0000 | MEDICATED_PATCH | CUTANEOUS | Status: AC

## 2012-10-18 MED ORDER — FENTANYL 25 MCG/HR TD PT72: 1.0000 | MEDICATED_PATCH | TRANSDERMAL | Status: DC

## 2012-10-18 MED ORDER — ASPIRIN 325 MG PO TABS
325.0000 mg | ORAL_TABLET | Freq: Every day | ORAL | Status: DC
  Administered 2012-10-19 – 2012-10-23 (×5): 325 mg via ORAL
  Filled 2012-10-18 (×10): qty 1

## 2012-10-18 MED ORDER — BUPROPION HCL ER (SR) 100 MG PO TB12
100.0000 mg | ORAL_TABLET | Freq: Every day | ORAL | Status: DC
  Administered 2012-10-19 – 2012-10-23 (×5): 100 mg via ORAL
  Filled 2012-10-18 (×9): qty 1

## 2012-10-18 MED ORDER — BIOTENE DRY MOUTH MT LIQD
15.0000 mL | Freq: Two times a day (BID) | OROMUCOSAL | Status: DC
  Administered 2012-10-18 – 2012-10-26 (×16): 15 mL via OROMUCOSAL

## 2012-10-18 MED ORDER — ONDANSETRON HCL 4 MG PO TABS
4.0000 mg | ORAL_TABLET | Freq: Three times a day (TID) | ORAL | Status: DC | PRN
  Administered 2012-10-18: 4 mg via ORAL
  Filled 2012-10-18: qty 1

## 2012-10-18 NOTE — Progress Notes (Signed)
Speech Language Pathology Dysphagia and Cognitive-linguisticTreatment  Patient Details Name: [name] MRN: [id number] DOB: 1922/04/23 Today's Date: 10/18/2012 Time: 1478-2956 SLP Time Calculation (min): 26 min  Assessment / Plan / Recommendation Clinical Impression  Follow up from yesterday for dysphagia and cognitive-linguistic treatment with daughter Elzie Rings present.  SLP reviewed assessments, recommendations and clinical reasoning with pt. and daughter.  Observed with nectar thick juice without s/s aspiration today and only requiring supervision for safe consumption.  Consumed pills whole in pudding with RN without apparent difficulty.  Disussed diet textures with pt. and SLP/pt. agreed to stay on Dys 2 presently due to sub endurance from baseline.  Pt. exhibited several instances of dysnomia during conversation and pt. stated "see, I have trouble with that."  Introduced strategies she can implement to fluently express thoughts such as using description, synonyms/antonyms and pt. independently stated she uses the alphabet strategy.  Pt. would benefit from continued ST at inpatient rehab for dysphagia and cognitive-linguistic treatment.     Diet Recommendation  Continue with Current Diet: Dysphagia 2 (fine chop);Nectar-thick liquid    SLP Plan Continue with current plan of care   Pertinent Vitals/Pain none   Swallowing Goals  SLP Swallowing Goals Patient will consume recommended diet without observed clinical signs of aspiration with: Minimal assistance Swallow Study Goal #1 - Progress: Progressing toward goal Patient will utilize recommended strategies during swallow to increase swallowing safety with: Minimal assistance Swallow Study Goal #2 - Progress: Progressing toward goal  Pt. will demonstrate appropriate selective attention during 15 minute session with min visual prompts.  Progressing toward goal SLP Goal #2: Pt. will use compensatory strategies to organize and express  thoughts with min verbal cues. Progressing toward goal   General Temperature Spikes Noted: No Respiratory Status: Room air Behavior/Cognition: Alert;Cooperative;Pleasant mood;Requires cueing Oral Cavity - Dentition: Adequate natural dentition Patient Positioning: Upright in chair  Oral Cavity - Oral Hygiene Does patient have any of the following "at risk" factors?: Diet - patient on thickened liquids;Other - dysphagia Brush patient's teeth BID with toothbrush (using toothpaste with fluoride): Yes Patient is HIGH RISK - Oral Care Protocol followed (see row info): Yes   Dysphagia Treatment Treatment focused on: Skilled observation of diet tolerance;Utilization of compensatory strategies Family/Caregiver Educated: daughter Treatment Methods/Modalities: Skilled observation Patient observed directly with PO's: Yes Type of PO's observed: Nectar-thick liquids;Dysphagia 1 (puree) Feeding: Able to feed self Liquids provided via: Cup;No straw Pharyngeal Phase Signs & Symptoms:  (none) Amount of cueing:  (supervision)        Royce Macadamia M.Ed [employer] [phone]  10/18/2012

## 2012-10-18 NOTE — Discharge Summary (Signed)
Physician Discharge Summary  NAME:[name]  ZOX:[id number]  DOB: 06-Jul-1922   Admit date: 10/13/2012 Discharge date: 10/18/2012  Discharge Diagnoses:  Active Problems:  1. CVA (cerebral infarction) with dysphagia and left-sided weakness - continue dysphagia 2 NT liquids per speech therapy evaluation  and appreciate  opportunity for inpatient rehabilitation prior to transitioning to home to be with her husband who is chronically ill. Patient is on aspirin and is certainly not an anticoagulation candidate.  2. 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  Tramadol is not working that well. She has failed chemotherapy and is now receiving weekly paracentesis for comfort. Has been on hospice but will transitioned off hospice to inpatient rehabilitation and then back to hospice when discharged. Hope is that she can return home to functionally be with her husband weeks to a month or so.  3. Anemia - multifactorial and likely secondary to carcinomatosis. Hemoglobin 10.1 after 3 units of blood.  4. History of hypertension - aware  5. History of diabetes - off metformin and CBGs are in the mid 100s  6. Fall with scalp hematoma - stable  7. Obstructive sleep apnea - continue CPAP  Discharge Physical Exam:  General Appearance: Alert, cooperative, no distress, appears stated age  Weight change:   Intake/Output Summary (Last 24 hours) at 10/18/12 0804 Last data filed at 10/17/12 1700  Gross per 24 hour  Intake    360 ml  Output      0 ml  Net    360 ml   Filed Vitals:   10/17/12 1332 10/17/12 2000 10/17/12 2351 10/18/12 0351  BP: 119/68 155/126 117/78 129/83  Pulse: 88 130 120 107  Temp: 97.7 F (36.5 C) 97.9 F (36.6 C) 97.5 F (36.4 C) 97.7 F (36.5 C)  TempSrc: Oral   Axillary  Resp: 18 18 18 18   Height:      Weight:      SpO2: 94% 95% 95% 95%   General: Comfortable in  chair, eating, alert, communicative, fully oriented, not short of breath at rest.  HEENT: No jaundice, no conjunctival injection or discharge. Hydration is fair.  NECK: Supple, JVP not seen, no carotid bruits, no palpable lymphadenopathy, no palpable goiter.  CHEST: Clinically clear to auscultation, no wheezes, no crackles.  HEART: Sounds 1 and 2 heard, normal, regular, no murmurs.  ABDOMEN: Moderately obese, soft, distended, minimally tender to palpation, no palpable organomegaly, no palpable masses, normal bowel sounds.  GENITALIA: Not examined.  LOWER EXTREMITIES: No pitting edema, palpable peripheral pulses.  MUSCULOSKELETAL SYSTEM: Generalized osteoarthritic changes, otherwise, normal.  Right shoulder pain episodically but no Erythema or swelling or deformity CENTRAL NERVOUS SYSTEM: Patient has mild left facial asymmetry and mild LUE weakness.  Discharge Condition: Improved but still with gait abnormality secondary to stroke.  Abdominal pain has improved.  Hospital Course: [name] is a very pleasant 79 year old right-handed female who unfortunately has metastatic adenocarcinoma was diagnosed in 2012.  Felt to be aware of appendiceal or colonic R. 10, she now has diffuse carcinomatosis involving the peritoneum with malignant ascites but has to be drained once weekly.  She has had prior chemotherapy but has stopped responding to that and is more in comfort mode currently.  She has been followed by hospice but she is responding well after suffering a right MCA territory CVA and can likely transition home to be with her husband for the next month  or so with continued peritoneal taps once a week. Plans are for inpatient rehabilitation at this point and to transitioning home in the next couple of weeks to do as well as she can thereafter until her disease expectantly Will worsened but in the meantime can be of some assistance to her husband who is chronically ill. While hospitalized she was  transfused 3 units and hemoglobin is much improved.  She has required a dysphagia 2 nitroglycerin thick liquid diet. Oncologically, she is followed by Dr. Mancel Bale  Consults: Treatment Team:  Ladene Artist, MD  Disposition: Inpatient rehabilitation   Future Appointments Provider Department Dept Phone   10/20/2012 11:00 AM [facility] [phone]   10/17/2012 9:45 AM Ladene Artist, MD [facility] ONCOL[facility] [phone]   11/04/2012 11:00 AM [facility] [phone]       Medication List    ASK your doctor about these medications       aspirin 81 MG tablet  Take 81 mg by mouth daily.     buPROPion 100 MG 12 hr tablet  Commonly known as:  WELLBUTRIN SR  Take 100 mg by mouth daily.     carvedilol 3.125 MG tablet  Commonly known as:  COREG  Take 3.125 mg by mouth 2 (two) times daily with a meal.     furosemide 20 MG tablet  Commonly known as:  LASIX  Take 40 mg by mouth daily.     KLOR-CON M20 20 MEQ tablet  Generic drug:  potassium chloride SA  Take 10 mEq by mouth daily.     lisinopril 5 MG tablet  Commonly known as:  PRINIVIL,ZESTRIL  Take 5 mg by mouth daily.     meloxicam 15 MG tablet  Commonly known as:  MOBIC  Take 15 mg by mouth daily.     metFORMIN 500 MG tablet  Commonly known as:  GLUCOPHAGE  Take 500 mg by mouth 2 (two) times daily.     ondansetron 8 MG tablet  Commonly known as:  ZOFRAN  Take 1 tablet (8 mg total) by mouth every 12 (twelve) hours as needed for nausea.     polyethylene glycol packet  Commonly known as:  MIRALAX / GLYCOLAX  Take 17 g by mouth daily.     prochlorperazine 5 MG tablet  Commonly known as:  COMPAZINE  Take 1 tablet (5 mg total) by mouth every 6 (six) hours as needed for nausea.     senna 8.6 MG tablet  Commonly known as:  SENOKOT  Take 1 tablet by mouth 2 (two) times daily.     traMADol 50 MG tablet  Commonly known  as:  ULTRAM  Take 100 mg by mouth every 6 (six) hours as needed for pain.     venlafaxine 75 MG tablet  Commonly known as:  EFFEXOR  Take 75 mg by mouth daily.           Follow-up Information   Follow up with Ronnett Pullin Rigg, MD. Schedule an appointment as soon as possible for a visit in 2 months. (stroke clinic)    Specialties:  Neurology, Radiology   Contact information:   [address] [place] [address] [phone]       The results of significant diagnostics from this hospitalization (including imaging, microbiology, ancillary and laboratory) are listed below for reference.    Significant Diagnostic Studies: Dg Shoulder Right  10/13/2012   *RADIOLOGY REPORT*  Clinical Data: Right shoulder pain after fall.  RIGHT SHOULDER - 2+ VIEW  Comparison: None.  Findings: No fracture or dislocation is noted.  Visualized ribs appear normal.  Mild degenerative changes seen involving the right acromioclavicular joint.  There is severe degenerative change involving the right glenohumeral joint with deformity of right humeral head as a result.  Severe narrowing of the right acromiohumeral space is noted consistent with rotator cuff injury.  IMPRESSION: Severe degenerative joint disease of right glenohumeral joint is noted.  No acute fracture or dislocation is noted.  Severe narrowing of right acromiohumeral space is noted consistent with rotator cuff injury.   Original Report Authenticated By: Lupita Raider.,  M.D.   Ct Head Wo Contrast  10/13/2012   CLINICAL DATA:  Unwitnessed fall. Left-sided weakness with slurred speech and left facial droop. Code stroke.  EXAM: CT HEAD WITHOUT CONTRAST  TECHNIQUE: Contiguous axial images were obtained from the base of the skull through the vertex without intravenous contrast.  COMPARISON:  None.  FINDINGS: The brainstem, cerebellum, cerebral peduncles, thalamus, basal ganglia, basilar cisterns, and ventricular system appear within normal  limits. Periventricular white matter and corona radiata hypodensities favor chronic ischemic microvascular white matter disease. No intracranial hemorrhage, mass lesion, or acute CVA. Left parietal scalp hematoma observed. There is chronic sphenoid sinusitis.  IMPRESSION: 1. Chronic sphenoid sinusitis. No acute intracranial findings. 2. Periventricular white matter and corona radiata hypodensities favor chronic ischemic microvascular white matter disease. 3. Left parietal scalp hematoma.  These results were called by telephone at the time of interpretation on 10/13/2012 at 9:08 AM to Dr. Thad Ranger, who verbally acknowledged these results.   Electronically Signed   By: Herbie Baltimore   On: 10/13/2012 09:13   Ct Cervical Spine Wo Contrast  10/13/2012   *RADIOLOGY REPORT*  Clinical Data: Fall  CT CERVICAL SPINE WITHOUT CONTRAST  Technique:  Multidetector CT imaging of the cervical spine was performed. Multiplanar CT image reconstructions were also generated.  Comparison: 05/01/2007  Findings:  Axial images shows no acute fracture or subluxation. There is diffuse osteopenia.  There is no pneumothorax in visualized lung apices.  Loculated small right pleural effusion noted posteriorly.  Atherosclerotic calcifications of the thoracic aorta.  Computer processed images shows no acute fracture or subluxation. Degenerative changes are noted C1-C2 articulation.  Mild disc space flattening at C3-C4 level.  Mild to moderate disc space flattening with anterior spurring and mild posterior spurring at C4-C5 level. Significant disc space flattening with anterior spurring and vacuum disc phenomenon at C5-C6 level.  Significant disc space flattening with anterior spurring and partial bony fusion at C6-C7 level. Partial bony fusion noted at C 7 T1 vertebral body.  Disc space flattening with anterior spurring noted at T1 T2 level.  Mild spinal canal stenosis due to posterior spurring at C5-C6 level.  No prevertebral soft tissue  swelling.  Cervical airway is patent.  IMPRESSION: No acute fracture or subluxation.  Diffuse osteopenia.  Multilevel degenerative changes as described above.  Small loculated right pleural effusion.   Original Report Authenticated By: Natasha Mead, M.D.   Mr Brain Wo Contrast  10/14/2012   CLINICAL DATA:  79 year old female with fall, subsequently discovered to have left facial droop and left-sided weakness. Code stroke. History of metastatic cancer.  EXAM: MRI HEAD WITHOUT CONTRAST  MRA HEAD WITHOUT CONTRAST  TECHNIQUE: Multiplanar, multiecho pulse sequences of the brain and surrounding structures were obtained without intravenous contrast. Angiographic images of the head were obtained using MRA technique without  contrast.  COMPARISON:  Head and cervical spine CT 10/13/2012.  FINDINGS: MRI HEAD FINDINGS  Gyriform restricted diffusion at the posterior right insula and posterior temporal operculum. Confluent wedge-shaped area of restricted diffusion in the poster right temporal lobe a or parietal lobe involving a 2 cm area. Scattered additional posterior right MCA territory small cortically based infarcts.  There is also evidence of several punctate or lacunar acute infarcts not in the right MCA territory: Left parietal lobe on series 8, image 32, left inferior parietal lobe on image 27, and right posterior cerebellar hemisphere on image 10. Heterogeneous diffusion in the brainstem but no definite acute brainstem infarct.  Mild associated T2 and FLAIR hyperintensity in these areas. No associated mass effect. Punctate petechial hemorrhage in the right parietal lobe (series 9, image 16. Otherwise no intracranial hemorrhage.  Major intracranial vascular flow voids are preserved although there is asymmetric increased signal in some right posterior MCA branches on FLAIR imaging. See MRA findings below.  No midline shift, mass effect, or evidence of intracranial mass lesion. Ventricular size and configuration are within  normal limits. Negative pituitary, cervicomedullary junction and visualized cervical spine. Increased fluid or mucosal thickening in the sphenoid sinus. Left posterior convexity scalp hematoma. No acute orbits soft tissue findings. Mastoids are clear.  MRA HEAD FINDINGS  Study is mildly degraded by motion artifact despite repeated imaging attempts.  Antegrade flow in the posterior circulation. Mildly dominant distal right vertebral artery. Distal vertebral artery irregularity compatible with atherosclerosis but no stenosis. Patent vertebrobasilar junction. Dominant AICA vessels. No basilar stenosis. SCA origins and left PCA origin are normal. Fetal type right PCA origin. Bilateral PCA branches are within normal limits.  Antegrade flow in both ICA siphons. ICA irregularity compatible with atherosclerosis but no subsequent stenosis. Ophthalmic and right posterior communicating artery origins are within normal limits. Left posterior communicating artery is diminutive or absent. Carotid termini, MCA and ACA origins are within normal limits. Mild motion artifact at the level of the anterior communicating artery. Visualized ACA branches are within normal limits. Left MCA M1 segment and visualized left MCA branches are within normal limits.  Right MCA M1 segment is within normal limits. Right MCA bifurcation is patent. No right MCA major branch occlusion is identified.  IMPRESSION: MRI HEAD IMPRESSION  1. 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 No mass effect. Punctate petechial hemorrhage in the right parietal lobe.  2. Several tiny acute infarcts also in the posterior left MCA territory and also the right cerebellum. This raises the possibility of the recent embolic shower, but might instead represent synchronous small vessel ischemia.  3. Small left scalp hematoma.  MRA HEAD IMPRESSION  Negative intracranial MRA. Mild for age intracranial atherosclerosis with no hemodynamically  significant stenosis or major right MCA or circle of Willis branch occlusion identified.   Electronically Signed   By: Augusto Gamble   On: 10/14/2012 11:44   Dg Pelvis Portable  10/13/2012   *RADIOLOGY REPORT*  Clinical Data: Larey Seat.  Right hip pain.  PORTABLE PELVIS  Comparison: 07/14/2012.  Findings: Both hips are normally located.  Advanced degenerative joint disease bilaterally.  No definite acute hip fracture.  The pubic symphysis demonstrates moderate degenerative changes.  No definite pubic rami fractures.  The SI joints are intact.  Mild degenerative changes.  No definite pelvic fractures.  Advanced degenerative changes noted in the lower lumbar spine could  IMPRESSION: Degenerative changes but no definite acute hip or pelvic fracture.   Original Report  Authenticated By: Rudie Meyer, M.D.   US Paracentesis  10/14/2012   *RADIOLOGY REPORT*  Clinical Data: Metastatic adenocarcinoma, peritoneal carcinomatosis, recurrent ascites, abdominal pain.  Request is made for therapeutic paracentesis.  ULTRASOUND GUIDED THERAPEUTIC  PARACENTESIS  An ultrasound guided paracentesis was thoroughly discussed with the patient and questions answered.  The benefits, risks, alternatives and complications were also discussed.  The patient understands and wishes to proceed with the procedure.  Written consent was obtained.  Ultrasound was performed to localize and mark an adequate pocket of fluid in the right lower quadrant of the abdomen.  The area was then prepped and draped in the normal sterile fashion.  1% Lidocaine was used for local anesthesia.  Under ultrasound guidance a 19 gauge Yueh catheter was introduced.  Paracentesis was performed.  The catheter was removed and a dressing applied.  Complications:  none  Findings:  A total of approximately 3.9 liters of bloody fluid was removed.  IMPRESSION: Successful ultrasound guided therapeutic paracentesis yielding 3.9 liters of ascites.  Read by: Jeananne Rama, P.A.-C    Original Report Authenticated By: Judie Petit. Miles Costain, M.D.   US Paracentesis  10/06/2012   *RADIOLOGY REPORT*  Clinical Data: Ascites  ULTRASOUND GUIDED PARACENTESIS  An ultrasound guided paracentesis was thoroughly discussed with the patient and questions answered.  The benefits, risks, alternatives and complications were also discussed.  The patient understands and wishes to proceed with the procedure.  Written consent was obtained.  Ultrasound was performed to localize and mark an adequate pocket of fluid in the right lower quadrant of the abdomen.  The area was then prepped and draped in the normal sterile fashion.  1% Lidocaine was used for local anesthesia.  Under ultrasound guidance a 19 gauge Yueh catheter was introduced.  Paracentesis was performed.  The catheter was removed and a dressing applied.  Complications:  none  Findings:  A total of approximately 3.8 liters of blood tinged fluid was removed.  A fluid sample was not sent for laboratory analysis.  IMPRESSION: Successful ultrasound guided paracentesis yielding 3.8 liters of ascites.  Read By: Pattricia Boss PA-C   Original Report Authenticated By: Richarda Overlie, M.D.   US Paracentesis  09/21/2012   *RADIOLOGY REPORT*  Clinical Data: Ascites  ULTRASOUND GUIDED PARACENTESIS  An ultrasound guided paracentesis was thoroughly discussed with the patient and questions answered.  The benefits, risks, alternatives and complications were also discussed.  The patient understands and wishes to proceed with the procedure.  Written consent was obtained.  Ultrasound was performed to localize and mark an adequate pocket of fluid in the right lower quadrant of the abdomen.  The area was then prepped and draped in the normal sterile fashion.  1% Lidocaine was used for local anesthesia.  Under ultrasound guidance a 19 gauge Yueh catheter was introduced.  Paracentesis was performed.  The catheter was removed and a dressing applied.  Complications:  none  Findings:  A total of  approximately 4.6 liters of blood tinged fluid was removed.  A fluid sample was not sent for laboratory analysis.  IMPRESSION: Successful ultrasound guided paracentesis yielding 4.6 liters of ascites.  Read By: Pattricia Boss PA-C   Original Report Authenticated By: Tacey Ruiz, MD   Dg Chest Portable 1 View  10/13/2012   *RADIOLOGY REPORT*  Clinical Data: Fall, stroke  PORTABLE CHEST - 1 VIEW  Comparison: 07/14/2012  Findings: Cardiomediastinal silhouette is stable.  Mild emphysematous changes.  No pulmonary edema.  There is hazy right base medially  atelectasis or infiltrate.  Extensive degenerative changes bilateral shoulders.  IMPRESSION:  Mild emphysematous changes.  No pulmonary edema.  There is hazy right base medially atelectasis or infiltrate.  Extensive degenerative changes bilateral shoulders.   Original Report Authenticated By: Natasha Mead, M.D.   Dg Swallowing Func-speech Pathology  10/14/2012   Lacinda Axon, CCC-SLP     10/14/2012  5:55 PM Objective Swallowing Evaluation: Modified Barium Swallowing Study   Patient Details  Name: [name] MRN: [id number] Date of Birth: December 25, 1922  Today's Date: 10/14/2012 Time: 4098-1191 SLP Time Calculation (min): 33 min  Past Medical History:  Past Medical History  Diagnosis Date  . Cataract   . Diabetes mellitus   . Cancer    Past Surgical History:  Past Surgical History  Procedure Laterality Date  . Bladder surgery  06/1989  . Knee surgery  10/30/1993    Lt   . Back surgery  20/11/1998    ruptured disc  . Colon surgery    . Hernia repair     HPI:  RACHEL RISON is a 79 y.o. female with MPH of Metastatic  adenocarcinoma -carcinomatosis Dx- 2012 (was previous on  chemo-xeloda, pantimumamab, but stopped due to progression of  cancer/hospice), malignant ascites on weekly paracentesis, DM,  HTN, OSA, depression, chronic cancer related pain who woke up   early this morning with pain. Later she got up to use the  bathroom she had some difficulty then fell. When  her aide found  her she was on the floor she had a left facial droop and was weak  on the left side. EMS was called and the patient was brought in  as a code stroke. Initial NIHSS of 8.  MBS ordered following  results of BSE.     Assessment / Plan / Recommendation Clinical Impression  Dysphagia Diagnosis: Moderate oral phase dysphagia;Mild  pharyngeal phase dysphagia;Moderate cervical esophageal phase  dysphagia Moderate sensory motor oral dysphagia marked by labial and  lingual, buccal weakness on left with decreased lingual  coordination.  Xerostomia noted.  Slow oral transit with  piecemeal swallows with all consistencies.  Moderate sensory  motor pharyngeal dysphagia marked by delay in initiation,  decreased TBR, decreased laryngeal elevation,  and  reduced  pharyngeal peristalsis.  Deep penetration during swallow of thin  liquid barium by straw with delayed throat clear.  Patient unable  to clear penetrates with instructed cough.  Modified  Cup sips  effective in eliminating further penetration.  Minimal residue in  vallecular space, posterior pharyngeal wall and pyriforms with  puree and mechanical soft consistencies but cleared with  spontaneous second swallow. Brief esophageal sweep indicates   moderate to severe cervical esophageal dysphagia. No radiologist  present to confirm.   Diffuse stasis with noted backflow to  cervical portion with soft solids. Whole barium tablet retained  in thoracic portion requiring sips of thin liquid and dry  swallows to continue bolus transit.  Recommend to initiate  dysphagia 2 diet consistency with thin liquids by cup sips only  with total assist with each meal to provide cues as needed.   Recommend aspiration and strict reflux precautions as aspiration  risk remains high s/p swallow.  Recommend to crush medication as  able  and administer in puree consistency.  Diagnostic treatment  completed s/p evaluation focusing on providing diet  recommendations and strategies to  improve safety to family  members, patient, and caregivers.  Patient may benefit from GI  consult to further assess  esophageal functioning.  ST to follow  closely in acute care setting to monitor for aspiration and for  diet tolerance.  Defer diet advancement to treating SLP with  clinical improvement.         Diet Recommendation Dysphagia 2 (Fine chop);Thin liquid   Liquid Administration via: Cup;No straw Medication Administration: Crushed with puree Supervision: Full supervision/cueing for compensatory  strategies;Staff feed patient Compensations: Slow rate;Small sips/bites;Check for  pocketing;Multiple dry swallows after each bite/sip Postural Changes and/or Swallow Maneuvers: Seated upright 90  degrees;Upright 30-60 min after meal    Other  Recommendations Recommended Consults: Consider GI  evaluation Oral Care Recommendations: Oral care before and after PO Other Recommendations: Clarify dietary restrictions   Follow Up Recommendations  Inpatient Rehab    Frequency and Duration min 2x/week  2 weeks       SLP Swallow Goals Patient will consume recommended diet without observed clinical  signs of aspiration with: Minimal assistance Patient will utilize recommended strategies during swallow to  increase swallowing safety with: Minimal assistance   General Date of Onset: 10/13/12 HPI: [name] is a 79 y.o. female with MPH of Metastatic  adenocarcinoma -carcinomatosis Dx- 2012 (was previous on  chemo-xeloda, pantimumamab, but stopped due to progression of  cancer/hospice), malignant ascites on weekly paracentesis, DM,  HTN, OSA, depression, chronic cancer related pain who woke up   early this morning with pain. Later she got up to use the  bathroom she had some difficulty then fell. When her aide found  her she was on the floor she had a left facial droop and was weak  on the left side. EMS was called and the patient was brought in  as a code stroke. Initial NIHSS of 8. Type of Study: Modified Barium  Swallowing Study Reason for Referral: Objectively evaluate swallowing function Previous Swallow Assessment: BSE 10/14/12 NPO  Diet Prior to this Study: NPO Temperature Spikes Noted: No Respiratory Status: Supplemental O2 delivered via (comment)  (nasal cannula ) History of Recent Intubation: No Behavior/Cognition: Alert;Cooperative;Pleasant mood Oral Cavity - Dentition: Adequate natural dentition Oral Motor / Sensory Function: Impaired - see Bedside swallow  eval Self-Feeding Abilities: Needs assist Patient Positioning: Upright in chair Baseline Vocal Quality: Breathy;Low vocal intensity Volitional Cough: Strong Volitional Swallow: Able to elicit Anatomy: Within functional limits Pharyngeal Secretions: Not observed secondary MBS    Reason for Referral Objectively evaluate swallowing function   Oral Phase Oral Preparation/Oral Phase Oral Phase: Impaired Oral - Nectar Oral - Nectar Teaspoon: Weak lingual manipulation;Incomplete  tongue to palate contact;Reduced posterior propulsion;Piecemeal  swallowing;Lingual/palatal residue;Delayed oral transit Oral - Nectar Cup: Weak lingual manipulation;Incomplete tongue to  palate contact;Reduced posterior propulsion;Lingual/palatal  residue;Piecemeal swallowing;Delayed oral transit Oral - Nectar Straw: Weak lingual manipulation;Lingual/palatal  residue;Piecemeal swallowing Oral - Thin Oral - Thin Teaspoon: Lingual pumping;Incomplete tongue to palate  contact;Reduced posterior propulsion;Lingual/palatal  residue;Piecemeal swallowing;Delayed oral transit Oral - Thin Cup: Weak lingual manipulation;Reduced posterior  propulsion;Incomplete tongue to palate contact;Lingual/palatal  residue;Delayed oral transit Oral - Thin Straw: Weak lingual manipulation;Reduced posterior  propulsion;Lingual/palatal residue;Piecemeal swallowing;Delayed  oral transit Oral - Solids Oral - Puree: Weak lingual manipulation;Lingual pumping;Reduced  posterior propulsion;Lingual/palatal residue;Piecemeal   swallowing;Delayed oral transit Oral - Mechanical Soft: Impaired mastication;Reduced posterior  propulsion;Piecemeal swallowing;Lingual/palatal residue;Delayed  oral transit;Weak lingual manipulation   Pharyngeal Phase Pharyngeal Phase Pharyngeal Phase: Impaired Pharyngeal - Nectar Pharyngeal - Nectar Teaspoon: Premature spillage to  valleculae;Reduced tongue base retraction;Reduced laryngeal  elevation;Pharyngeal residue - valleculae;Reduced pharyngeal  peristalsis Pharyngeal - Nectar Cup: Premature  spillage to  valleculae;Premature spillage to pyriform sinuses;Delayed swallow  initiation;Reduced pharyngeal peristalsis;Reduced laryngeal  elevation;Reduced tongue base retraction;Pharyngeal residue -  posterior pharnyx;Pharyngeal residue - pyriform sinuses Pharyngeal - Nectar Straw: Premature spillage to pyriform  sinuses;Reduced pharyngeal peristalsis;Delayed swallow  initiation;Reduced tongue base retraction;Reduced laryngeal  elevation;Pharyngeal residue - posterior pharnyx;Pharyngeal  residue - pyriform sinuses;Pharyngeal residue - valleculae Pharyngeal - Thin Pharyngeal - Thin Teaspoon: Premature spillage to  valleculae;Premature spillage to pyriform sinuses;Delayed swallow  initiation;Reduced pharyngeal peristalsis;Reduced tongue base  retraction;Pharyngeal residue - valleculae;Pharyngeal residue -  posterior pharnyx;Pharyngeal residue - pyriform sinuses Pharyngeal - Thin Cup: Premature spillage to pyriform  sinuses;Delayed swallow initiation;Reduced pharyngeal  peristalsis;Reduced laryngeal elevation;Reduced tongue base  retraction Pharyngeal - Thin Straw: Premature spillage to pyriform  sinuses;Reduced pharyngeal peristalsis;Delayed swallow  initiation;Reduced tongue base retraction;Penetration/Aspiration  during swallow;Pharyngeal residue - valleculae;Pharyngeal residue  - pyriform sinuses Penetration/Aspiration details (thin straw): Material enters  airway, CONTACTS cords and not ejected out Pharyngeal -  Solids Pharyngeal - Puree: Premature spillage to pyriform  sinuses;Reduced pharyngeal peristalsis;Delayed swallow  initiation;Reduced laryngeal elevation;Reduced tongue base  retraction;Pharyngeal residue - valleculae;Pharyngeal residue -  posterior pharnyx;Pharyngeal residue - pyriform sinuses Pharyngeal - Mechanical Soft: Premature spillage to pyriform  sinuses;Reduced pharyngeal peristalsis;Delayed swallow  initiation;Reduced laryngeal elevation;Reduced tongue base  retraction;Pharyngeal residue - valleculae;Pharyngeal residue -  posterior pharnyx;Pharyngeal residue - pyriform sinuses Pharyngeal - Pill: Premature spillage to valleculae  Cervical Esophageal Phase    GO    Cervical Esophageal Phase Cervical Esophageal Phase: Impaired Cervical Esophageal Phase - Solids Puree: Prominent cricopharyngeal segment;Esophageal backflow into  cervical esophagus Mechanical Soft: Esophageal backflow into cervical  esophagus;Prominent cricopharyngeal segment Pill: Esophageal backflow into cervical esophagus;Prominent  cricopharyngeal segment        Moreen Fowler MS, CCC-SLP [phone] [facility] 10/14/2012, 5:37 PM    Mr [name] Head/brain Wo Cm  10/14/2012   CLINICAL DATA:  79 year old female with fall, subsequently discovered to have left facial droop and left-sided weakness. Code stroke. History of metastatic cancer.  EXAM: MRI HEAD WITHOUT CONTRAST  MRA HEAD WITHOUT CONTRAST  TECHNIQUE: Multiplanar, multiecho pulse sequences of the brain and surrounding structures were obtained without intravenous contrast. Angiographic images of the head were obtained using MRA technique without contrast.  COMPARISON:  Head and cervical spine CT 10/13/2012.  FINDINGS: MRI HEAD FINDINGS  Gyriform restricted diffusion at the posterior right insula and posterior temporal operculum. Confluent wedge-shaped area of restricted diffusion in the poster right temporal lobe a or parietal lobe involving a 2 cm area. Scattered additional posterior right  MCA territory small cortically based infarcts.  There is also evidence of several punctate or lacunar acute infarcts not in the right MCA territory: Left parietal lobe on series 8, image 32, left inferior parietal lobe on image 27, and right posterior cerebellar hemisphere on image 10. Heterogeneous diffusion in the brainstem but no definite acute brainstem infarct.  Mild associated T2 and FLAIR hyperintensity in these areas. No associated mass effect. Punctate petechial hemorrhage in the right parietal lobe (series 9, image 16. Otherwise no intracranial hemorrhage.  Major intracranial vascular flow voids are preserved although there is asymmetric increased signal in some right posterior MCA branches on FLAIR imaging. See MRA findings below.  No midline shift, mass effect, or evidence of intracranial mass lesion. Ventricular size and configuration are within normal limits. Negative pituitary, cervicomedullary junction and visualized cervical spine. Increased fluid or mucosal thickening in the sphenoid sinus. Left posterior convexity scalp hematoma. No acute orbits soft tissue findings. Mastoids are clear.  MRA HEAD FINDINGS  Study is mildly degraded by motion artifact despite repeated imaging attempts.  Antegrade flow in the posterior circulation. Mildly dominant distal right vertebral artery. Distal vertebral artery irregularity compatible with atherosclerosis but no stenosis. Patent vertebrobasilar junction. Dominant AICA vessels. No basilar stenosis. SCA origins and left PCA origin are normal. Fetal type right PCA origin. Bilateral PCA branches are within normal limits.  Antegrade flow in both ICA siphons. ICA irregularity compatible with atherosclerosis but no subsequent stenosis. Ophthalmic and right posterior communicating artery origins are within normal limits. Left posterior communicating artery is diminutive or absent. Carotid termini, MCA and ACA origins are within normal limits. Mild motion artifact at  the level of the anterior communicating artery. Visualized ACA branches are within normal limits. Left MCA M1 segment and visualized left MCA branches are within normal limits.  Right MCA M1 segment is within normal limits. Right MCA bifurcation is patent. No right MCA major branch occlusion is identified.  IMPRESSION: MRI HEAD IMPRESSION  1. 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 No mass effect. Punctate petechial hemorrhage in the right parietal lobe.  2. Several tiny acute infarcts also in the posterior left MCA territory and also the right cerebellum. This raises the possibility of the recent embolic shower, but might instead represent synchronous small vessel ischemia.  3. Small left scalp hematoma.  MRA HEAD IMPRESSION  Negative intracranial MRA. Mild for age intracranial atherosclerosis with no hemodynamically significant stenosis or major right MCA or circle of Willis branch occlusion identified.   Electronically Signed   By: Augusto Gamble   On: 10/14/2012 11:44    Microbiology: Recent Results (from the past 240 hour(s))  URINE CULTURE     Status: None   Collection Time    10/13/12 11:02 AM      Result Value Range Status   Specimen Description URINE, RANDOM   Final   Special Requests NONE   Final   Culture  Setup Time     Final   Value: 10/13/2012 12:21     Performed at [facility] Count     Final   Value: >=100,000 COLONIES/ML     Performed at [facility]   Culture     Final   Value: Multiple bacterial morphotypes present, none predominant. Suggest appropriate recollection if clinically indicated.     Performed at [facility]   Report Status 10/15/2012 FINAL   Final     Labs: Results for orders placed during the hospital encounter of 10/13/12  URINE CULTURE      Result Value Range   Specimen Description URINE, RANDOM     Special Requests NONE     Culture  Setup Time       Value: 10/13/2012 12:21     Performed  at [facility] Count       Value: >=100,000 COLONIES/ML     Performed at [facility]   Culture       Value: Multiple bacterial morphotypes present, none predominant. Suggest appropriate recollection if clinically indicated.     Performed at [facility]   Report Status 10/15/2012 FINAL    ETHANOL      Result Value Range   Alcohol, Ethyl (B) <11  0 - 11 mg/dL  PROTIME-INR      Result Value Range   Prothrombin Time 12.3  11.6 - 15.2 seconds   INR 0.93  0.00 - 1.49  APTT      Result Value Range   aPTT 26  24 - 37 seconds  CBC      Result Value Range   WBC 8.7  4.0 - 10.5 K/uL   RBC 3.30 (*) 3.87 - 5.11 MIL/uL   Hemoglobin 6.8 (*) 12.0 - 15.0 g/dL   HCT 16.1 (*) 09.6 - 04.5 %   MCV 72.7 (*) 78.0 - 100.0 fL   MCH 20.6 (*) 26.0 - 34.0 pg   MCHC 28.3 (*) 30.0 - 36.0 g/dL   RDW 40.9 (*) 81.1 - 91.4 %   Platelets 381  150 - 400 K/uL  DIFFERENTIAL      Result Value Range   Neutrophils Relative % 73  43 - 77 %   Lymphocytes Relative 14  12 - 46 %   Monocytes Relative 9  3 - 12 %   Eosinophils Relative 3  0 - 5 %   Basophils Relative 1  0 - 1 %   Neutro Abs 6.3  1.7 - 7.7 K/uL   Lymphs Abs 1.2  0.7 - 4.0 K/uL   Monocytes Absolute 0.8  0.1 - 1.0 K/uL   Eosinophils Absolute 0.3  0.0 - 0.7 K/uL   Basophils Absolute 0.1  0.0 - 0.1 K/uL   RBC Morphology POLYCHROMASIA PRESENT     WBC Morphology ATYPICAL LYMPHOCYTES    COMPREHENSIVE METABOLIC PANEL      Result Value Range   Sodium 141  135 - 145 mEq/L   Potassium 4.2  3.5 - 5.1 mEq/L   Chloride 105  96 - 112 mEq/L   CO2 26  19 - 32 mEq/L   Glucose, Bld 147 (*) 70 - 99 mg/dL   BUN 17  6 - 23 mg/dL   Creatinine, Ser 7.82  0.50 - 1.10 mg/dL   Calcium 8.7  8.4 - 95.6 mg/dL   Total Protein 6.0  6.0 - 8.3 g/dL   Albumin 2.2 (*) 3.5 - 5.2 g/dL   AST 13  0 - 37 U/L   ALT 9  0 - 35 U/L   Alkaline Phosphatase 55  39 - 117 U/L   Total Bilirubin <0.1 (*) 0.3 - 1.2 mg/dL   GFR calc non Af Amer 77 (*) >90  mL/min   GFR calc Af Amer 89 (*) >90 mL/min  TROPONIN I      Result Value Range   Troponin I <0.30  <0.30 ng/mL  URINE RAPID DRUG SCREEN (HOSP PERFORMED)      Result Value Range   Opiates NONE DETECTED  NONE DETECTED   Cocaine NONE DETECTED  NONE DETECTED   Benzodiazepines NONE DETECTED  NONE DETECTED   Amphetamines NONE DETECTED  NONE DETECTED   Tetrahydrocannabinol NONE DETECTED  NONE DETECTED   Barbiturates NONE DETECTED  NONE DETECTED  URINALYSIS, ROUTINE W REFLEX MICROSCOPIC      Result Value Range   Color, Urine YELLOW  YELLOW   APPearance HAZY (*) CLEAR   Specific Gravity, Urine 1.020  1.005 - 1.030   pH 5.0  5.0 - 8.0   Glucose, UA NEGATIVE  NEGATIVE mg/dL   Hgb urine dipstick NEGATIVE  NEGATIVE   Bilirubin Urine NEGATIVE  NEGATIVE   Ketones, ur NEGATIVE  NEGATIVE mg/dL   Protein, ur NEGATIVE  NEGATIVE mg/dL   Urobilinogen, UA 0.2  0.0 - 1.0 mg/dL   Nitrite POSITIVE (*) NEGATIVE   Leukocytes, UA NEGATIVE  NEGATIVE  GLUCOSE, CAPILLARY      Result Value  Range   Glucose-Capillary 128 (*) 70 - 99 mg/dL  CBC      Result Value Range   WBC 9.2  4.0 - 10.5 K/uL   RBC 3.23 (*) 3.87 - 5.11 MIL/uL   Hemoglobin 6.8 (*) 12.0 - 15.0 g/dL   HCT 16.1 (*) 09.6 - 04.5 %   MCV 72.8 (*) 78.0 - 100.0 fL   MCH 21.1 (*) 26.0 - 34.0 pg   MCHC 28.9 (*) 30.0 - 36.0 g/dL   RDW 40.9 (*) 81.1 - 91.4 %   Platelets 396  150 - 400 K/uL  URINE MICROSCOPIC-ADD ON      Result Value Range   Squamous Epithelial / LPF FEW (*) RARE   WBC, UA 0-2  <3 WBC/hpf   RBC / HPF 0-2  <3 RBC/hpf   Bacteria, UA MANY (*) RARE  HEMOGLOBIN A1C      Result Value Range   Hemoglobin A1C 6.2 (*) <5.7 %   Mean Plasma Glucose 131 (*) <117 mg/dL  LIPID PANEL      Result Value Range   Cholesterol 121  0 - 200 mg/dL   Triglycerides 782  <956 mg/dL   HDL 39 (*) >21 mg/dL   Total CHOL/HDL Ratio 3.1     VLDL 23  0 - 40 mg/dL   LDL Cholesterol 59  0 - 99 mg/dL  CBC      Result Value Range   WBC 6.0  4.0 - 10.5  K/uL   RBC 3.19 (*) 3.87 - 5.11 MIL/uL   Hemoglobin 7.0 (*) 12.0 - 15.0 g/dL   HCT 30.8 (*) 65.7 - 84.6 %   MCV 74.0 (*) 78.0 - 100.0 fL   MCH 21.9 (*) 26.0 - 34.0 pg   MCHC 29.7 (*) 30.0 - 36.0 g/dL   RDW 96.2 (*) 95.2 - 84.1 %   Platelets 345  150 - 400 K/uL  GLUCOSE, CAPILLARY      Result Value Range   Glucose-Capillary 88  70 - 99 mg/dL   Comment 1 Notify RN    GLUCOSE, CAPILLARY      Result Value Range   Glucose-Capillary 88  70 - 99 mg/dL   Comment 1 Notify RN    GLUCOSE, CAPILLARY      Result Value Range   Glucose-Capillary 99  70 - 99 mg/dL   Comment 1 Notify RN    GLUCOSE, CAPILLARY      Result Value Range   Glucose-Capillary 97  70 - 99 mg/dL   Comment 1 Notify RN    CBC      Result Value Range   WBC 7.2  4.0 - 10.5 K/uL   RBC 3.71 (*) 3.87 - 5.11 MIL/uL   Hemoglobin 8.4 (*) 12.0 - 15.0 g/dL   HCT 32.4 (*) 40.1 - 02.7 %   MCV 74.9 (*) 78.0 - 100.0 fL   MCH 22.6 (*) 26.0 - 34.0 pg   MCHC 30.2  30.0 - 36.0 g/dL   RDW 25.3 (*) 66.4 - 40.3 %   Platelets 328  150 - 400 K/uL  BASIC METABOLIC PANEL      Result Value Range   Sodium 141  135 - 145 mEq/L   Potassium 3.4 (*) 3.5 - 5.1 mEq/L   Chloride 106  96 - 112 mEq/L   CO2 27  19 - 32 mEq/L   Glucose, Bld 136 (*) 70 - 99 mg/dL   BUN 10  6 - 23 mg/dL   Creatinine,  Ser 0.67  0.50 - 1.10 mg/dL   Calcium 8.2 (*) 8.4 - 10.5 mg/dL   GFR calc non Af Amer 76 (*) >90 mL/min   GFR calc Af Amer 88 (*) >90 mL/min  GLUCOSE, CAPILLARY      Result Value Range   Glucose-Capillary 145 (*) 70 - 99 mg/dL  GLUCOSE, CAPILLARY      Result Value Range   Glucose-Capillary 114 (*) 70 - 99 mg/dL   Comment 1 Notify RN    GLUCOSE, CAPILLARY      Result Value Range   Glucose-Capillary 103 (*) 70 - 99 mg/dL   Comment 1 Notify RN     Comment 2 Documented in Chart    GLUCOSE, CAPILLARY      Result Value Range   Glucose-Capillary 199 (*) 70 - 99 mg/dL   Comment 1 Notify RN     Comment 2 Documented in Chart    GLUCOSE, CAPILLARY       Result Value Range   Glucose-Capillary 107 (*) 70 - 99 mg/dL  CBC      Result Value Range   WBC 8.5  4.0 - 10.5 K/uL   RBC 4.36  3.87 - 5.11 MIL/uL   Hemoglobin 10.1 (*) 12.0 - 15.0 g/dL   HCT 78.2 (*) 95.6 - 21.3 %   MCV 77.3 (*) 78.0 - 100.0 fL   MCH 23.2 (*) 26.0 - 34.0 pg   MCHC 30.0  30.0 - 36.0 g/dL   RDW 08.6 (*) 57.8 - 46.9 %   Platelets 336  150 - 400 K/uL  BASIC METABOLIC PANEL      Result Value Range   Sodium 142  135 - 145 mEq/L   Potassium 4.4  3.5 - 5.1 mEq/L   Chloride 105  96 - 112 mEq/L   CO2 30  19 - 32 mEq/L   Glucose, Bld 144 (*) 70 - 99 mg/dL   BUN 11  6 - 23 mg/dL   Creatinine, Ser 6.29  0.50 - 1.10 mg/dL   Calcium 8.5  8.4 - 52.8 mg/dL   GFR calc non Af Amer 78 (*) >90 mL/min   GFR calc Af Amer >90  >90 mL/min  GLUCOSE, CAPILLARY      Result Value Range   Glucose-Capillary 140 (*) 70 - 99 mg/dL  GLUCOSE, CAPILLARY      Result Value Range   Glucose-Capillary 120 (*) 70 - 99 mg/dL   Comment 1 Documented in Chart     Comment 2 Notify RN    GLUCOSE, CAPILLARY      Result Value Range   Glucose-Capillary 146 (*) 70 - 99 mg/dL   Comment 1 Documented in Chart     Comment 2 Notify RN    GLUCOSE, CAPILLARY      Result Value Range   Glucose-Capillary 143 (*) 70 - 99 mg/dL  GLUCOSE, CAPILLARY      Result Value Range   Glucose-Capillary 135 (*) 70 - 99 mg/dL   Comment 1 Notify RN    GLUCOSE, CAPILLARY      Result Value Range   Glucose-Capillary 94  70 - 99 mg/dL  GLUCOSE, CAPILLARY      Result Value Range   Glucose-Capillary 150 (*) 70 - 99 mg/dL   Comment 1 Notify RN    GLUCOSE, CAPILLARY      Result Value Range   Glucose-Capillary 155 (*) 70 - 99 mg/dL   Comment 1 Notify RN    GLUCOSE, CAPILLARY  Result Value Range   Glucose-Capillary 171 (*) 70 - 99 mg/dL  POCT I-STAT, CHEM 8      Result Value Range   Sodium 142  135 - 145 mEq/L   Potassium 4.2  3.5 - 5.1 mEq/L   Chloride 103  96 - 112 mEq/L   BUN 17  6 - 23 mg/dL   Creatinine, Ser  1.61  0.50 - 1.10 mg/dL   Glucose, Bld 096 (*) 70 - 99 mg/dL   Calcium, Ion 0.45  4.09 - 1.30 mmol/L   TCO2 28  0 - 100 mmol/L   Hemoglobin 8.2 (*) 12.0 - 15.0 g/dL   HCT 81.1 (*) 91.4 - 78.2 %  POCT I-STAT TROPONIN I      Result Value Range   Troponin i, poc 0.07  0.00 - 0.08 ng/mL   Comment 3           OCCULT BLOOD, POC DEVICE      Result Value Range   Fecal Occult Bld POSITIVE (*) NEGATIVE  TYPE AND SCREEN      Result Value Range   ABO/RH(D) O NEG     Antibody Screen NEG     Sample Expiration 10/16/2012     Unit Number [id number]     Blood Component Type RBC CPDA1, LR     Unit division 00     Status of Unit DISCARDED     Transfusion Status OK TO TRANSFUSE     Crossmatch Result Compatible     Unit Number [id number]     Blood Component Type RED CELLS,LR     Unit division 00     Status of Unit ISSUED,FINAL     Transfusion Status OK TO TRANSFUSE     Crossmatch Result Compatible     Unit Number [id number]     Blood Component Type RBC LR PHER2     Unit division 00     Status of Unit ISSUED,FINAL     Transfusion Status OK TO TRANSFUSE     Crossmatch Result Compatible     Unit Number [id number]     Blood Component Type RED CELLS,LR     Unit division 00     Status of Unit ISSUED,FINAL     Transfusion Status OK TO TRANSFUSE     Crossmatch Result Compatible    PREPARE RBC (CROSSMATCH)      Result Value Range   Order Confirmation ORDER PROCESSED BY BLOOD BANK    PREPARE RBC (CROSSMATCH)      Result Value Range   Order Confirmation ORDER PROCESSED BY BLOOD BANK      Time coordinating discharge: 38 minutes  Signed: Pearla Dubonnet, MD 10/18/2012, 8:04 AM

## 2012-10-18 NOTE — Plan of Care (Signed)
Problem: Consults Goal: RH STROKE PATIENT EDUCATION See Patient Education module for education specifics  Outcome: Progressing Gave patient Stroke Rehabilitation/ Hope A stroke Recovery quide

## 2012-10-18 NOTE — Progress Notes (Signed)
I met with patient and her daughter, Rosey Bath, at bedside. Discussed Dr. Rosalyn Charters recommendations.  Discussed with RNCM and SW.  I await PT progress this morning to determine most appropriate rehab venue. I will f/u today. [phone]

## 2012-10-18 NOTE — H&P (View-Only) (Signed)
Physical Medicine and Rehabilitation Admission H&P    Chief Complaint  Patient presents with  . Code Stroke  : HPI: [name] is a 79 y.o. right-handed female with history of metastatic adenocarcinoma diagnosed 2012.previous chemotherapy treatments but stopped due to progression of her cancer, malignant ascites, diabetes mellitus peripheral neuropathy. Patient independent with rolling walker prior to admission. Admitted 10/13/2012 after a fall with noted slurred speech and left-sided weakness. MRI of the brain showed scattered small acute posterior right MCA territory infarcts. Several tiny acute infarcts also in the posterior left MCA territory. MRA of the head negative. Echocardiogram with ejection fraction of 55% normal systolic function. Carotid Dopplers with no ICA stenosis.  Patient did not receive TPA. Neurology services consulted placed on aspirin therapy for CVA prophylaxis. Speech therapy for dysphagia currently maintained on a dysphagia 2 thin liquid diet. Noted chronic anemia hemoglobin 6.8 patient has been transfused with latest hemoglobin 10.1. Patient is being followed by hospice care for history of progressive metastatic adenocarcinoma. She does undergo weekly therapeutic paracentesis for chronic ascites and followed by oncology services Dr. Arnoldo Lenis. Physical and occupational therapy evaluations have been completed with recommendations of physical medicine rehabilitation consult to consider inpatient rehabilitation services. Patient was felt to be a good candidate for inpatient rehabilitation services and was admitted for comprehensive rehabilitation program   Review of Systems  Gastrointestinal:  Abdominal ascites  Musculoskeletal: Positive for back pain.  Neurological: Positive for weakness.  Psychiatric/Behavioral: Positive for depression.  All other systems reviewed and are negative  Past Medical History  Diagnosis Date  . Cataract   . Diabetes mellitus   .  Cancer    Past Surgical History  Procedure Laterality Date  . Bladder surgery  06/1989  . Knee surgery  10/30/1993    Lt   . Back surgery  20/11/1998    ruptured disc  . Colon surgery    . Hernia repair     History reviewed. No pertinent family history. Social History:  reports that she has never smoked. She has never used smokeless tobacco. She reports that she does not drink alcohol. Her drug history is not on file. Allergies:  Allergies  Allergen Reactions  . Oxycodone Other (See Comments)    hallucinations  . Ace Inhibitors Other (See Comments)    Reaction unknown  . Ciprofloxacin Hcl Other (See Comments)    Reaction unknown  . Hydrocodone-Acetaminophen Other (See Comments)    Reports made her "crazy"   Medications Prior to Admission  Medication Sig Dispense Refill  . buPROPion (WELLBUTRIN SR) 100 MG 12 hr tablet Take 100 mg by mouth daily.       . carvedilol (COREG) 3.125 MG tablet Take 3.125 mg by mouth 2 (two) times daily with a meal.        . KLOR-CON M20 20 MEQ tablet Take 10 mEq by mouth daily.       . polyethylene glycol (MIRALAX / GLYCOLAX) packet Take 17 g by mouth daily.      Marland Kitchen senna (SENOKOT) 8.6 MG tablet Take 1 tablet by mouth 2 (two) times daily.       . traMADol (ULTRAM) 50 MG tablet Take 100 mg by mouth every 6 (six) hours as needed for pain.      Marland Kitchen venlafaxine (EFFEXOR) 75 MG tablet Take 75 mg by mouth daily.       . [DISCONTINUED] aspirin 81 MG tablet Take 81 mg by mouth daily.        . [  DISCONTINUED] furosemide (LASIX) 20 MG tablet Take 40 mg by mouth daily.       . [DISCONTINUED] lisinopril (PRINIVIL,ZESTRIL) 5 MG tablet Take 5 mg by mouth daily.        . [DISCONTINUED] meloxicam (MOBIC) 15 MG tablet Take 15 mg by mouth daily.       . [DISCONTINUED] metFORMIN (GLUCOPHAGE) 500 MG tablet Take 500 mg by mouth 2 (two) times daily.       . [DISCONTINUED] ondansetron (ZOFRAN) 8 MG tablet Take 1 tablet (8 mg total) by mouth every 12 (twelve) hours as needed for  nausea.  60 tablet  1  . [DISCONTINUED] prochlorperazine (COMPAZINE) 5 MG tablet Take 1 tablet (5 mg total) by mouth every 6 (six) hours as needed for nausea.  30 tablet  0    Home: Home Living Family/patient expects to be discharged to:: Inpatient rehab Living Arrangements: Spouse/significant other Available Help at Discharge: Personal care attendant;Available 24 hours/day Type of Home: House Home Access: Ramped entrance Home Layout: One level Home Equipment: Walker - 2 wheels;Shower seat;Walker - 4 wheels;Bedside commode  Lives With: Family   Functional History: Prior Function Vocation: Retired  Functional Status:  Mobility: Bed Mobility Bed Mobility: Not assessed Supine to Sit: 4: Min assist;With rails;HOB elevated Sitting - Scoot to [place] of Bed: 4: Min assist;With rail Transfers Transfers: Stand to [employer];Sit to Stand Sit to Stand: 4: Min assist;With upper extremity assist;With armrests;From chair/3-in-1;From toilet Stand to Sit: 4: Min assist;With upper extremity assist;To chair/3-in-1;To toilet Ambulation/Gait Ambulation/Gait Assistance: 4: Min assist Ambulation/Gait: Patient Percentage: 50% Ambulation Distance (Feet): 60 Feet (30' x 2 with seated rest break) Assistive device: 4-wheeled walker Ambulation/Gait Assistance Details: min assist to support trunk for balance, increased DOE (3/4) and HR (120-145) with gait requiring seated rest break 1/2 way through.  O2 sats while walking 93% on RA are actually better than when she sits down 87-90% on RA (likely due to compression of abdomen).   Gait Pattern: Step-to pattern;Shuffle;Decreased weight shift to right;Lateral trunk lean to left;Trunk flexed;Narrow base of support Gait velocity: less than 1.8 ft/sec putting her at risk for recurrent falls General Gait Details: HR into the 130s with short distance gait. O2 sats 92% on RA despite DOE 93%.   Stairs: No    ADL: ADL Eating/Feeding: NPO Grooming: Wash/dry hands;Wash/dry  face;Min guard Where Assessed - Grooming: Supported standing Upper Body Bathing: Moderate assistance Where Assessed - Upper Body Bathing: Supported sitting Lower Body Bathing: Maximal assistance Where Assessed - Lower Body Bathing: Supported sit to stand Upper Body Dressing: Maximal assistance Where Assessed - Upper Body Dressing: Supported sitting Lower Body Dressing: +1 Total assistance Where Assessed - Lower Body Dressing: Supported sit to Pharmacist, hospital: Performed;Minimal Dentist Method: Sit to Barista: Raised toilet seat with arms (or 3-in-1 over toilet);Regular height toilet;Grab bars Tub/Shower Transfer Method: Not assessed Equipment Used: Rolling walker;Gait belt;Other (comment) (3 in 1 over toilet) Transfers/Ambulation Related to ADLs: cues for safety, attention to L side ADL Comments: demonstrates L bias during ADL. Unable to maintain midline posture when distracted. apparent L inattention  Cognition: Cognition Overall Cognitive Status: Impaired/Different from baseline Arousal/Alertness: Awake/alert Orientation Level: Oriented X4 Attention: Sustained Sustained Attention: Impaired Sustained Attention Impairment: Verbal basic Memory: Impaired Memory Impairment: Prospective memory Awareness: Appears intact Problem Solving: Appears intact Executive Function:  (deficits in organizing) Safety/Judgment:  (TBA) Cognition Arousal/Alertness: Awake/alert Behavior During Therapy: WFL for tasks assessed/performed Overall Cognitive Status: Impaired/Different from baseline Area of Impairment:  Attention;Memory;Following commands;Safety/judgement;Awareness;Problem solving Current Attention Level: Sustained Memory: Decreased short-term memory Following Commands: Follows one step commands consistently Safety/Judgement: Decreased awareness of safety;Decreased awareness of deficits Awareness: Emergent Problem Solving: Slow  processing;Difficulty sequencing;Requires verbal cues General Comments: decreased attention L UE/side  Physical Exam: Blood pressure 126/69, pulse 134, temperature 98.6 F (37 C), temperature source Oral, resp. rate 20, height 5\' 1"  (1.549 m), weight 83.008 kg (183 lb), SpO2 89.00%. Constitutional: She is oriented to person, place, and time. Sitting comfortably in her chair  HENT: oral mucosa pink and moist Head: Normocephalic.  Eyes: EOM are normal.  Neck: Normal range of motion. Neck supple. No thyromegaly present.  Cardiovascular: Normal rate and regular rhythm. No murmurs, rubs, gallops Pulmonary/Chest:  Decreased breath sounds at the bases. No wheezes rales or rhonchi Abdominal: Soft. She exhibits mild distension. There is no tenderness. Bowel sounds positive Neurological: She is alert and oriented to person, place, and time. CN exam non-focal. Intact cough Follows commands. Alert and generally appropriate LUE 4/5. LLE 2/5 HF, 3/5 KE, 3+ to 4 with ADF and APF on left. RUE and RLE 4+ to 5/5. No gross sensory impairments. Decreased FMC on left more than right.   Skin: Skin is warm and dry.  Psychiatric: She has a normal mood and affect. Her behavior is normal. Thought content normal   Results for orders placed during the hospital encounter of 10/13/12 (from the past 48 hour(s))  GLUCOSE, CAPILLARY     Status: Abnormal   Collection Time    10/16/12  3:42 PM      Result Value Range   Glucose-Capillary 143 (*) 70 - 99 mg/dL  GLUCOSE, CAPILLARY     Status: Abnormal   Collection Time    10/16/12  8:13 PM      Result Value Range   Glucose-Capillary 135 (*) 70 - 99 mg/dL   Comment 1 Notify RN    GLUCOSE, CAPILLARY     Status: None   Collection Time    10/17/12  7:36 AM      Result Value Range   Glucose-Capillary 94  70 - 99 mg/dL  GLUCOSE, CAPILLARY     Status: Abnormal   Collection Time    10/17/12 11:02 AM      Result Value Range   Glucose-Capillary 150 (*) 70 - 99 mg/dL    Comment 1 Notify RN    GLUCOSE, CAPILLARY     Status: Abnormal   Collection Time    10/17/12  4:26 PM      Result Value Range   Glucose-Capillary 155 (*) 70 - 99 mg/dL   Comment 1 Notify RN    GLUCOSE, CAPILLARY     Status: Abnormal   Collection Time    10/17/12  9:24 PM      Result Value Range   Glucose-Capillary 171 (*) 70 - 99 mg/dL  GLUCOSE, CAPILLARY     Status: Abnormal   Collection Time    10/18/12  7:59 AM      Result Value Range   Glucose-Capillary 118 (*) 70 - 99 mg/dL   Comment 1 Documented in Chart     Comment 2 Notify RN    GLUCOSE, CAPILLARY     Status: Abnormal   Collection Time    10/18/12 11:53 AM      Result Value Range   Glucose-Capillary 164 (*) 70 - 99 mg/dL   Comment 1 Documented in Chart     Comment 2 Notify RN  No results found.  Post Admission Physician Evaluation: 1. Functional deficits secondary  to embolic bicerebral infarct. 2. Patient is admitted to receive collaborative, interdisciplinary care between the physiatrist, rehab nursing staff, and therapy team. 3. Patient's level of medical complexity and substantial therapy needs in context of that medical necessity cannot be provided at a lesser intensity of care such as a SNF. 4. Patient has experienced substantial functional loss from his/her baseline which was documented above under the "Functional History" and "Functional Status" headings.  Judging by the patient's diagnosis, physical exam, and functional history, the patient has potential for functional progress which will result in measurable gains while on inpatient rehab.  These gains will be of substantial and practical use upon discharge  in facilitating mobility and self-care at the household level. 5. Physiatrist will provide 24 hour management of medical needs as well as oversight of the therapy plan/treatment and provide guidance as appropriate regarding the interaction of the two. 6. 24 hour rehab nursing will assist with bladder  management, bowel management, safety, skin/wound care, disease management, medication administration, pain management and patient education  and help integrate therapy concepts, techniques,education, etc. 7. PT will assess and treat for/with: Lower extremity strength, range of motion, stamina, balance, functional mobility, safety, adaptive techniques and equipment, NMR, cognitive perceptual awareness.   Goals are: mod I to supervision. 8. OT will assess and treat for/with: ADL's, functional mobility, safety, upper extremity strength, adaptive techniques and equipment, NMR, cognitive perceptual awareness, education.   Goals are: mod I to supervision. 9. SLP will assess and treat for/with: cognition, swallowing.  Goals are: mod I. 10. Case Management and Social Worker will assess and treat for psychological issues and discharge planning. 11. Team conference will be held weekly to assess progress toward goals and to determine barriers to discharge. 12. Patient will receive at least 3 hours of therapy per day at least 5 days per week. 13. ELOS: 7 days       14. Prognosis:  excellent   Medical Problem List and Plan: 1. Embolic bi cerebral infarcts 2. DVT Prophylaxis/Anticoagulation: SCDs. Monitor for any signs of DVT 3. Pain Management: Duragesic patch 25 mcg change every 72 hours, Lidoderm patch, Ultram as needed. Monitor with increased activity 4. Mood: Wellbutrin 100 mg daily, Effexor 75 mg daily. Provide emotional support 5. Neuropsych: This patient is capable of making decisions on her own behalf. 6. Metastatic adenocarcinoma with malignant ascites. Followup oncology services. Patient receives weekly paracentesis for malignant ascites per interventional radiology with last paracentesis 10/13/2012---follow up with them this week. 7. Dysphagia. Dysphagia 2 nectar liquids. Followup speech therapy. Monitor for any aspiration 8. Chronic anemia. Patient has been transfused. Check Hemoccults stools.  Followup CBC 9. Hypertension. Coreg 3.125 mg twice a day. Monitor with increased activity 10. Obstructive sleep apnea. Continue CPAP 11. Diabetes mellitus with peripheral neuropathy. She is presently off her Glucophage 500 mg twice a day as prior to admission. Hemoglobin A1c 6.2. Check blood sugars a.c. and at bedtime   Ranelle Oyster, MD, [facility] Physical Medicine & Rehabilitation  10/18/2012

## 2012-10-18 NOTE — PMR Pre-admission (Signed)
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 Date: 02/16/12     Deduct: none      Out of Pocket Max: $3400      Life Max: none CIR: $170 per day days 1 -7      SNF: no copay days 1 - 10, $50 copay days 11-100 Outpatient: $35 per visit     Co-Pay: no visit limit Home Health: 100%      Co-Pay: no visit limit DME: 80%     Co-Pay: 20% Providers: in network  SECONDARY: Hospice home       Policy#: [id number]      Subscriber: pt I notified Hospice RN Elijah Birk of pt's admission to [facility] [phone]  [employer] Contact Information Contact Information   Name Relation Home Work Colby Daughter [phone]  [phone]   Stover,Carol Daughter [phone]       Current Medical History  Patient Admitting Diagnosis: embolic bicerebral infarcts  History of Present IllnessLucinda RAIMA Olsen is a 79 y.o. right-handed female with history of metastatic adenocarcinoma diagnosed 2012.previous chemotherapy treatments but stopped due to progression of her cancer, malignant ascites, diabetes mellitus peripheral neuropathy. Patient independent with rolling walker prior to admission. Admitted 10/13/2012 after a fall with noted slurred speech and left-sided weakness. MRI of the brain showed scattered small acute posterior right MCA territory infarcts. Several tiny acute infarcts also in the posterior left MCA territory. MRA of the head negative. Echocardiogram with ejection fraction of 55% normal  systolic function. Carotid Dopplers with no ICA stenosis. Await plan for possible TEE. Patient did not receive TPA. Neurology services consulted placed on aspirin therapy for CVA prophylaxis. Noted chronic anemia hemoglobin 6.8 patient has been transfused with latest hemoglobin 10.1. Patient is being followed by hospice care for history of progressive metastatic adenocarcinoma. She does undergo weekly therapeutic paracentesis for chronic ascites and followed by oncology services Dr. Arnoldo Lenis.    Total: 1  NIH     Past Medical History  Past Medical History  Diagnosis Date  . Cataract   . Diabetes mellitus   . Cancer     Family History  family history is not on file.  Prior Rehab/Hospitalizations: none  Current Medications  Current facility-administered medications:antiseptic oral rinse (BIOTENE) solution 15 mL, 15 mL, Mouth Rinse, BID, Laveda Norman, MD, 15 mL at 10/18/12 0800;  aspirin tablet 325 mg, 325 mg, Oral, Daily, Laveda Norman, MD, 325 mg at 10/18/12 1002;  buPROPion [facility]) 12 hr tablet 100 mg, 100 mg, Oral, Daily, Esperanza Sheets, MD, 100 mg at 10/18/12 1002 carvedilol (COREG) tablet 3.125 mg, 3.125 mg, Oral, BID WC, Esperanza Sheets, MD, 3.125 mg at 10/18/12 1002;  fentaNYL (DURAGESIC - dosed mcg/hr) patch  25 mcg, 25 mcg, Transdermal, Q72H, Marden Noble, MD, 25 mcg at 10/17/12 0949;  lidocaine (LIDODERM) 5 % 1 patch, 1 patch, Transdermal, Q24H, Marden Noble, MD, 1 patch at 10/18/12 1003;  ondansetron [facility]) injection 4 mg, 4 mg, Intravenous, Q6H PRN, Jinger Neighbors, NP, 4 mg at 10/13/12 2100 ondansetron (ZOFRAN) tablet 4 mg, 4 mg, Oral, Q8H PRN, Marden Noble, MD, 4 mg at 10/18/12 0814;  polyethylene glycol (MIRALAX / GLYCOLAX) packet 17 g, 17 g, Oral, Daily, Esperanza Sheets, MD, 17 g at 10/18/12 1003;  potassium chloride (K-DUR,KLOR-CON) CR tablet 10 mEq, 10 mEq, Oral, Daily, Esperanza Sheets, MD, 10 mEq at 10/18/12 1002;  RESOURCE THICKENUP CLEAR, , Oral, PRN, Marden Noble, MD traMADol Janean Sark) tablet 100 mg, 100 mg, Oral, Q6H PRN, Esperanza Sheets, MD, 100 mg at 10/16/12 1148;  venlafaxine XR (EFFEXOR-XR) 24 hr capsule 75 mg, 75 mg, Oral, Q breakfast, Esperanza Sheets, MD, 75 mg at 10/18/12 1002  Patients Current Diet: Dysphagia 2 diet with nectar thick liquids  Precautions / Restrictions Precautions Precautions: Fall Precaution Comments: L inattention, L leg weakness.   Restrictions Weight Bearing Restrictions: No   Prior Activity Level Limited Community (1-2x/wk): yes  Home Assistive Devices / Equipment Home Assistive Devices/Equipment: BIPAP;Walker (specify type);CBG Meter Home Equipment: Walker - 2 wheels;Shower seat;Walker - 4 wheels;Bedside commode  Prior Functional Level Prior Function Level of Independence: Independent with assistive device(s) Gait / Transfers Assistance Needed: ambulates with Rollator ADL's / Homemaking Assistance Needed: assist for bathing 2x/wk and with items dressing like her bra Comments: Husband bedridden and has 24/7 caregivers. They cook for both  Current Functional Level Cognition  Arousal/Alertness: Awake/alert Overall Cognitive Status: Impaired/Different from baseline Current Attention Level: Sustained Orientation Level: Oriented X4 Following Commands: Follows one step commands consistently Safety/Judgement: Decreased awareness of safety;Decreased awareness of deficits General Comments: decreased attention L UE/side Attention: Sustained Sustained Attention: Impaired Sustained Attention Impairment: Verbal basic Memory: Impaired Memory Impairment: Prospective memory Awareness: Appears intact Problem Solving: Appears intact Executive Function:  (deficits in organizing) Safety/Judgment:  (TBA)    Extremity Assessment (includes Sensation/Coordination)          ADLs  Eating/Feeding: NPO Grooming: Wash/dry hands;Wash/dry face;Min guard Where Assessed - Grooming: Supported standing Upper Body  Bathing: Moderate assistance Where Assessed - Upper Body Bathing: Supported sitting Lower Body Bathing: Maximal assistance Where Assessed - Lower Body Bathing: Supported sit to stand Upper Body Dressing: Maximal assistance Where Assessed - Upper Body Dressing: Supported sitting Lower Body Dressing: +1 Total assistance Where Assessed - Lower Body Dressing: Supported sit to Pharmacist, hospital: Performed;Minimal Dentist: Patient Percentage: 50% Statistician Method: Sit to Barista: Raised toilet seat with arms (or 3-in-1 over toilet);Regular height toilet;Grab bars Toileting - Clothing Manipulation and Hygiene: Performed;Minimal assistance Where Assessed - Glass blower/designer Manipulation and Hygiene: Standing Tub/Shower Transfer Method: Not assessed Equipment Used: Rolling walker;Gait belt;Other (comment) (3 in 1 over toilet) Transfers/Ambulation Related to ADLs: cues for safety, attention to L side ADL Comments: demonstrates L bias during ADL. Unable to maintain midline posture when distracted. apparent L inattention    Mobility  Bed Mobility: Not assessed Supine to Sit: 4: Min assist;With rails;HOB elevated Sitting - Scoot to Edge of Bed: 4: Min assist;With rail    Transfers  Transfers: Stand to [employer];Sit to Stand Sit to Stand: 4: Min assist;With upper extremity assist;With armrests;From chair/3-in-1;From toilet Stand to Sit: 4: Min assist;With upper extremity assist;To chair/3-in-1;To toilet  Ambulation / Gait / Stairs / Wheelchair Mobility  Ambulation/Gait Ambulation/Gait Assistance: 4: Min assist Ambulation/Gait: Patient Percentage: 50% Ambulation Distance (Feet): 60 Feet (30' x 2 with seated rest break) Assistive device: 4-wheeled walker Ambulation/Gait Assistance Details: min assist to support trunk for balance, increased DOE (3/4) and HR (120-145) with gait requiring seated rest break 1/2 way through.  O2 sats while walking 93%  on RA are actually better than when she sits down 87-90% on RA (likely due to compression of abdomen).   Gait Pattern: Step-to pattern;Shuffle;Decreased weight shift to right;Lateral trunk lean to left;Trunk flexed;Narrow base of support Gait velocity: less than 1.8 ft/sec putting her at risk for recurrent falls General Gait Details: HR into the 130s with short distance gait. O2 sats 92% on RA despite DOE 93%.   Stairs: No    Posture / Balance Static Sitting Balance Static Sitting - Balance Support: Bilateral upper extremity supported;Feet supported Static Sitting - Level of Assistance: 4: Min assist Static Sitting - Comment/# of Minutes: 4, cueing for midline posture due to left lean Static Standing Balance Static Standing - Balance Support: Bilateral upper extremity supported Static Standing - Level of Assistance: 4: Min assist Static Standing - Comment/# of Minutes: 2 Dynamic Standing Balance Dynamic Standing - Balance Support: Right upper extremity supported;Left upper extremity supported;During functional activity Dynamic Standing - Level of Assistance: 4: Min assist Berg Balance Test Sit to Stand: Needs moderate or maximal assist to stand Standing Unsupported: Able to stand 30 seconds unsupported Sitting with Back Unsupported but Feet Supported on Floor or Stool: Able to sit 2 minutes under supervision Stand to Sit: Needs assistance to sit Transfers: Needs one person to assist Standing Unsupported with Eyes Closed: Able to stand 10 seconds with supervision Standing Ubsupported with Feet Together: Needs help to attain position but able to stand for 30 seconds with feet together From Standing, Reach Forward with Outstretched Arm: Reaches forward but needs supervision From Standing Position, Pick up Object from Floor: Unable to try/needs assist to keep balance From Standing Position, Turn to Look Behind Over each Shoulder: Needs supervision when turning Turn 360 Degrees: Needs  assistance while turning Standing Unsupported, Alternately Place Feet on Step/Stool: Needs assistance to keep from falling or unable to try Standing Unsupported, One Foot in Front: Needs help to step but can hold 15 seconds Standing on One Leg: Unable to try or needs assist to prevent fall Total Score: 13    Special needs/care consideration Bowel mgmt: continent Bladder mgmt:continent Bipap   Previous Home Environment Living Arrangements: Spouse/significant other;Other (Comment) (spouse has 24/7 caregivers)  Lives With: Family Available Help at Discharge: Other (Comment) (spouse has caregiers for him. can hire seperate help for pt) Type of Home: House Home Layout: One level Home Access: Ramped entrance Home Care Services: Yes Type of Home Care Services: Homehealth aide;Hospice (home health aide)  Discharge Living Setting Plans for Discharge Living Setting: Patient's home;Lives with (comment);Other (Comment) (spouse and caregivers for spouse) Type of Home at Discharge: House Discharge Home Layout: One level Discharge Home Access: Ramped entrance Does the patient have any problems obtaining your medications?: No  Social/Family/Support Systems Patient Roles: Spouse;Parent Contact Information: Heide Guile, daughter Anticipated Caregiver: family and hired caregivers Anticipated Caregiver's Contact Information: [phone] Ability/Limitations of Caregiver: Mordecai Maes works NICU at [employer]: 24/7 Discharge Plan Discussed with Primary Caregiver: Yes Is Caregiver In Agreement with Plan?: Yes Does Caregiver/Family have Issues with Lodging/Transportation while Pt is in Rehab?: No  Goals/Additional Needs Patient/Family  Goal for Rehab: Mod I to supervision with PT, OT, and SLP Expected length of stay: [facility] 7 days Dietary Needs: Dysphagia 2 diet with nectar thick liquids Special Service Needs: Patient has ascites and has paracentesis weekly on Fridays.  Hospice Home Helath pta Pt/Family Agrees to Admission and willing to participate: Yes Program Orientation Provided & Reviewed with Pt/Caregiver Including Roles  & Responsibilities: Yes   Decrease burden of Care through IP rehab admission: n/a  Possible need for SNF placement upon discharge:not anticipated   Patient Condition: This patient's condition remains as documented in the consult dated 10/17/12, in which the Rehabilitation Physician determined and documented that the patient's condition is appropriate for intensive rehabilitative care in an inpatient rehabilitation facility. Will admit to inpatient rehab today.  Preadmission Screen Completed By:  Clois Dupes, 10/18/2012 2:14 PM ______________________________________________________________________   Discussed status with Dr. Riley Kill on 10/18/12 at  1413 and received telephone approval for admission today.  Admission Coordinator:  Clois Dupes, time 4098 Date 10/18/12.

## 2012-10-18 NOTE — Progress Notes (Signed)
Pt sitting up in the chair when arrived.  Daughter and Son-in-Law present.  Pt drowsy.  She reports having had a busy morning.  Diet has been changed to include nectar thick liquids from thin liquids.  Daughter reported that the pt has been coughing much less since the change.  Caregiver had several questions about hospice benefit and rehab.  Writer answered questions.  There is some confusion over transitioning to inpatient rehab.  Writer has left a message for Elijio Miles MSW at [facility] to clarify.  Plan is for the pt to transfer this afternoon to the 4th floor rehab unit.  Bed number unknown at this time.    Reviewed chart.  Pt making progress with her PT/OT.   Spoke with Dr Jamie Brookes yesterday afternoon.  Per MD this is a hospice related admission. Please contact [facility] with pt movement at [phone].  Elijah Birk RN, [facility] RN, [facility] [phone]

## 2012-10-18 NOTE — H&P (Signed)
Physical Medicine and Rehabilitation Admission H&P    Chief Complaint  Patient presents with  . Code Stroke  : HPI: [name] is a 79 y.o. right-handed female with history of metastatic adenocarcinoma diagnosed 2012.previous chemotherapy treatments but stopped due to progression of her cancer, malignant ascites, diabetes mellitus peripheral neuropathy. Patient independent with rolling walker prior to admission. Admitted 10/13/2012 after a fall with noted slurred speech and left-sided weakness. MRI of the brain showed scattered small acute posterior right MCA territory infarcts. Several tiny acute infarcts also in the posterior left MCA territory. MRA of the head negative. Echocardiogram with ejection fraction of 55% normal systolic function. Carotid Dopplers with no ICA stenosis.  Patient did not receive TPA. Neurology services consulted placed on aspirin therapy for CVA prophylaxis. Speech therapy for dysphagia currently maintained on a dysphagia 2 thin liquid diet. Noted chronic anemia hemoglobin 6.8 patient has been transfused with latest hemoglobin 10.1. Patient is being followed by hospice care for history of progressive metastatic adenocarcinoma. She does undergo weekly therapeutic paracentesis for chronic ascites and followed by oncology services Dr. Brad Sherill. Physical and occupational therapy evaluations have been completed with recommendations of physical medicine rehabilitation consult to consider inpatient rehabilitation services. Patient was felt to be a good candidate for inpatient rehabilitation services and was admitted for comprehensive rehabilitation program   Review of Systems  Gastrointestinal:  Abdominal ascites  Musculoskeletal: Positive for back pain.  Neurological: Positive for weakness.  Psychiatric/Behavioral: Positive for depression.  All other systems reviewed and are negative  Past Medical History  Diagnosis Date  . Cataract   . Diabetes mellitus   .  Cancer    Past Surgical History  Procedure Laterality Date  . Bladder surgery  06/1989  . Knee surgery  10/30/1993    Lt   . Back surgery  20/11/1998    ruptured disc  . Colon surgery    . Hernia repair     History reviewed. No pertinent family history. Social History:  reports that she has never smoked. She has never used smokeless tobacco. She reports that she does not drink alcohol. Her drug history is not on file. Allergies:  Allergies  Allergen Reactions  . Oxycodone Other (See Comments)    hallucinations  . Ace Inhibitors Other (See Comments)    Reaction unknown  . Ciprofloxacin Hcl Other (See Comments)    Reaction unknown  . Hydrocodone-Acetaminophen Other (See Comments)    Reports made her "crazy"   Medications Prior to Admission  Medication Sig Dispense Refill  . buPROPion (WELLBUTRIN SR) 100 MG 12 hr tablet Take 100 mg by mouth daily.       . carvedilol (COREG) 3.125 MG tablet Take 3.125 mg by mouth 2 (two) times daily with a meal.        . KLOR-CON M20 20 MEQ tablet Take 10 mEq by mouth daily.       . polyethylene glycol (MIRALAX / GLYCOLAX) packet Take 17 g by mouth daily.      . senna (SENOKOT) 8.6 MG tablet Take 1 tablet by mouth 2 (two) times daily.       . traMADol (ULTRAM) 50 MG tablet Take 100 mg by mouth every 6 (six) hours as needed for pain.      . venlafaxine (EFFEXOR) 75 MG tablet Take 75 mg by mouth daily.       . [DISCONTINUED] aspirin 81 MG tablet Take 81 mg by mouth daily.        . [  DISCONTINUED] furosemide (LASIX) 20 MG tablet Take 40 mg by mouth daily.       . [DISCONTINUED] lisinopril (PRINIVIL,ZESTRIL) 5 MG tablet Take 5 mg by mouth daily.        . [DISCONTINUED] meloxicam (MOBIC) 15 MG tablet Take 15 mg by mouth daily.       . [DISCONTINUED] metFORMIN (GLUCOPHAGE) 500 MG tablet Take 500 mg by mouth 2 (two) times daily.       . [DISCONTINUED] ondansetron (ZOFRAN) 8 MG tablet Take 1 tablet (8 mg total) by mouth every 12 (twelve) hours as needed for  nausea.  60 tablet  1  . [DISCONTINUED] prochlorperazine (COMPAZINE) 5 MG tablet Take 1 tablet (5 mg total) by mouth every 6 (six) hours as needed for nausea.  30 tablet  0    Home: Home Living Family/patient expects to be discharged to:: Inpatient rehab Living Arrangements: Spouse/significant other Available Help at Discharge: Personal care attendant;Available 24 hours/day Type of Home: House Home Access: Ramped entrance Home Layout: One level Home Equipment: Walker - 2 wheels;Shower seat;Walker - 4 wheels;Bedside commode  Lives With: Family   Functional History: Prior Function Vocation: Retired  Functional Status:  Mobility: Bed Mobility Bed Mobility: Not assessed Supine to Sit: 4: Min assist;With rails;HOB elevated Sitting - Scoot to Edge of Bed: 4: Min assist;With rail Transfers Transfers: Stand to Sit;Sit to Stand Sit to Stand: 4: Min assist;With upper extremity assist;With armrests;From chair/3-in-1;From toilet Stand to Sit: 4: Min assist;With upper extremity assist;To chair/3-in-1;To toilet Ambulation/Gait Ambulation/Gait Assistance: 4: Min assist Ambulation/Gait: Patient Percentage: 50% Ambulation Distance (Feet): 60 Feet (30' x 2 with seated rest break) Assistive device: 4-wheeled walker Ambulation/Gait Assistance Details: min assist to support trunk for balance, increased DOE (3/4) and HR (120-145) with gait requiring seated rest break 1/2 way through.  O2 sats while walking 93% on RA are actually better than when she sits down 87-90% on RA (likely due to compression of abdomen).   Gait Pattern: Step-to pattern;Shuffle;Decreased weight shift to right;Lateral trunk lean to left;Trunk flexed;Narrow base of support Gait velocity: less than 1.8 ft/sec putting her at risk for recurrent falls General Gait Details: HR into the 130s with short distance gait. O2 sats 92% on RA despite DOE 93%.   Stairs: No    ADL: ADL Eating/Feeding: NPO Grooming: Wash/dry hands;Wash/dry  face;Min guard Where Assessed - Grooming: Supported standing Upper Body Bathing: Moderate assistance Where Assessed - Upper Body Bathing: Supported sitting Lower Body Bathing: Maximal assistance Where Assessed - Lower Body Bathing: Supported sit to stand Upper Body Dressing: Maximal assistance Where Assessed - Upper Body Dressing: Supported sitting Lower Body Dressing: +1 Total assistance Where Assessed - Lower Body Dressing: Supported sit to stand Toilet Transfer: Performed;Minimal assistance Toilet Transfer Method: Sit to stand Toilet Transfer Equipment: Raised toilet seat with arms (or 3-in-1 over toilet);Regular height toilet;Grab bars Tub/Shower Transfer Method: Not assessed Equipment Used: Rolling walker;Gait belt;Other (comment) (3 in 1 over toilet) Transfers/Ambulation Related to ADLs: cues for safety, attention to L side ADL Comments: demonstrates L bias during ADL. Unable to maintain midline posture when distracted. apparent L inattention  Cognition: Cognition Overall Cognitive Status: Impaired/Different from baseline Arousal/Alertness: Awake/alert Orientation Level: Oriented X4 Attention: Sustained Sustained Attention: Impaired Sustained Attention Impairment: Verbal basic Memory: Impaired Memory Impairment: Prospective memory Awareness: Appears intact Problem Solving: Appears intact Executive Function:  (deficits in organizing) Safety/Judgment:  (TBA) Cognition Arousal/Alertness: Awake/alert Behavior During Therapy: WFL for tasks assessed/performed Overall Cognitive Status: Impaired/Different from baseline Area of Impairment:   Attention;Memory;Following commands;Safety/judgement;Awareness;Problem solving Current Attention Level: Sustained Memory: Decreased short-term memory Following Commands: Follows one step commands consistently Safety/Judgement: Decreased awareness of safety;Decreased awareness of deficits Awareness: Emergent Problem Solving: Slow  processing;Difficulty sequencing;Requires verbal cues General Comments: decreased attention L UE/side  Physical Exam: Blood pressure 126/69, pulse 134, temperature 98.6 F (37 C), temperature source Oral, resp. rate 20, height 5' 1" (1.549 m), weight 83.008 kg (183 lb), SpO2 89.00%. Constitutional: She is oriented to person, place, and time. Sitting comfortably in her chair  HENT: oral mucosa pink and moist Head: Normocephalic.  Eyes: EOM are normal.  Neck: Normal range of motion. Neck supple. No thyromegaly present.  Cardiovascular: Normal rate and regular rhythm. No murmurs, rubs, gallops Pulmonary/Chest:  Decreased breath sounds at the bases. No wheezes rales or rhonchi Abdominal: Soft. She exhibits mild distension. There is no tenderness. Bowel sounds positive Neurological: She is alert and oriented to person, place, and time. CN exam non-focal. Intact cough Follows commands. Alert and generally appropriate LUE 4/5. LLE 2/5 HF, 3/5 KE, 3+ to 4 with ADF and APF on left. RUE and RLE 4+ to 5/5. No gross sensory impairments. Decreased FMC on left more than right.   Skin: Skin is warm and dry.  Psychiatric: She has a normal mood and affect. Her behavior is normal. Thought content normal   Results for orders placed during the hospital encounter of 10/13/12 (from the past 48 hour(s))  GLUCOSE, CAPILLARY     Status: Abnormal   Collection Time    10/16/12  3:42 PM      Result Value Range   Glucose-Capillary 143 (*) 70 - 99 mg/dL  GLUCOSE, CAPILLARY     Status: Abnormal   Collection Time    10/16/12  8:13 PM      Result Value Range   Glucose-Capillary 135 (*) 70 - 99 mg/dL   Comment 1 Notify RN    GLUCOSE, CAPILLARY     Status: None   Collection Time    10/17/12  7:36 AM      Result Value Range   Glucose-Capillary 94  70 - 99 mg/dL  GLUCOSE, CAPILLARY     Status: Abnormal   Collection Time    10/17/12 11:02 AM      Result Value Range   Glucose-Capillary 150 (*) 70 - 99 mg/dL    Comment 1 Notify RN    GLUCOSE, CAPILLARY     Status: Abnormal   Collection Time    10/17/12  4:26 PM      Result Value Range   Glucose-Capillary 155 (*) 70 - 99 mg/dL   Comment 1 Notify RN    GLUCOSE, CAPILLARY     Status: Abnormal   Collection Time    10/17/12  9:24 PM      Result Value Range   Glucose-Capillary 171 (*) 70 - 99 mg/dL  GLUCOSE, CAPILLARY     Status: Abnormal   Collection Time    10/18/12  7:59 AM      Result Value Range   Glucose-Capillary 118 (*) 70 - 99 mg/dL   Comment 1 Documented in Chart     Comment 2 Notify RN    GLUCOSE, CAPILLARY     Status: Abnormal   Collection Time    10/18/12 11:53 AM      Result Value Range   Glucose-Capillary 164 (*) 70 - 99 mg/dL   Comment 1 Documented in Chart     Comment 2 Notify RN       No results found.  Post Admission Physician Evaluation: 1. Functional deficits secondary  to embolic bicerebral infarct. 2. Patient is admitted to receive collaborative, interdisciplinary care between the physiatrist, rehab nursing staff, and therapy team. 3. Patient's level of medical complexity and substantial therapy needs in context of that medical necessity cannot be provided at a lesser intensity of care such as a SNF. 4. Patient has experienced substantial functional loss from his/her baseline which was documented above under the "Functional History" and "Functional Status" headings.  Judging by the patient's diagnosis, physical exam, and functional history, the patient has potential for functional progress which will result in measurable gains while on inpatient rehab.  These gains will be of substantial and practical use upon discharge  in facilitating mobility and self-care at the household level. 5. Physiatrist will provide 24 hour management of medical needs as well as oversight of the therapy plan/treatment and provide guidance as appropriate regarding the interaction of the two. 6. 24 hour rehab nursing will assist with bladder  management, bowel management, safety, skin/wound care, disease management, medication administration, pain management and patient education  and help integrate therapy concepts, techniques,education, etc. 7. PT will assess and treat for/with: Lower extremity strength, range of motion, stamina, balance, functional mobility, safety, adaptive techniques and equipment, NMR, cognitive perceptual awareness.   Goals are: mod I to supervision. 8. OT will assess and treat for/with: ADL's, functional mobility, safety, upper extremity strength, adaptive techniques and equipment, NMR, cognitive perceptual awareness, education.   Goals are: mod I to supervision. 9. SLP will assess and treat for/with: cognition, swallowing.  Goals are: mod I. 10. Case Management and Social Worker will assess and treat for psychological issues and discharge planning. 11. Team conference will be held weekly to assess progress toward goals and to determine barriers to discharge. 12. Patient will receive at least 3 hours of therapy per day at least 5 days per week. 13. ELOS: 7 days       14. Prognosis:  excellent   Medical Problem List and Plan: 1. Embolic bi cerebral infarcts 2. DVT Prophylaxis/Anticoagulation: SCDs. Monitor for any signs of DVT 3. Pain Management: Duragesic patch 25 mcg change every 72 hours, Lidoderm patch, Ultram as needed. Monitor with increased activity 4. Mood: Wellbutrin 100 mg daily, Effexor 75 mg daily. Provide emotional support 5. Neuropsych: This patient is capable of making decisions on her own behalf. 6. Metastatic adenocarcinoma with malignant ascites. Followup oncology services. Patient receives weekly paracentesis for malignant ascites per interventional radiology with last paracentesis 10/13/2012---follow up with them this week. 7. Dysphagia. Dysphagia 2 nectar liquids. Followup speech therapy. Monitor for any aspiration 8. Chronic anemia. Patient has been transfused. Check Hemoccults stools.  Followup CBC 9. Hypertension. Coreg 3.125 mg twice a day. Monitor with increased activity 10. Obstructive sleep apnea. Continue CPAP 11. Diabetes mellitus with peripheral neuropathy. She is presently off her Glucophage 500 mg twice a day as prior to admission. Hemoglobin A1c 6.2. Check blood sugars a.c. and at bedtime   Zachary T. Swartz, MD, FAAPMR [facility]  10/18/2012 

## 2012-10-18 NOTE — Interval H&P Note (Signed)
[name] was admitted today to Inpatient Rehabilitation with the diagnosis of embolic cva.  The patient's history has been reviewed, patient examined, and there is no change in status.  Patient continues to be appropriate for intensive inpatient rehabilitation.  I have reviewed the patient's chart and labs.  Questions were answered to the patient's satisfaction.  Analeise Mccleery T 10/18/2012, 10:58 PM

## 2012-10-18 NOTE — Progress Notes (Signed)
Physical Therapy Treatment Patient Details Name: [name] MRN: [id number] DOB: [birth year] Today's Date: 10/18/2012 Time: 1478-2956 [name] Time Calculation (min): 43 min  [name] Assessment / Plan / Recommendation  History of Present Illness [name] an 79 y.o. female who awakened early morning of 8/29. Was normal at that time. Later this morning when she got up to use the bathroom she had difficulty getting her pants down. She began to feel poorly and then fell. When her aide found her she was on the floor she had a left facial droop and was weak on the left side. EMS was called and the patient was brought in as a code stroke. Initial NIHSS of 8.  Brain MRI revealed scattered small acute posterior right MCA territory infarcts without mass effect, punctate petechial hemorrhage in the right parietal lobe, as well as several tiny acute infarcts also in the posterior left MCA territory and also the right cerebellum.   [name] Comments   [name] continues to progress, slowly, but steadily when working with [name].  She reports right shoulder pain today (OA) and has a pain patch on it to help.  HR and DOE/O2 sats a little worse today requiring frequent rest breaks for activity tolerance.  HR 120-145 during therapy, DOE with gait 3/4, and O2 sats 93% with gait, sitting and talking was 87-92% on RA.  [name] reports it is close to time for her to get "tapped" by which she means have the fluid drained from her chest and this may be affecting her lung capacity/DOE/O2 sats.  She continues to have significant balance deficits (as demonstrated by her Sharlene Motts Score today) which is worrisome as she does have some self care tasks that require her to remover her hands from her walker (turning around to sit on her rollator, toileting, washing her hands at the sink).  In standing with hands unsupported she loses her balance to her weaker left side.  There were also two instances of inattention of the left hand during our session today (her hand  got wrapped in her telemetry wires and she needed cues to correct, and her left hand was caught in the bathroom and she needed cues to make sure the hand was protected).  She was able to tolerate a full 43 min session and this is after working with SLP.  OT came in to see her directly after me as well, so this will be a good test of if she can tolerate the intensity of inpatient rehab level therapies.  I continue to believe she is an excellent candidate with potential to get to supervision level of care.    Follow Up Recommendations  CIR     Does the patient have the potential to tolerate intense rehabilitation    Yes, as demonstrated today.    Barriers to Discharge  None      Equipment Recommendations  None recommended by [name]    Recommendations for Other Services   None  Frequency Min 4X/week   Progress towards [name] Goals Progress towards [name] goals: Progressing toward goals  Plan Current plan remains appropriate    Precautions / Restrictions Precautions Precautions: Fall Precaution Comments: L inattention, L leg weakness.   Restrictions Weight Bearing Restrictions: No   Pertinent Vitals/Pain HR ranged 120-145 during activity today, DOE 2-3/4 with gait.  O2 sats 87-92% on RA (with worst O2 sats sitting and talking).      Mobility  Bed Mobility Bed Mobility: Not assessed ([name] seated in recliner when  [name] entered the room) Transfers Transfers: Stand to Sit;Sit to Stand Sit to Stand: 4: Min assist;3: Mod assist;With upper extremity assist;With armrests;From bed;From chair/3-in-1 Stand to Sit: 4: Min assist;With upper extremity assist;To chair/3-in-1 Details for Transfer Assistance: min assist to support trunk to help [name] stand from higher surfaces like the 3-in- 1 in the bathroom.  Mod assist and momentum required to get up from low recliner chair and verbal cues for hand placment and sequencing of transfer (i.e. scoot to the edge first).  Min assist to assist in lowering with controlled  descent.  When [name] goes from sit to stand with rollator, she needs cues to not unlock the breaks prematurely.   Ambulation/Gait Ambulation/Gait Assistance: 4: Min assist Ambulation Distance (Feet): 60 Feet (30' x 2 with seated rest break) Assistive device: 4-wheeled walker Ambulation/Gait Assistance Details: min assist to support trunk for balance, increased DOE (3/4) and HR (120-145) with gait requiring seated rest break 1/2 way through.  O2 sats while walking 93% on RA are actually better than when she sits down 87-90% on RA (likely due to compression of abdomen).   Gait Pattern: Step-to pattern;Shuffle;Decreased weight shift to right;Lateral trunk lean to left;Trunk flexed;Narrow base of support Gait velocity: less than 1.8 ft/sec putting her at risk for recurrent falls Modified Rankin (Stroke Patients Only) Pre-Morbid Rankin Score: Moderately severe disability Modified Rankin: Moderately severe disability      [name] Goals (current goals can now be found in the care plan section) Acute Rehab [name] Goals Patient Stated Goal: to go home, family wants her to get a little stronger and more independent before returning home.    Visit Information  Last [name] Received On: 10/18/12 Assistance Needed: +1 History of Present Illness: [name] an 79 y.o. female who awakened early morning of 8/29. Was normal at that time. Later this morning when she got up to use the bathroom she had difficulty getting her pants down. She began to feel poorly and then fell. When her aide found her she was on the floor she had a left facial droop and was weak on the left side. EMS was called and the patient was brought in as a code stroke. Initial NIHSS of 8.  Brain MRI revealed scattered small acute posterior right MCA territory infarcts without mass effect, punctate petechial hemorrhage in the right parietal lobe, as well as several tiny acute infarcts also in the posterior left MCA territory and also the right cerebellum.     Subjective Data  Subjective: [name] awake, just finished working with SLP.  Daughter present at beginning of session.   Patient Stated Goal: to go home, family wants her to get a little stronger and more independent before returning home.     Cognition  Cognition Arousal/Alertness: Awake/alert Behavior During Therapy: WFL for tasks assessed/performed Overall Cognitive Status: Impaired/Different from baseline Area of Impairment: Attention;Memory;Following commands;Safety/judgement;Awareness;Problem solving Current Attention Level: Sustained Memory: Decreased short-term memory Following Commands: Follows one step commands consistently (needs repetition and cueing for multi step commands) Safety/Judgement: Decreased awareness of safety;Decreased awareness of deficits Awareness: Emergent Problem Solving: Slow processing;Difficulty sequencing;Requires verbal cues    Balance  Balance Balance Assessed: Yes Standardized Balance Assessment Standardized Balance Assessment: Berg Balance Test Berg Balance Test Sit to Stand: Needs moderate or maximal assist to stand Standing Unsupported: Able to stand 30 seconds unsupported Sitting with Back Unsupported but Feet Supported on Floor or Stool: Able to sit 2 minutes under supervision Stand to Sit: Needs assistance to sit Transfers:  Needs one person to assist Standing Unsupported with Eyes Closed: Able to stand 10 seconds with supervision Standing Ubsupported with Feet Together: Needs help to attain position but able to stand for 30 seconds with feet together From Standing, Reach Forward with Outstretched Arm: Reaches forward but needs supervision From Standing Position, Pick up Object from Floor: Unable to try/needs assist to keep balance From Standing Position, Turn to Look Behind Over each Shoulder: Needs supervision when turning Turn 360 Degrees: Needs assistance while turning Standing Unsupported, Alternately Place Feet on Step/Stool: Needs  assistance to keep from falling or unable to try Standing Unsupported, One Foot in Front: Needs help to step but can hold 15 seconds Standing on One Leg: Unable to try or needs assist to prevent fall Total Score: 13  End of Session [name] - End of Session Activity Tolerance: Patient limited by fatigue;Patient limited by pain Patient left: in chair;with call bell/phone within reach (OT coming in to work with [name])    Lurena Joiner B. Maxtyn Nuzum, [name], DPT [phone]   10/18/2012, 11:30 AM

## 2012-10-18 NOTE — Progress Notes (Signed)
[facility] per MD order. Pt and family received and reviewed all discharge instructions and medication information including follow-up appointments and prescription information.  Pt and family also received stroke education prior to discharge. Pt and family verbalized understanding. Pt alert and oriented at discharge with no complaints of pain. Pt transferred to 4W25 via wheelchair by nurse tech and RN. All personal belongings sent with pt. Joylene Grapes

## 2012-10-18 NOTE — Progress Notes (Signed)
Discussed with Dr. Riley Kill and I have insurance approval to admit pt to inpt rehab today. I will arrange. [phone]

## 2012-10-18 NOTE — Progress Notes (Signed)
Occupational Therapy Treatment Patient Details Name: [name] MRN: [id number] DOB: Feb 11, 1923 Today's Date: 10/18/2012 Time: 1191-4782 OT Time Calculation (min): 32 min  OT Assessment / Plan / Recommendation  History of present illness [name] an 79 y.o. female who awakened early morning of 8/29. Was normal at that time. Later this morning when she got up to use the bathroom she had difficulty getting her pants down. She began to feel poorly and then fell. When her aide found her she was on the floor she had a left facial droop and was weak on the left side. EMS was called and the patient was brought in as a code stroke. Initial NIHSS of 8.  Brain MRI revealed scattered small acute posterior right MCA territory infarcts without mass effect, punctate petechial hemorrhage in the right parietal lobe, as well as several tiny acute infarcts also in the posterior left MCA territory and also the right cerebellum.   OT comments  Pt making progress with functional goals, continues to demo decreased attention to L side with cues to initiate activities and correct positioning/technique for ADL mobilioty  Follow Up Recommendations  CIR    Barriers to Discharge       Equipment Recommendations  None recommended by OT    Recommendations for Other Services    Frequency Min 3X/week   Progress towards OT Goals Progress towards OT goals: Progressing toward goals  Plan Discharge plan remains appropriate    Precautions / Restrictions Precautions Precautions: Fall Precaution Comments: L inattention, L leg weakness.   Restrictions Weight Bearing Restrictions: No   Pertinent Vitals/Pain No c/o    ADL  Grooming: Wash/dry hands;Wash/dry face;Min guard Where Assessed - Grooming: Supported standing Toilet Transfer: Performed;Minimal Dentist Method: Sit to Barista: Raised toilet seat with arms (or 3-in-1 over toilet);Regular height toilet;Grab  bars Toileting - Clothing Manipulation and Hygiene: Performed;Minimal assistance Where Assessed - Glass blower/designer Manipulation and Hygiene: Standing Tub/Shower Transfer Method: Not assessed Equipment Used: Rolling walker;Gait belt;Other (comment) (3 in 1 over toilet) Transfers/Ambulation Related to ADLs: cues for safety, attention to L side ADL Comments: demonstrates L bias during ADL. Unable to maintain midline posture when distracted. apparent L inattention    OT Diagnosis:    OT Problem List:   OT Treatment Interventions:     OT Goals(current goals can now be found in the care plan section) Acute Rehab OT Goals Patient Stated Goal: to go home, family wants her to get a little stronger and more independent before returning home.    Visit Information  Last OT Received On: 10/18/12 Assistance Needed: +1 History of Present Illness: [name] an 79 y.o. female who awakened early morning of 8/29. Was normal at that time. Later this morning when she got up to use the bathroom she had difficulty getting her pants down. She began to feel poorly and then fell. When her aide found her she was on the floor she had a left facial droop and was weak on the left side. EMS was called and the patient was brought in as a code stroke. Initial NIHSS of 8.  Brain MRI revealed scattered small acute posterior right MCA territory infarcts without mass effect, punctate petechial hemorrhage in the right parietal lobe, as well as several tiny acute infarcts also in the posterior left MCA territory and also the right cerebellum.    Subjective Data      Prior Functioning       Cognition  Cognition  Arousal/Alertness: Awake/alert Behavior During Therapy: WFL for tasks assessed/performed Overall Cognitive Status: Impaired/Different from baseline Area of Impairment: Attention;Memory;Following commands;Safety/judgement;Awareness;Problem solving Current Attention Level: Sustained Memory: Decreased short-term  memory Following Commands: Follows one step commands consistently Safety/Judgement: Decreased awareness of safety;Decreased awareness of deficits Awareness: Emergent Problem Solving: Slow processing;Difficulty sequencing;Requires verbal cues General Comments: decreased attention L UE/side    Mobility  Bed Mobility Bed Mobility: Not assessed Transfers Transfers: Sit to Stand;Stand to Sit Sit to Stand: 4: Min assist;With upper extremity assist;With armrests;From chair/3-in-1;From toilet Stand to Sit: 4: Min assist;With upper extremity assist;To chair/3-in-1;To toilet Details for Transfer Assistance: pt aware that she needs to lock brakes on 4W walker, however needed verbal cues for correct hand placement and position of LEs for stand - sit  transitions    Exercises  Other Exercises Other Exercises: L side attention activities at tabletop with min verbal cues to initiate, LB ADLs with instructions for initiating with L side first   Balance Balance Balance Assessed: Yes Dynamic Standing Balance Dynamic Standing - Balance Support: Right upper extremity supported;Left upper extremity supported;During functional activity Dynamic Standing - Level of Assistance: 4: Min assist     End of Session OT - End of Session Equipment Utilized During Treatment: Gait belt;Rolling walker;Other (comment) (3 in 1 over toilet) Activity Tolerance: Patient tolerated treatment well Patient left: in chair;with call bell/phone within reach;with family/visitor present  GO     Galen Manila 10/18/2012, 11:56 AM

## 2012-10-18 NOTE — Progress Notes (Signed)
[facility])  Hospice Homecare Chaplain  Patient (pt): [name]  Room: [phone]  Hospice homecare chaplain visited to assess for spiritual needs.  Chaplain met dtr who was at bedside.  Pt was about to receive physical therapy and so chaplain made the visit brief.  Pt's dtr indicated their plans were in flux at this time and today would be pivotal for them.  Chaplain assured patient of ongoing prayer.  Chaplain provided pastoral presence, and assurance of prayer with spoken blessing.  Chaplain will continue to provide ongoing spiritual support as long as pt remains under hospice care.    Beverly Isley-Landreth ThM, BCCC [facility] Clinical Chaplain

## 2012-10-19 ENCOUNTER — Inpatient Hospital Stay (HOSPITAL_COMMUNITY): Admitting: Rehabilitation

## 2012-10-19 ENCOUNTER — Inpatient Hospital Stay (HOSPITAL_COMMUNITY): Admitting: Occupational Therapy

## 2012-10-19 ENCOUNTER — Inpatient Hospital Stay (HOSPITAL_COMMUNITY): Admitting: Speech Pathology

## 2012-10-19 DIAGNOSIS — R5381 Other malaise: Secondary | ICD-10-CM

## 2012-10-19 DIAGNOSIS — G893 Neoplasm related pain (acute) (chronic): Secondary | ICD-10-CM

## 2012-10-19 DIAGNOSIS — C785 Secondary malignant neoplasm of large intestine and rectum: Secondary | ICD-10-CM

## 2012-10-19 DIAGNOSIS — R180 Malignant ascites: Secondary | ICD-10-CM

## 2012-10-19 DIAGNOSIS — I6330 Cerebral infarction due to thrombosis of unspecified cerebral artery: Secondary | ICD-10-CM

## 2012-10-19 LAB — CBC WITH DIFFERENTIAL/PLATELET
Eosinophils Relative: 4 % (ref 0–5)
Lymphocytes Relative: 17 % (ref 12–46)
Monocytes Absolute: 1.1 10*3/uL — ABNORMAL HIGH (ref 0.1–1.0)
Monocytes Relative: 12 % (ref 3–12)
Neutrophils Relative %: 66 % (ref 43–77)
Platelets: 327 10*3/uL (ref 150–400)
RBC: 4.36 MIL/uL (ref 3.87–5.11)
WBC: 9.5 10*3/uL (ref 4.0–10.5)

## 2012-10-19 LAB — COMPREHENSIVE METABOLIC PANEL
ALT: 10 U/L (ref 0–35)
AST: 14 U/L (ref 0–37)
CO2: 31 mEq/L (ref 19–32)
Calcium: 9.2 mg/dL (ref 8.4–10.5)
Chloride: 104 mEq/L (ref 96–112)
GFR calc non Af Amer: 60 mL/min — ABNORMAL LOW (ref >90)
Sodium: 141 mEq/L (ref 135–145)

## 2012-10-19 LAB — GLUCOSE, CAPILLARY
Glucose-Capillary: 190 mg/dL — ABNORMAL HIGH (ref 70–99)
Glucose-Capillary: 147 mg/dL — ABNORMAL HIGH (ref 70–99)
Glucose-Capillary: 148 mg/dL — ABNORMAL HIGH (ref 70–99)

## 2012-10-19 MED ORDER — ONDANSETRON HCL 4 MG PO TABS
4.0000 mg | ORAL_TABLET | Freq: Three times a day (TID) | ORAL | Status: DC
  Administered 2012-10-19 – 2012-10-22 (×8): 4 mg via ORAL
  Filled 2012-10-19 (×18): qty 1

## 2012-10-19 MED ORDER — MORPHINE SULFATE (CONCENTRATE) 10 MG /0.5 ML PO SOLN
5.0000 mg | ORAL | Status: DC | PRN
  Administered 2012-10-19 – 2012-10-27 (×2): 10 mg via SUBLINGUAL
  Filled 2012-10-19 (×3): qty 0.5

## 2012-10-19 NOTE — Progress Notes (Signed)
Patient ID: [name], female   DOB: February 14, 1923, 79 y.o.   MRN: [id number] 79 y.o. right-handed female with history of metastatic adenocarcinoma diagnosed 2012.previous chemotherapy treatments but stopped due to progression of her cancer, malignant ascites, diabetes mellitus peripheral neuropathy. Patient independent with rolling walker prior to admission. Admitted 10/13/2012 after a fall with noted slurred speech and left-sided weakness. MRI of the brain showed scattered small acute posterior right MCA territory infarcts. Several tiny acute infarcts also in the posterior left MCA territory. MRA of the head negative. Echocardiogram with ejection fraction of 55% normal systolic function. Carotid Dopplers with no ICA stenosis. Patient did not receive TPA. Neurology services consulted placed on aspirin therapy for CVA prophylaxis. Speech therapy for dysphagia currently maintained on a dysphagia 2 thin liquid diet. Noted chronic anemia hemoglobin 6.8 patient has been transfused with latest hemoglobin 10.1. Patient is being followed by hospice care for history of progressive metastatic adenocarcinoma. She does undergo weekly therapeutic paracentesis for chronic ascites and followed by oncology services Dr. Arnoldo Lenis. Subjective/Complaints: No problems overnite  Objective: Vital Signs: Blood pressure 117/71, pulse 106, temperature 97.6 F (36.4 C), temperature source Axillary, resp. rate 19, height 5\' 1"  (1.549 m), weight 77.3 kg (170 lb 6.7 oz), SpO2 93.00%. No results found. Results for orders placed during the hospital encounter of 10/18/12 (from the past 72 hour(s))  GLUCOSE, CAPILLARY     Status: Abnormal   Collection Time    10/18/12  9:42 PM      Result Value Range   Glucose-Capillary 177 (*) 70 - 99 mg/dL     HEENT: normal and no drainage Cardio: RRR and no murmurs Resp: CTA B/L and unlabored GI: BS positive, mild distension Extremity:  Pulses positive and No Edema Skin:    Intact Neuro: Alert/Oriented, Flat, Normal Sensory and Normal Motor Musc/Skel:  Normal Gen NAD   Assessment/Plan: 1. Functional deficits secondary to Post R MCA infarct which require 3+ hours per day of interdisciplinary therapy in a comprehensive inpatient rehab setting. Physiatrist is providing close team supervision and 24 hour management of active medical problems listed below. Physiatrist and rehab team continue to assess barriers to discharge/monitor patient progress toward functional and medical goals. FIM:          FIM - Diplomatic Services operational officer Devices: Environmental consultant;Bedside commode Toilet Transfers: 4-To toilet/BSC: Min A (steadying Pt. > 75%);3-From toilet/BSC: Mod A (lift or lower assist)        Comprehension Comprehension Mode: Auditory Comprehension: 5-Understands basic 90% of the time/requires cueing < 10% of the time  Expression Expression Mode: Verbal Expression: 5-Expresses complex 90% of the time/cues < 10% of the time  Social Interaction Social Interaction: 5-Interacts appropriately 90% of the time - Needs monitoring or encouragement for participation or interaction.  Problem Solving Problem Solving: 5-Solves complex 90% of the time/cues < 10% of the time  Memory Memory: 5-Recognizes or recalls 90% of the time/requires cueing < 10% of the time   Medical Problem List and Plan:  1. Embolic bi cerebral infarcts  2. DVT Prophylaxis/Anticoagulation: SCDs. Monitor for any signs of DVT  3. Pain Management: Duragesic patch 25 mcg change every 72 hours, Lidoderm patch, Ultram as needed. Monitor with increased activity  4. Mood: Wellbutrin 100 mg daily, Effexor 75 mg daily. Provide emotional support  5. Neuropsych: This patient is capable of making decisions on her own behalf.  6. Metastatic adenocarcinoma with malignant ascites. Followup oncology services.  Patient receives weekly paracentesis for malignant  ascites per interventional radiology  with last paracentesis 10/13/2012---follow up with them this week.  7. Dysphagia. Dysphagia 2 nectar liquids. Followup speech therapy. Monitor for any aspiration  8. Chronic anemia. Patient has been transfused. Check Hemoccults stools. Followup CBC  9. Hypertension. Coreg 3.125 mg twice a day. Monitor with increased activity  10. Obstructive sleep apnea. Continue CPAP  11. Diabetes mellitus with peripheral neuropathy. She is presently off her Glucophage 500 mg twice a day as prior to admission. Hemoglobin A1c 6.2. Check blood sugars a.c. and at bedtime     LOS (Days) 1 A FACE TO FACE EVALUATION WAS PERFORMED  KIRSTEINS,ANDREW E 10/19/2012, 6:40 AM

## 2012-10-19 NOTE — Progress Notes (Signed)
INITIAL NUTRITION ASSESSMENT  DOCUMENTATION CODES Per approved criteria  -Obesity Unspecified   INTERVENTION: 1.  General healthful diet; discussed intake and oriented pt to menu.  Discussed current diet order and ways to get foods she likes.  No supplements needed at this time.  NUTRITION DIAGNOSIS: Increased nutrient needs related to metabolic demand as evidenced by metastatic disease, .   Monitor:  1.  Food/Beverage; pt meeting >/=90% estimated needs with tolerance.  Ultimate goal of comfort care to be upheld so pt is eating as desired.  2.  Wt/wt change; monitor trends  Reason for Assessment: MST  79 y.o. female  Admitting Dx: Embolic cerebral infarction  ASSESSMENT: Pt admitted with deconditioning r/t to stroke.  Pt also with metastatic adenocarcinoma of appendix.  Pt is followed by hospice and is planning to d/c home with hospice services. Pt states her appetite is good, however her ability to eat a normal portion has decreased.  States she sometimes vomits if she eats too much. Pt is also getting weekly paracentesis for ascites. Pt is pleasant, able to communicate when able to find the words to say.  Reviewed menu with pt and discussed current diet order.  Pt states she would really like ice water and ice cream. Will ensure she is getting magic cups.   States she has lost ~30 lbs; some may be fluid loss.   Height: Ht Readings from Last 1 Encounters:  10/18/12 5\' 1"  (1.549 m)    Weight: Wt Readings from Last 1 Encounters:  10/18/12 170 lb 6.7 oz (77.3 kg)    Ideal Body Weight: 105 lbs  % Ideal Body Weight: 161%  Wt Readings from Last 10 Encounters:  10/18/12 170 lb 6.7 oz (77.3 kg)  10/15/12 183 lb (83.008 kg)  10/06/12 185 lb (83.915 kg)  09/06/12 194 lb 12.8 oz (88.361 kg)  08/23/12 199 lb 12.8 oz (90.629 kg)  08/03/12 202 lb 6.4 oz (91.808 kg)  07/14/12 211 lb 3.2 oz (95.8 kg)  06/21/12 209 lb 3.2 oz (94.892 kg)  06/06/12 204 lb 6.4 oz (92.715 kg)   05/24/12 200 lb 8 oz (90.946 kg)    Usual Body Weight: 200 lbs  % Usual Body Weight: 85%  BMI:  Body mass index is 32.22 kg/(m^2).  Estimated Nutritional Needs: Kcal: 1660-1800 Protein: 62-78g Fluid: ~1.8 L/day  Skin: intact  Diet Order: Dysphagia 2, nectar-thick  EDUCATION NEEDS: -Education needs addressed   Intake/Output Summary (Last 24 hours) at 10/19/12 1440 Last data filed at 10/19/12 1200  Gross per 24 hour  Intake    600 ml  Output      0 ml  Net    600 ml    Last BM: 9/2  Labs:   Recent Labs Lab 10/15/12 0410 10/16/12 0548 10/19/12 0705  NA 141 142 141  K 3.4* 4.4 5.1  CL 106 105 104  CO2 27 30 31   BUN 10 11 18   CREATININE 0.67 0.62 0.84  CALCIUM 8.2* 8.5 9.2  GLUCOSE 136* 144* 120*    CBG (last 3)   Recent Labs  10/18/12 2142 10/19/12 0724 10/19/12 1131  GLUCAP 177* 94 190*    Scheduled Meds: . antiseptic oral rinse  15 mL Mouth Rinse BID  . aspirin  325 mg Oral Daily  . buPROPion  100 mg Oral Daily  . carvedilol  3.125 mg Oral BID WC  . [START ON 10/20/2012] fentaNYL  25 mcg Transdermal Q72H  . lidocaine  1 patch Transdermal Q24H  .  ondansetron  4 mg Oral TID AC  . polyethylene glycol  17 g Oral Daily  . potassium chloride SA  10 mEq Oral Daily  . venlafaxine XR  75 mg Oral Q breakfast    Continuous Infusions:   Past Medical History  Diagnosis Date  . Cataract   . Diabetes mellitus   . Cancer     Past Surgical History  Procedure Laterality Date  . Bladder surgery  06/1989  . Knee surgery  10/30/1993    Lt   . Back surgery  20/11/1998    ruptured disc  . Colon surgery    . Hernia repair      Loyce Dys, MS RD LDN Clinical Inpatient Dietitian Pager: [phone] Weekend/After hours pager: [phone]

## 2012-10-19 NOTE — Evaluation (Signed)
Speech Language Pathology Assessment and Plan  Patient Details  Name: [name] MRN: [id number] Date of Birth: [birth year]  SLP Diagnosis: Dysphagia;Speech and Language deficits  Rehab Potential: Good ELOS: 2 weeks   Today's Date: 10/19/2012 Time: 1345-1450 Time Calculation (min): 65 min  Problem List:  Patient Active Problem List   Diagnosis Date Noted  . Embolic cerebral infarction 10/18/2012  . CVA (cerebral infarction) 10/13/2012  . 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 DVT 02/22/2007  . BRONCHITIS 02/22/2007  . SLEEP APNEA 02/22/2007  . DYSPNEA 02/22/2007   Past Medical History:  Past Medical History  Diagnosis Date  . Cataract   . Diabetes mellitus   . Cancer    Past Surgical History:  Past Surgical History  Procedure Laterality Date  . Bladder surgery  06/1989  . Knee surgery  10/30/1993    Lt   . Back surgery  20/11/1998    ruptured disc  . Colon surgery    . Hernia repair      Assessment / Plan / Recommendation Clinical Impression  79 y.o. right-handed female with history of metastatic adenocarcinoma diagnosed 2012. Previous chemotherapy treatments but stopped due to progression of her cancer, malignant ascites, diabetes mellitus peripheral neuropathy. Patient independent with rolling walker prior to admission. Admitted 10/13/2012 after a fall with noted slurred speech and left-sided weakness. MRI of the brain showed scattered small acute posterior right MCA territory infarcts. Several tiny acute infarcts also in the posterior left MCA territory. MRA of the head negative. Echocardiogram with ejection fraction of 55% normal systolic function. Carotid Dopplers with no ICA stenosis. Patient did not receive TPA. Neurology services consulted placed on aspirin therapy for CVA prophylaxis. Speech therapy for dysphagia currently maintained on a dysphagia 2 thin liquid diet. Noted chronic anemia hemoglobin 6.8 patient has been transfused with latest hemoglobin 10.1. Patient  is being followed by hospice care for history of progressive metastatic adenocarcinoma. She does undergo weekly therapeutic paracentesis for chronic ascites and followed by oncology services Dr. Arnoldo Lenis. Patient admitted to [facility] 10/18/12. Bedside Swallow and Cognitive-Linguistic Evaluations completed. Patient demonstrates mild oral and suspected pharyngeal weakness that does not impact her intelligibility, but does impact her safety with p.o. intake. Patient also demonstrates mild anomia during conversational speech. As a result, it is recommended that this patient receive skilled SLP services to address these deficits, maximize functional independence and determine safest p.o. intake prior to discharge home with 24/7 care.       SLP Assessment  Patient will need skilled Speech Lanaguage Pathology Services during CIR admission    Recommendations  Diet Recommendations: Dysphagia 2 (Fine chop);Nectar-thick liquid Liquid Administration via: Cup;No straw Medication Administration: Crushed with puree Supervision: Patient able to self feed;Intermittent supervision to cue for compensatory strategies Compensations: Slow rate;Small sips/bites;Multiple dry swallows after each bite/sip;Effortful swallow Postural Changes and/or Swallow Maneuvers: Seated upright 90 degrees;Upright 30-60 min after meal Oral Care Recommendations: Oral care BID Patient destination: Home Follow up Recommendations: Home Health SLP;24 hour supervision/assistance Equipment Recommended: None recommended by SLP    SLP Frequency 5 out of 7 days   SLP Treatment/Interventions Cueing hierarchy;Dysphagia/aspiration precaution training;Environmental controls;Functional tasks;Internal/external aids;Patient/family education;Speech/Language facilitation;Therapeutic Activities    Skilled treatment initiated and SLP provided Supervision level verbal cues to utilize safe swallow strategies to maximize safety with p.o.    Pain Pain Assessment Pain Assessment: No/denies pain Prior Functioning Cognitive/Linguistic Baseline: Baseline deficits Baseline deficit details: pt reported she managed finances but dtr managed meds Education: college  Short Term Goals: Week 1: SLP Short Term Goal 1 (Week 1): Patient will utilize word finding strategies with Mod I  SLP Short Term Goal 2 (Week 1): Patient will consume Dys.2 textures and thin liquids with minimal overt s/s of aspiration with Supervision verbal cues.  See FIM for current functional status Refer to Care Plan for Long Term Goals  Recommendations for other services: None  Discharge Criteria: Patient will be discharged from SLP if patient refuses treatment 3 consecutive times without medical reason, if treatment goals not met, if there is a change in medical status, if patient makes no progress towards goals or if patient is discharged from hospital.  The above assessment, treatment plan, treatment alternatives and goals were discussed and mutually agreed upon: by patient  Fae Pippin, M.A., CCC-SLP [phone]  [name] 10/19/2012, 4:40 PM

## 2012-10-19 NOTE — Care Management Note (Signed)
Inpatient Rehabilitation Center Individual Statement of Services  Patient Name:  [name]  Date:  10/19/2012  Welcome to the Inpatient Rehabilitation Center.  Our goal is to provide you with an individualized program based on your diagnosis and situation, designed to meet your specific needs.  With this comprehensive rehabilitation program, you will be expected to participate in at least 3 hours of rehabilitation therapies Monday-Friday, with modified therapy programming on the weekends.  Your rehabilitation program will include the following services:  Physical Therapy (PT), Occupational Therapy (OT), Speech Therapy (ST), 24 hour per day rehabilitation nursing, Case Management (Social Worker), Rehabilitation Medicine, Nutrition Services and Pharmacy Services  Weekly team conferences will be held on Wednesday to discuss your progress.  Your Social Worker will talk with you frequently to get your input and to update you on team discussions.  Team conferences with you and your family in attendance may also be held.  Expected length of stay: 10-14 days Overall anticipated outcome: supervision-set up-some min  Depending on your progress and recovery, your program may change. Your Social Worker will coordinate services and will keep you informed of any changes. Your Social Worker's name and contact numbers are listed  below.  The following services may also be recommended but are not provided by the Inpatient Rehabilitation Center:   Driving Evaluations  Home Health Rehabiltiation Services  Outpatient Rehabilitation Services   Arrangements will be made to provide these services after discharge if needed.  Arrangements include referral to agencies that provide these services.  Your insurance has been verified to be:  [employer] Your primary doctor is:  Dr Marden Noble  Pertinent information will be shared with your doctor and your insurance company.  Social Worker:  Dossie Der, SW  [phone] or [phone]  Information discussed with and copy given to patient by: Lucy Chris, 10/19/2012, 9:30 AM

## 2012-10-19 NOTE — Progress Notes (Signed)
Occupational Therapy Session Note  Patient Details  Name: [name] MRN: [id number] Date of Birth: [birth year]  Today's Date: 10/19/2012 Time: 4098-1191 Time Calculation (min): 32 min  Short Term Goals: Week 1:  OT Short Term Goal 1 (Week 1): Pt will perform LB bathing with supervision using AE. OT Short Term Goal 2 (Week 1): Pt will perform LB dressing with min assist using AE sit to stand. OT Short Term Goal 3 (Week 1): Pt will perform toilet transfer with min guard assist using 4 wheeled walker OT Short Term Goal 4 (Week 1): Pt will manage clothing during toileting tasks with min assist. OT Short Term Goal 5 (Week 1): Pt will donn pulllover or button up shirt with min assist.  Skilled Therapeutic Interventions/Progress Updates:  Pt worked on grooming tasks at the sink in standing.  She was able to use her walker and ambulate to the sink from her wheelchair for brushing her hair and teeth in standing.  She was able to maintain standing for 4-5 mins during these tasks with min assist to min guard assist for balance.  She utilized her long handle brush to comb her hair but needed both hands to assist secondary to limited shoulder AROM.  Oxygen sats 92% on 2Ls nasal cannula during session.   Finished session by looking closer at her vision which appears to be at baseline with regards to peripheral fields, acuity, and tracking.  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

## 2012-10-19 NOTE — Progress Notes (Signed)
Patient information reviewed and entered into eRehab system by Feiga Nadel, RN, CRRN, PPS Coordinator.  Information including medical coding and functional independence measure will be reviewed and updated through discharge.     Per nursing patient was given "Data Collection Information Summary for Patients in Inpatient Rehabilitation Facilities with attached "Privacy Act Statement-Health Care Records" upon admission.  

## 2012-10-19 NOTE — Evaluation (Signed)
Physical Therapy Assessment and Plan  Patient Details  Name: [name] MRN: [id number] Date of Birth: October 03, 1922  PT Diagnosis: Abnormal posture, Abnormality of gait, Difficulty walking, Muscle weakness and Pain in R shoulder Rehab Potential: Good ELOS: 12-14 days   Today's Date: 10/19/2012 Time: 5621-3086 Time Calculation (min): 64 min  Problem List:  Patient Active Problem List   Diagnosis Date Noted  . Embolic cerebral infarction 10/18/2012  . CVA (cerebral infarction) 10/13/2012  . 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 DVT 02/22/2007  . BRONCHITIS 02/22/2007  . SLEEP APNEA 02/22/2007  . DYSPNEA 02/22/2007    Past Medical History:  Past Medical History  Diagnosis Date  . Cataract   . Diabetes mellitus   . Cancer    Past Surgical History:  Past Surgical History  Procedure Laterality Date  . Bladder surgery  06/1989  . Knee surgery  10/30/1993    Lt   . Back surgery  20/11/1998    ruptured disc  . Colon surgery    . Hernia repair      Assessment & Plan Clinical Impression: Patient is a 79 y.o. year old female with history of metastatic adenocarcinoma diagnosed 2012.previous chemotherapy treatments but stopped due to progression of her cancer, malignant ascites, diabetes mellitus peripheral neuropathy. Patient independent with rolling walker prior to admission. Admitted 10/13/2012 after a fall with noted slurred speech and left-sided weakness. MRI of the brain showed scattered small acute posterior right MCA territory infarcts. Several tiny acute infarcts also in the posterior left MCA territory. MRA of the head negative. Echocardiogram with ejection fraction of 55% normal systolic function. Carotid Dopplers with no ICA stenosis. Patient did not receive TPA. Neurology services consulted placed on aspirin therapy for CVA prophylaxis. Speech therapy for dysphagia currently maintained on a dysphagia 2 thin liquid diet. Noted chronic anemia hemoglobin 6.8 patient has been  transfused with latest hemoglobin 10.1. Patient is being followed by hospice care for history of progressive metastatic adenocarcinoma. She does undergo weekly therapeutic paracentesis for chronic ascites and followed by oncology services Dr. Arnoldo Lenis. Patient transferred to CIR on 10/18/2012 .   Patient currently requires mod with mobility secondary to muscle weakness, decreased cardiorespiratoy endurance and decreased oxygen support and impaired timing and sequencing, motor apraxia and decreased motor planning.  Prior to hospitalization, patient was supervision with mobility (did require some assist with bathing,dressing and meal prep) and lived with Significant other (husband is bed bound) in a House home.  Home access is  Ramped entrance.  Patient will benefit from skilled PT intervention to maximize safe functional mobility, minimize fall risk and decrease caregiver burden for planned discharge home with 24 hour supervision.  Anticipate patient will benefit from follow up HH at discharge.  PT - End of Session Activity Tolerance: Tolerates < 10 min activity with changes in vital signs Endurance Deficit: Yes Endurance Deficit Description: Pt requires several rest breaks during session due to SOB and dyspnea.  Demos 3/4 DOE during ambulation and transfers.   PT Assessment Rehab Potential: Good PT Patient demonstrates impairments in the following area(s): Balance;Endurance;Motor;Pain;Safety PT Transfers Functional Problem(s): Bed Mobility;Bed to Chair;Car PT Locomotion Functional Problem(s): Ambulation;Wheelchair Mobility PT Plan PT Intensity: Minimum of 1-2 x/day ,45 to 90 minutes PT Frequency: 5 out of 7 days PT Duration Estimated Length of Stay: 12-14 days PT Treatment/Interventions: Ambulation/gait training;Balance/vestibular training;Discharge planning;DME/adaptive equipment instruction;Functional mobility training;Patient/family education;Therapeutic Activities;Therapeutic Exercise;UE/LE  Strength taining/ROM;UE/LE Coordination activities;Wheelchair propulsion/positioning PT Transfers Anticipated Outcome(s): Supervision for bed mobility and transfers  PT Locomotion Anticipated Outcome(s): Supervision overall PT Recommendation Follow Up Recommendations: Home health PT;24 hour supervision/assistance Patient destination: Home Equipment Details: may need RW depending on progress.    Skilled Therapeutic Intervention Performed stand pivot transfer in room with therapist providing HHA for support (min to mod assist) to bed to perform bed mobility.  Performed supine <> sit (details stated below) with cues for continued pursed lip breathing as she tends to hold breath during transitional movements.  Pt with very limited endurance during therapy.  Performed sensation/strength/coordination on RA with SaO2 at 94%, therefore continued session on RA with SaO2 dropping to 87-88% following amb.  Note that HR increased to 132 during session, therefore allowed several long rest breaks for vitals to improve.  Ambulated without AD approx 20' at [facility] assist with cues for increasing weight shift R, continued pursed lip breathing, and upright posture.  Note that when returning to chair, pt had bout of anxiety and attempted to sit down without chair being directly behind her.  Provided max cues to stand erect so that chair could be placed behind her.  Assisted back to room in w/c and had pt ambulate in room with rollator at min/mod assist at VERY slow gait speed, however safer than without AD.  Left in arm chair in room with all needs in place and OT entering room for session.  Also discussed D/C planning, equipment needs and ELOS.    PT Evaluation Precautions/Restrictions Precautions Precautions: Fall Precaution Comments: moderate arthritis in bilateral shoulders Restrictions Weight Bearing Restrictions: No General Chart Reviewed: Yes Vital SignsTherapy Vitals Pulse Rate: 125 Patient Position, if  appropriate: Sitting Oxygen Therapy SpO2: 84 % O2 Device: None (Room air) Pulse Oximetry Type: Intermittent Pain Pain Assessment Pain Assessment: 0-10 Pain Score: 6  Faces Pain Scale: Hurts a little bit Pain Type: Chronic pain Pain Location: Shoulder Pain Orientation: Right Pain Descriptors / Indicators: Aching Pain Intervention(s): Rest Home Living/Prior Functioning Home Living Living Arrangements: Spouse/significant other;Other (Comment) (24 hr caregiver for husband) Available Help at Discharge: Available 24 hours/day;Personal care attendant Type of Home: House Home Access: Ramped entrance Home Layout: One level  Lives With: Significant other (husband is bed bound) Prior Function Level of Independence: Needs assistance with ADLs;Requires assistive device for independence;Needs assistance with homemaking  Able to Take Stairs?: No Driving: No 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 They cook for both    Cognition Overall Cognitive Status: Impaired/Different from baseline Arousal/Alertness: Awake/alert Orientation Level: Oriented X4 Attention: Sustained Sustained Attention: Appears intact Memory: Impaired Memory Impairment: Decreased recall of new information Awareness: Appears intact Problem Solving: Appears intact Comments: Pt reports greater difficulty with processing of dressing tasks than normal. Sensation Sensation Light Touch: Appears Intact Stereognosis: Appears Intact Hot/Cold: Appears Intact Proprioception: Appears Intact Additional Comments: somewhat decreased sensation at back of L lower posterior leg Coordination Gross Motor Movements are Fluid and Coordinated: No Fine Motor Movements are Fluid and Coordinated: No Coordination and Movement Description: Pt with severe arthritis in both shoulders and hands making all movements difficult and limited secondary to AROM and increased  pain Heel Shin Test: Pt with decreased smooth motor movements with L over R heel to shin test.  Motor  Motor Motor: Abnormal postural alignment and control Motor - Skilled Clinical Observations: Pt moves slow overall and demonstrates kyphotic posture.  Noted some minor apraxic movements of LUE during session.   Mobility Bed Mobility Bed Mobility: Supine to Sit;Sit to Supine Supine to  Sit: HOB flat;With rails;3: Mod assist Supine to Sit Details: Verbal cues for technique;Verbal cues for sequencing;Manual facilitation for weight shifting Sit to Supine: 4: Min assist;HOB flat;With rail Sit to Supine - Details: Verbal cues for technique;Verbal cues for precautions/safety;Manual facilitation for placement Transfers Transfers: Yes Sit to Stand: 4: Min assist;With armrests;From bed;From chair/3-in-1 Sit to Stand Details: Manual facilitation for weight shifting;Verbal cues for safe use of DME/AE;Verbal cues for sequencing Stand to Sit: 4: Min assist;With upper extremity assist;With armrests;To bed;To chair/3-in-1 Stand to Sit Details (indicate cue type and reason): Manual facilitation for weight shifting;Verbal cues for technique;Verbal cues for precautions/safety Locomotion  Ambulation Ambulation: Yes Ambulation/Gait Assistance: 3: Mod assist Ambulation Distance (Feet): 20 Feet (and another 10' in room) Assistive device: 4-wheeled walker (and HHA) Ambulation/Gait Assistance Details: Manual facilitation for weight shifting;Verbal cues for safe use of DME/AE;Verbal cues for gait pattern;Verbal cues for precautions/safety Ambulation/Gait Assistance Details: Requires max verbal cues for safety, esp when ambulating back to chair without AD as she had increased anxiety and felt need to sit without chair being there.  She states she felt as though she were going to fall and demonstrated extreme forward lean.  Gait Gait: Yes Gait Pattern: Impaired Gait Pattern: Step-to pattern;Decreased hip/knee  flexion - left;Decreased hip/knee flexion - right;Shuffle;Lateral trunk lean to left;Decreased weight shift to right Wheelchair Mobility Distance: 10'  Trunk/Postural Assessment  Thoracic Assessment Thoracic Assessment: Exceptions to [facility] Postural Control Postural Control: Deficits on evaluation Postural Limitations: pt with very kyphotic posture with forward head during session.      Extremity Assessment      RLE Assessment RLE Assessment: Exceptions to [facility] RLE Strength RLE Overall Strength: Deficits RLE Overall Strength Comments: Overall WFL with exception of hip flex with presents with 3+/5 strength.  LLE Assessment LLE Assessment: Exceptions to [facility] LLE Strength LLE Overall Strength: Deficits LLE Overall Strength Comments: hip flex 3+/5, knee ext 3+/5, knee flex 4/5, ankle DF 4/5, PF 5/5  FIM:  FIM - Bed/Chair Transfer Bed/Chair Transfer Assistive Devices: Bed rails;Arm rests (HHA) Bed/Chair Transfer: 3: Supine > Sit: Mod A (lifting assist/Pt. 50-74%/lift 2 legs;4: Sit > Supine: Min A (steadying pt. > 75%/lift 1 leg);3: Bed > Chair or W/C: Mod A (lift or lower assist);3: Chair or W/C > Bed: Mod A (lift or lower assist) FIM - Locomotion: Wheelchair Distance: 10' Locomotion: Wheelchair: 1: Travels less than 50 ft with moderate assistance (Pt: 50 - 74%) FIM - Locomotion: Ambulation Locomotion: Ambulation Assistive Devices:  (HHA and rollator) Ambulation/Gait Assistance: 3: Mod assist Locomotion: Ambulation: 1: Travels less than 50 ft with moderate assistance (Pt: 50 - 74%) FIM - Locomotion: Stairs Locomotion: Stairs: 0: Activity did not occur   Refer to Care Plan for Long Term Goals  Recommendations for other services: None  Discharge Criteria: Patient will be discharged from PT if patient refuses treatment 3 consecutive times without medical reason, if treatment goals not met, if there is a change in medical status, if patient makes no progress towards goals or if patient is  discharged from hospital.  The above assessment, treatment plan, treatment alternatives and goals were discussed and mutually agreed upon: by patient  Vista Deck 10/19/2012, 12:14 PM

## 2012-10-19 NOTE — IPOC Note (Signed)
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  -Xeloda resumed August 2013, restaging CT 03/06/2012 confirmed disease progression  -initiation of every 2 week panitumumab 04/26/2012. She completed 3 treatments.  2. Diabetes  3. Sleep apnea  4. Depression  5. History of a Skin rash secondary to panitumumab.  6. Pitting edema of the lower legs-likely related to abdominal carcinomatosis and hypoalbuminemia. Improved.  7. 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 Now on a Duragesic patch  8. Ascites. She requires periodic therapeutic paracentesis procedures.  9. Exertional dyspnea-the dyspnea improves after a paracentesis. The dyspnea is most likely related to abdominal distention and atelectasis.  10. Left leg "tightness".Negative left leg Doppler on 09/06/2012  11.admission with an acute CVA on 10/13/2012-? Embolic,? Mar[place] endocarditis-now on the rehabilitation medicine service.        Functional Problem List: Nursing Safety;Nutrition;Medication Management;Skin Integrity;Sensory;Pain;Endurance;Bladder;Bowel;Motor  PT Balance;Endurance;Motor;Pain;Safety  OT Balance;Endurance;Pain  SLP    TR         Basic ADL's: OT Grooming;Bathing;Dressing;Toileting     Advanced  ADL's: OT       Transfers: PT Bed Mobility;Bed to Chair;Car  OT Toilet;Tub/Shower     Locomotion: PT Ambulation;Wheelchair Mobility     Additional Impairments: OT Fuctional Use of Upper Extremity (bilateral UEs)  SLP        TR      Anticipated  Outcomes Item Anticipated Outcome  Self Feeding    Swallowing      Basic self-care  min assist level  Toileting  min assist level   Bathroom Transfers supervision  Bowel/Bladder  pt will have normal BM and frequency Voiding without any problems (within normal limits as offering every 2 hour   Transfers  Supervision for bed mobility and transfers  Locomotion  Supervision overall  Communication     Cognition     Pain  less than 4  Safety/Judgment  no falls ,no injury   Therapy Plan: PT Intensity: Minimum of 1-2 x/day ,45 to 90 minutes PT Frequency: 5 out of 7 days PT Duration Estimated Length of Stay: 12-14 days OT Intensity: Minimum of 1-2 x/day, 45 to 90 minutes OT Frequency: 5 out of 7 days OT Duration/Estimated Length of Stay: 12-14 days         Team Interventions: Nursing Interventions Patient/Family Education;Dysphagia/Aspiration Precaution Training;Psychosocial Support;Disease Management/Prevention  PT interventions Ambulation/gait training;Balance/vestibular training;Discharge planning;DME/adaptive equipment instruction;Functional mobility training;Patient/family education;Therapeutic Activities;Therapeutic Exercise;UE/LE Strength taining/ROM;UE/LE Coordination activities;Wheelchair propulsion/positioning  OT Interventions Balance/vestibular training;Community reintegration;Discharge planning;Functional mobility training;DME/adaptive equipment instruction;Pain management;Patient/family education;Therapeutic Activities;Self Care/advanced ADL retraining;Therapeutic Exercise;UE/LE Strength taining/ROM;UE/LE Coordination activities  SLP Interventions    TR Interventions    SW/CM Interventions Discharge Planning;Psychosocial Support;Patient/Family Education    Team Discharge Planning: Destination: PT-Home ,OT- Home , SLP-  Projected Follow-up: PT-Home health PT;24 hour supervision/assistance, OT-  24 hour supervision/assistance, SLP-  Projected Equipment Needs: PT- ,  OT- None recommended by OT, SLP-  Patient/family involved in discharge planning: PT- Patient,  OT-Patient, SLP-   MD ELOS: 2 wks Medical Rehab Prognosis:  Good Assessment: 79 yo female with metastatic adenoca admitted with acute CVA.   Now requiring 24/7 Rehab RN,MD, as well as CIR level PT, OT and SLP.  Treatment team will  focus on ADLs and mobility with goals set at [facility] /sup.    See Team Conference Notes for weekly updates to the plan of care

## 2012-10-19 NOTE — Progress Notes (Signed)
Social Work Assessment and Plan Social Work Assessment and Plan  Patient Details  Name: [name] MRN: [id number] Date of Birth: July 30, 1922  Today's Date: 10/19/2012  Problem List:  Patient Active Problem List   Diagnosis Date Noted  . Embolic cerebral infarction 10/18/2012  . CVA (cerebral infarction) 10/13/2012  . 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 DVT 02/22/2007  . BRONCHITIS 02/22/2007  . SLEEP APNEA 02/22/2007  . DYSPNEA 02/22/2007   Past Medical History:  Past Medical History  Diagnosis Date  . Cataract   . Diabetes mellitus   . Cancer    Past Surgical History:  Past Surgical History  Procedure Laterality Date  . Bladder surgery  06/1989  . Knee surgery  10/30/1993    Lt   . Back surgery  20/11/1998    ruptured disc  . Colon surgery    . Hernia repair     Social History:  reports that she has never smoked. She has never used smokeless tobacco. She reports that she does not drink alcohol. Her drug history is not on file.  Family / Support Systems Marital Status: Married How Long?: 67 years Patient Roles: Spouse;Parent;Other (Comment) (Hospice Pt) Children: Therese Chasse-daughter  [phone] Other Supports: Benancio Deeds  [phone] Anticipated Caregiver: Hired caregiver's Ability/Limitations of Caregiver: Husband currently has a 24 hr caregiver at home Caregiver Availability: 24/7 Family Dynamics: Close knit family who are supportive and invovled.  One local and three others not too far away, but stay abreast of the situation.  Social History Preferred language: English Religion: Methodist Cultural Background: No issues Education: [employer] Read: Yes Write: Yes Employment Status: Retired Fish farm manager Issues: No issues Guardian/Conservator: None-according to MD pt is capable of making her own decisions   Abuse/Neglect Physical Abuse: Denies Verbal Abuse: Denies Sexual Abuse: Denies Exploitation of  patient/patient's resources: Denies Self-Neglect: Denies  Emotional Status Pt's affect, behavior adn adjustment status: Pt is motivated to get bakc to ambulating with her walker.  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  She has a good attitude and will do what our therapist's ask her to do. Recent Psychosocial Issues: Other medical issues-cancer Pyschiatric History: Depression currently takes depression meds and feels this does help.  She also is seen by the chaplian and finds this helpful.  She takes each day at a time and enjoys each being here.  Will provide support while here. Substance Abuse History: No issues  Patient / Family Perceptions, Expectations & Goals Pt/Family understanding of illness & functional limitations: Pt is well aware of her diagnoses and talks with the MD to stay abreast of what is going on with her medically.  Her daughter's are RN's and well aware of her medical issues. Premorbid pt/family roles/activities: Wife, Mother, Grandmother, Retiree, Church member, etc Anticipated changes in roles/activities/participation: resume Pt/family expectations/goals: Pt states: " I want to be able to walk with my walker by the time I leave here."  Daughter states: " I am hopeful she will get mobile again."  [employer]: Other (Comment) (Hospice Pt) Premorbid Home Care/DME Agencies: Other (Comment) (Has DME) Transportation available at discharge: Family Resource referrals recommended: Support group (specify) (Cancer & CVA Support Group)  Discharge Planning Living Arrangements: Spouse/significant other;Other (Comment) (24 hr caregiver for husband) Support Systems: Spouse/significant other;Children;Friends/neighbors;Church/faith community;Other (Comment) (Hospice) Type of Residence: Private residence Insurance Resources: Media planner (specify) Passenger transport manager) Surveyor, quantity Resources: Tree surgeon;Family Support Financial Screen Referred:  No Living Expenses: Own Money Management:  Patient;Family Does the patient have any problems obtaining your medications?: No Home Management: Hired out-caregiver provided meals for both Patient/Family Preliminary Plans: Return home with caregiver if necessary.  May need to hire another for pt, but will wait and see her progress.  Family very involved and will do what Mom needs. Social Work Anticipated Follow Up Needs: Other (comment);Support Group  Clinical Impression Very motivated patient who does not plan on giving up.  Her goal is to get back walking with her walker.  Will resume Hospice services upon discharge since had to be discharged to come to rehab. Will work toward pt's rehab goals.  Lucy Chris 10/19/2012, 10:30 AM

## 2012-10-19 NOTE — Evaluation (Signed)
Occupational Therapy Assessment and Plan  Patient Details  Name: [name] MRN: [id number] Date of Birth: Sep 23, 1922  OT Diagnosis: abnormal posture, acute pain and muscle weakness (generalized) Rehab Potential: Rehab Potential: Good ELOS: 12-14 days   Today's Date: 10/19/2012 Time: 4098-1191 Time Calculation (min): 64 min  Problem List:  Patient Active Problem List   Diagnosis Date Noted  . Embolic cerebral infarction 10/18/2012  . CVA (cerebral infarction) 10/13/2012  . 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 DVT 02/22/2007  . BRONCHITIS 02/22/2007  . SLEEP APNEA 02/22/2007  . DYSPNEA 02/22/2007    Past Medical History:  Past Medical History  Diagnosis Date  . Cataract   . Diabetes mellitus   . Cancer    Past Surgical History:  Past Surgical History  Procedure Laterality Date  . Bladder surgery  06/1989  . Knee surgery  10/30/1993    Lt   . Back surgery  20/11/1998    ruptured disc  . Colon surgery    . Hernia repair      Assessment & Plan Clinical Impression: Patient is a 79 y.o. year old female with recent admission to the hospital on 10/13/2012 after a fall with noted slurred speech and left-sided weakness. MRI of the brain showed scattered small acute posterior right MCA territory infarcts. Several tiny acute infarcts also in the posterior left MCA territory.  Patient transferred to CIR on 10/18/2012 .    Patient currently requires max with basic self-care skills secondary to muscle weakness and muscle joint tightness and decreased coordination.  Prior to hospitalization, patient could complete ADLS with min assist level.  Patient will benefit from skilled intervention to decrease level of assist with basic self-care skills and increase independence with basic self-care skills prior to discharge home with 24 hour assist from hired caregivers.  Anticipate patient will require 24 hour supervision and minimal physical assistance and follow up home health.  OT - End of  Session Activity Tolerance: Tolerates 30+ min activity with multiple rests Endurance Deficit: Yes Endurance Deficit Description: Pt with increased SOB and increased HR into the 120s with simple bathing and dressing tasks. OT Assessment Rehab Potential: Good OT Patient demonstrates impairments in the following area(s): Balance;Endurance;Pain OT Basic ADL's Functional Problem(s): Grooming;Bathing;Dressing;Toileting OT Transfers Functional Problem(s): Toilet;Tub/Shower OT Additional Impairment(s): Fuctional Use of Upper Extremity (bilateral UEs) OT Plan OT Intensity: Minimum of 1-2 x/day, 45 to 90 minutes OT Frequency: 5 out of 7 days OT Duration/Estimated Length of Stay: 12-14 days OT Treatment/Interventions: Metallurgist training;Community reintegration;Discharge planning;Functional mobility training;DME/adaptive equipment instruction;Pain management;Patient/family education;Therapeutic Activities;Self Care/advanced ADL retraining;Therapeutic Exercise;UE/LE Strength taining/ROM;UE/LE Coordination activities OT Basic Self-Care Anticipated Outcome(s): min assist level OT Toileting Anticipated Outcome(s): min assist level OT Bathroom Transfers Anticipated Outcome(s): supervision OT Recommendation Patient destination: Home Follow Up Recommendations: 24 hour supervision/assistance Equipment Recommended: None recommended by OT   Skilled Therapeutic Intervention During session pt worked on bed mobility, sitting and standing balance, UE functional use and AROM, all while participating in toileting and B/D tasks.  OT Evaluation Precautions/Restrictions  Precautions Precautions: Fall Precaution Comments: moderate arthritis in bilateral shoulders Restrictions Weight Bearing Restrictions: No  Vital Signs Therapy Vitals Pulse Rate: 125 Patient Position, if appropriate: Sitting Oxygen Therapy SpO2: 84 % O2 Device: None (Room air) Pulse Oximetry Type: Intermittent Pain Pain  Assessment Pain Assessment: 0-10 Pain Score: 6  Faces Pain Scale: Hurts a little bit Pain Type: Chronic pain Pain Location: Shoulder Pain Orientation: Right Pain Descriptors / Indicators: Aching Pain Intervention(s): Rest Home Living/Prior  Functioning Home Living Living Arrangements: Spouse/significant other;Other (Comment) (24 hr caregiver for husband) Available Help at Discharge: Available 24 hours/day;Personal care attendant Type of Home: House Home Access: Ramped entrance Home Layout: One level  Lives With: Significant other (husband is bed bound) IADL History Homemaking Responsibilities: No Current License: No Prior Function Level of Independence: Needs assistance with ADLs;Requires assistive device for independence;Needs assistance with homemaking  Able to Take Stairs?: No Driving: No 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 They cook for both ADL  See FIM scale for levels   Cognition Overall Cognitive Status: Impaired/Different from baseline Arousal/Alertness: Awake/alert Orientation Level: Oriented X4 Attention: Sustained Sustained Attention: Appears intact Memory: Impaired Memory Impairment: Decreased recall of new information Awareness: Appears intact Problem Solving: Appears intact Comments: Pt reports greater difficulty with processing of dressing tasks than normal. Sensation Sensation Light Touch: Appears Intact Stereognosis: Appears Intact Hot/Cold: Appears Intact Proprioception: Appears Intact Coordination Gross Motor Movements are Fluid and Coordinated: No Fine Motor Movements are Fluid and Coordinated: No Coordination and Movement Description: Pt with severe arthritis in both shoulders and hands making all movements difficult and limited secondary to AROM and increased pain Motor  Motor Motor: Abnormal postural alignment and control Motor - Skilled Clinical Observations:  Pt moves slow overall and demonstrates kyphotic posture.  Noted some minor apraxic movements of LUE during session.  Mobility  Bed Mobility Bed Mobility: Supine to Sit;Sit to Supine Supine to Sit: HOB flat;With rails;3: Mod assist Supine to Sit Details: Verbal cues for technique;Verbal cues for sequencing;Manual facilitation for weight shifting Sitting - Scoot to Edge of Bed: 4: Min assist;With rail Sitting - Scoot to [place] of Bed Details: Verbal cues for gait pattern Sit to Supine: 4: Min assist;HOB flat;With rail Sit to Supine - Details: Verbal cues for technique;Verbal cues for precautions/safety;Manual facilitation for placement Transfers Transfers: Sit to Stand;Stand to Sit Sit to Stand: 4: Min assist;With armrests;From bed;From chair/3-in-1 Sit to Stand Details: Manual facilitation for weight shifting;Verbal cues for safe use of DME/AE;Verbal cues for sequencing Stand to Sit: 4: Min assist;With upper extremity assist;With armrests;To bed;To chair/3-in-1 Stand to Sit Details (indicate cue type and reason): Manual facilitation for weight shifting;Verbal cues for technique;Verbal cues for precautions/safety  Trunk/Postural Assessment  Cervical Assessment Cervical Assessment: Within Functional Limits Thoracic Assessment Thoracic Assessment: Exceptions to [facility] Thoracic Strength Overall Thoracic Strength Comments: Pt demonstrates some thoracic kyphosis Postural Control Postural Control: Deficits on evaluation Postural Limitations: pt with very kyphotic posture with forward head during session.    Balance Dynamic Standing Balance Dynamic Standing - Balance Support: Right upper extremity supported;Left upper extremity supported Dynamic Standing - Level of Assistance: 4: Min assist Extremity/Trunk Assessment RUE Assessment RUE Assessment: Exceptions to [facility] RUE AROM (degrees) RUE Overall AROM Comments: Pt demonstrates severe to moderate arthritic changes in the shoulder and hand.  She  demonstrates AROM shoulder flexion 0-60 degrees before trunk compensation.  Elbow flexion and extension are WFLs as well as gross grasp and release.  Elbow and hand strength are rated at 4/5 with decreased FM capabilities secondary to arthritis.   LUE Assessment LUE Assessment: Exceptions to WFL LUE AROM (degrees) LUE Overall AROM Comments: Pt demonstrates severe to moderate arthritic changes in the shoulder and hand.  She demonstrates AROM shoulder flexion 0-50 degrees before trunk compensation.  Elbow flexion and extension are WFLs as well as gross grasp and release.  Elbow and hand strength are rated at 4/5 with decreased FM capabilities secondary  to arthritis.  FIM:  FIM - Eating Eating Activity: 5: Supervision/cues FIM - Grooming Grooming Steps: Wash, rinse, dry face;Wash, rinse, dry hands Grooming: 4: Patient completes 3 of 4 or 4 of 5 steps FIM - Bathing Bathing Steps Patient Completed: Chest;Right Arm;Left Arm;Abdomen;Front perineal area;Right upper leg;Left upper leg Bathing: 3: Mod-Patient completes 5-7 34f 10 parts or 50-74% FIM - Upper Body Dressing/Undressing Upper body dressing/undressing steps patient completed: Thread/unthread right sleeve of pullover shirt/dresss;Thread/unthread left sleeve of pullover shirt/dress Upper body dressing/undressing: 2: Max-Patient completed 25-49% of tasks FIM - Lower Body Dressing/Undressing Lower body dressing/undressing: 1: Total-Patient completed less than 25% of tasks FIM - Toileting Toileting steps completed by patient: Performs perineal hygiene Toileting: 2: Max-Patient completed 1 of 3 steps FIM - Bed/Chair Transfer Bed/Chair Transfer Assistive Devices: Bed rails;Arm rests (HHA) Bed/Chair Transfer: 3: Supine > Sit: Mod A (lifting assist/Pt. 50-74%/lift 2 legs;4: Sit > Supine: Min A (steadying pt. > 75%/lift 1 leg);3: Bed > Chair or W/C: Mod A (lift or lower assist);3: Chair or W/C > Bed: Mod A (lift or lower assist) FIM - Ambulance person Devices: Elevated toilet seat;Walker Toilet Transfers: 4-To toilet/BSC: Min A (steadying Pt. > 75%) FIM - Tub/Shower Transfers Tub/shower Transfers: 0-Activity did not occur or was simulated   Refer to Care Plan for Long Term Goals  Recommendations for other services: None  Discharge Criteria: Patient will be discharged from OT if patient refuses treatment 3 consecutive times without medical reason, if treatment goals not met, if there is a change in medical status, if patient makes no progress towards goals or if patient is discharged from hospital.  The above assessment, treatment plan, treatment alternatives and goals were discussed and mutually agreed upon: by patient  Lisandra Mathisen OTR/L 10/19/2012, 12:52 PM

## 2012-10-19 NOTE — Progress Notes (Signed)
IP PROGRESS NOTE  Subjective:   [name] was admitted on 10/13/2012 with an acute CVA. She developed left-sided weakness and dysarthria. She has been admitted to the rehabilitation medicine service.  I was asked to see her by Dr. Kevan Ny. She complains of  abdominal distention and pain. She has dyspnea with exertion. She reports right shoulder pain.  Objective: Vital signs in last 24 hours: Blood pressure 117/71, pulse 125, temperature 97.6 F (36.4 C), temperature source Axillary, resp. rate 19, height 5\' 1"  (1.549 m), weight 170 lb 6.7 oz (77.3 kg), SpO2 84.00%.  Intake/Output from previous day: 09/03 0701 - 09/04 0700 In: 240 [P.O.:240] Out: -   Physical Exam: Lungs: Decreased breath sounds at the lower chest bilaterally, increased respiratory rate Cardiac: Regular rate and rhythm Abdomen:  Distended with multiple abdominal masses Extremities: No leg edema Neurologic: Alert and oriented, moves all extremities    Lab Results:  Recent Labs  10/19/12 0705  WBC 9.5  HGB 9.9*  HCT 34.3*  PLT 327    BMET  Recent Labs  10/19/12 0705  NA 141  K 5.1  CL 104  CO2 31  GLUCOSE 120*  BUN 18  CREATININE 0.84  CALCIUM 9.2    Studies/Results: No results found.  Medications: I have reviewed the patient's current medications.  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  -Xeloda resumed August 2013, restaging CT 03/06/2012 confirmed disease progression  -initiation of every 2 week panitumumab 04/26/2012. She completed 3 treatments.  2. Diabetes  3. Sleep apnea  4. Depression  5. History of a Skin rash secondary to panitumumab.  6. Pitting edema of the lower legs-likely related to abdominal carcinomatosis and hypoalbuminemia. Improved.  7. Abdominal  pain secondary to metastatic appendiceal carcinoma-increased.she continues tramadol. Now on a Duragesic patch 8. Ascites. She requires periodic therapeutic paracentesis procedures.   9. Exertional dyspnea-the dyspnea improves after a paracentesis. The dyspnea is most likely related to abdominal distention and atelectasis.  10. Left leg "tightness".Negative left leg Doppler on 09/06/2012 11.admission with an acute CVA on 10/13/2012-? Embolic,? [place] endocarditis-now on the rehabilitation medicine service.   Recommendations:   1.therapeutic paracentesis today or tomorrow  2.continue Duragesic for pain, add morphine as needed for breakthrough pain  3.physical therapy per the rehabilitation medicine service  4.return home with hospice care at discharge   She has advanced metastatic appendiceal carcinoma. She was enrolled in the hospice program prior to admission. The goal is comfort care. Hopefully she will be able to return home with hospice. Please call oncology as needed while she is here.      LOS: 1 day   Kregg Cihlar  10/19/2012, 1:56 PM

## 2012-10-20 ENCOUNTER — Inpatient Hospital Stay (HOSPITAL_COMMUNITY): Admitting: Occupational Therapy

## 2012-10-20 ENCOUNTER — Inpatient Hospital Stay (HOSPITAL_COMMUNITY): Admitting: Rehabilitation

## 2012-10-20 ENCOUNTER — Ambulatory Visit (HOSPITAL_COMMUNITY): Admission: RE | Admit: 2012-10-20 | Source: Ambulatory Visit

## 2012-10-20 ENCOUNTER — Inpatient Hospital Stay (HOSPITAL_COMMUNITY)

## 2012-10-20 ENCOUNTER — Inpatient Hospital Stay (HOSPITAL_COMMUNITY): Admitting: Speech Pathology

## 2012-10-20 DIAGNOSIS — I6330 Cerebral infarction due to thrombosis of unspecified cerebral artery: Secondary | ICD-10-CM

## 2012-10-20 MED ORDER — FUROSEMIDE 40 MG PO TABS
40.0000 mg | ORAL_TABLET | Freq: Every day | ORAL | Status: DC
  Administered 2012-10-20 – 2012-10-23 (×4): 40 mg via ORAL
  Filled 2012-10-20 (×9): qty 1

## 2012-10-20 NOTE — Progress Notes (Signed)
Physical Therapy Session Note  Patient Details  Name: [name] MRN: [id number] Date of Birth: 15-Mar-1922  Today's Date: 10/20/2012 Time: 7829-5621 Time Calculation (min): 55 min  Short Term Goals: Week 1:  PT Short Term Goal 1 (Week 1): Pt will perform bed mobility at min assist  PT Short Term Goal 2 (Week 1): Pt will perform transfers at min assist PT Short Term Goal 3 (Week 1): Pt will tolerate 10 mins of activity with single rest break with SaO2 above 90% on RA.   Skilled Therapeutic Interventions/Progress Updates:   Focus of session was gait training and transfer training to bed and to toilet in restroom.  Assisted pt into restroom via w/c and performed stand pivot transfer to toilet with use of grab bars.  Requires mod cues for hand placement to grab bar and also for sequencing of steps to get all the way against toilet.  She requires assist for management of brief and pants as she demonstrates LOB posteriorly when attempting to manage herself.  Remainder of session focused on gait training.  Performed 35' x 1, 15' x 1 and 47' x 1 with RW at min to mod assist throughout, however upon returning to chair tends to have LOB to L side and requires max assist to correct and assist pt remainder of way to chair.  Provided max cues for backing all the way up to chair, and reaching back slowly with one hand at a time for the w/c and to keep trunk forward when sitting.  Ambulated on 2L O2 initially with SaO2 at 96% following first two ambulation trials.  Decreased O2 to 1L with SaO2 at 88% following third trial.  Cues for pursed lip breathing, however pt only able to increase sats to 90%, therefore increased O2 back to 2L.  RN made aware.  Assisted pt back to room and into bed with cues for technique, assist for BLEs into bed and also for continued breathing, as she continues to hold breath during transitional movements.  Left in bed with bed alarm set with nurse tech in room checking vitals.     Therapy Documentation Precautions:  Precautions Precautions: Fall Precaution Comments: moderate arthritis in bilateral shoulders Restrictions Weight Bearing Restrictions: No   Pain: Pain Assessment Pain Assessment: No/denies pain Locomotion : Ambulation Ambulation/Gait Assistance: 2: Max assist   See FIM for current functional status  Therapy/Group: Individual Therapy  Vista Deck 10/20/2012, 4:28 PM

## 2012-10-20 NOTE — Procedures (Signed)
Successful US guided paracentesis from LLQ.  Yielded 4.2L of bloody ascitic fluid.  No immediate complications.  Pt tolerated well.   Specimen was not sent for labs.  Brayton El PA-C 10/20/2012 11:24 AM

## 2012-10-20 NOTE — Progress Notes (Signed)
Pt returned from paracentesis procedure; VSS, alert and oriented x4;denies pain, site CDI.

## 2012-10-20 NOTE — Progress Notes (Signed)
Speech Language Pathology Daily Session Note  Patient Details  Name: [name] MRN: [id number] Date of Birth: 1922-04-11  Today's Date: 10/20/2012 Time: 7829-5621 Time Calculation (min): 55 min  Short Term Goals: Week 1: SLP Short Term Goal 1 (Week 1): Patient will utilize word finding strategies with Mod I  SLP Short Term Goal 2 (Week 1): Patient will consume Dys.2 textures and thin liquids with minimal overt s/s of aspiration with Supervision verbal cues.  Skilled Therapeutic Interventions: Skilled treatment session focused on addressing anomia and dysphagia goals.  SLP facilitated session with set-up of breakfast and Supervision verbal cues to utilize recommended safe swallow strategies.  Patient demonstrated intermittent throat clears with all consistencies consumed during session and with increased wait time performs a second swallow which SLP suspects clears known residue observed on MBS.  As a result, it is recommended that this patient initiate thin liquids and continue with Dys.2 textures.  Patient demonstrated semantic naming errors and moments of anomia during session.  SLP facilitated session with repeats for increased awareness, which resulted in the patient self-correcting errors.  Patient did not carryover word finding strategies during conversation, but was able to express basic wants and needs.       FIM:  Comprehension Comprehension Mode: Auditory Comprehension: 5-Understands complex 90% of the time/Cues < 10% of the time Expression Expression Mode: Verbal Expression: 5-Expresses complex 90% of the time/cues < 10% of the time Social Interaction Social Interaction: 5-Interacts appropriately 90% of the time - Needs monitoring or encouragement for participation or interaction. Problem Solving Problem Solving: 5-Solves complex 90% of the time/cues < 10% of the time Memory Memory: 5-Recognizes or recalls 90% of the time/requires cueing < 10% of the time FIM -  Eating Eating Activity: 5: Supervision/cues  Pain Pain Assessment Pain Assessment:No  Therapy/Group: Individual Therapy  Charlane Ferretti., CCC-SLP [phone]  [name] 10/20/2012, 1:11 PM

## 2012-10-20 NOTE — Progress Notes (Signed)
Occupational Therapy Session Note  Patient Details  Name: LAVERN CRIMI MRN: [id number] Date of Birth: December 02, 1922  Today's Date: 10/20/2012 Time: 0930-1025 Time Calculation (min): 55 min  Short Term Goals: Week 1:  OT Short Term Goal 1 (Week 1): Pt will perform LB bathing with supervision using AE. OT Short Term Goal 2 (Week 1): Pt will perform LB dressing with min assist using AE sit to stand. OT Short Term Goal 3 (Week 1): Pt will perform toilet transfer with min guard assist using 4 wheeled walker OT Short Term Goal 4 (Week 1): Pt will manage clothing during toileting tasks with min assist. OT Short Term Goal 5 (Week 1): Pt will donn pulllover or button up shirt with min assist.  Skilled Therapeutic Interventions/Progress Updates:    Pt transferred from wheelchair to shower seat with min assist using rails in the shower.  She needed max assist to remove incontinence brief and button up gown.  Performed shower sit to stand with min assist.  Provided her with a long handle sponge to assist with washing her LEs as well as her shoulders.  She did need assist for through washing of her peri area.  Transferred back to the wheelchair and pt rolled to the sink to brush her hair and teeth.  She was able to utilize a long handle brush she brought from home to brush her hair.  Needed setup for brushing her teeth secondary to not being able to reach her items on the sink.  Pt did not perform dressing this session as she was going for a procedure.  She transferred back to bed using her rollator with min assist and to supine with max assist.  She was unable to lay down on her left side and lift her LEs into the bed without therapist assisting.  Therapy Documentation Precautions:  Precautions Precautions: Fall Precaution Comments: moderate arthritis in bilateral shoulders Restrictions Weight Bearing Restrictions: No  Vital Signs: Therapy Vitals BP: 107/69 mmHg Patient Position, if appropriate:  Lying Pain: Pain Assessment Pain Assessment: Faces Faces Pain Scale: Hurts a little bit Pain Type: Chronic pain Pain Location: Shoulder Pain Orientation: Right Pain Intervention(s): Repositioned Multiple Pain Sites: No ADL: See FIM for current functional status  Therapy/Group: Individual Therapy  Brennen Camper OTR/L 10/20/2012, 12:12 PM

## 2012-10-20 NOTE — Progress Notes (Signed)
Occupational Therapy Session Note  Patient Details  Name: [name] MRN: [id number] Date of Birth: [birth year]  Today's Date: 10/20/2012 Time: 4098-1191 Time Calculation (min): 38 min  Short Term Goals: Week 1:  OT Short Term Goal 1 (Week 1): Pt will perform LB bathing with supervision using AE. OT Short Term Goal 2 (Week 1): Pt will perform LB dressing with min assist using AE sit to stand. OT Short Term Goal 3 (Week 1): Pt will perform toilet transfer with min guard assist using 4 wheeled walker OT Short Term Goal 4 (Week 1): Pt will manage clothing during toileting tasks with min assist. OT Short Term Goal 5 (Week 1): Pt will donn pulllover or button up shirt with min assist.  Skilled Therapeutic Interventions/Progress Updates:    Pt worked on dressing during session since she was unable to perform the task earlier in the morning.  Utilized reacher for donning pants over her feet as well as removing socks initially.  Needed min assist for sit to stand and mod facilitation to pull her pants over her hips.  She needed multiple attempts to donn her front fastening bra and eventually was able to get it fastened herself and over her right shoulder.  She needed assistance with getting her left arm in and over her shoulder.  Pt donned pullover shirt as well and needed assistance with donning over the LUE and buttoning the buttons.  Pt reported using a button hook at home.  Therapy Documentation Precautions:  Precautions Precautions: Fall Precaution Comments: moderate arthritis in bilateral shoulders Restrictions Weight Bearing Restrictions: No  Pain: Pain Assessment Pain Assessment: No/denies pain ADL: See FIM for current functional status  Therapy/Group: Individual Therapy  Talli Kimmer OTR/L 10/20/2012, 3:33 PM

## 2012-10-20 NOTE — Progress Notes (Signed)
Patient ID: [name], female   DOB: Mar 23, 1922, 79 y.o.   MRN: [id number] 79 y.o. right-handed female with history of metastatic adenocarcinoma diagnosed 2012.previous chemotherapy treatments but stopped due to progression of her cancer, malignant ascites, diabetes mellitus peripheral neuropathy. Patient independent with rolling walker prior to admission. Admitted 10/13/2012 after a fall with noted slurred speech and left-sided weakness. MRI of the brain showed scattered small acute posterior right MCA territory infarcts. Several tiny acute infarcts also in the posterior left MCA territory. MRA of the head negative. Echocardiogram with ejection fraction of 55% normal systolic function. Carotid Dopplers with no ICA stenosis. Patient did not receive TPA. Neurology services consulted placed on aspirin therapy for CVA prophylaxis. Speech therapy for dysphagia currently maintained on a dysphagia 2 thin liquid diet. Noted chronic anemia hemoglobin 6.8 patient has been transfused with latest hemoglobin 10.1. Patient is being followed by hospice care for history of progressive metastatic adenocarcinoma. She does undergo weekly therapeutic paracentesis for chronic ascites and followed by oncology services Dr. Arnoldo Lenis. Subjective/Complaints: No problems overnite Undergoing ultrasound guided paracentesis Objective: Vital Signs: Blood pressure 135/75, pulse 96, temperature 97.5 F (36.4 C), temperature source Oral, resp. rate 19, height 5\' 1"  (1.549 m), weight 77.3 kg (170 lb 6.7 oz), SpO2 100.00%. US Paracentesis  10/20/2012   *RADIOLOGY REPORT*  Clinical Data: Metastatic adenocarcinoma, peritoneal carcinomatosis.  Recurrent ascites.  Request for therapeutic paracentesis  ULTRASOUND GUIDED PARACENTESIS  Comparison:  Previous paracentesis  An ultrasound guided paracentesis was thoroughly discussed with the patient and questions answered.  The benefits, risks, alternatives and complications were also  discussed.  The patient understands and wishes to proceed with the procedure.  Written consent was obtained.  Ultrasound was performed to localize and mark an adequate pocket of fluid in the left lower quadrant of the abdomen.  The area was then prepped and draped in the normal sterile fashion.  1% Lidocaine was used for local anesthesia.  Under ultrasound guidance a 19 gauge Yueh catheter was introduced.  Paracentesis was performed.  The catheter was removed and a dressing applied.  Complications:  None  Findings:  A total of approximately 4.2 liters of bloody ascitic fluid was removed.  A fluid sample was not sent for laboratory analysis.  IMPRESSION: Successful ultrasound guided paracentesis yielding 4.2 liters of ascites.  Read by: Brayton El, P.A,-C   Original Report Authenticated By: Richarda Overlie, M.D.   Results for orders placed during the hospital encounter of 10/18/12 (from the past 72 hour(s))  GLUCOSE, CAPILLARY     Status: Abnormal   Collection Time    10/18/12  9:42 PM      Result Value Range   Glucose-Capillary 177 (*) 70 - 99 mg/dL  CBC WITH DIFFERENTIAL     Status: Abnormal   Collection Time    10/19/12  7:05 AM      Result Value Range   WBC 9.5  4.0 - 10.5 K/uL   RBC 4.36  3.87 - 5.11 MIL/uL   Hemoglobin 9.9 (*) 12.0 - 15.0 g/dL   HCT 40.9 (*) 81.1 - 91.4 %   MCV 78.7  78.0 - 100.0 fL   MCH 22.7 (*) 26.0 - 34.0 pg   MCHC 28.9 (*) 30.0 - 36.0 g/dL   RDW 78.2 (*) 95.6 - 21.3 %   Platelets 327  150 - 400 K/uL   Neutrophils Relative % 66  43 - 77 %   Lymphocytes Relative 17  12 - 46 %  Monocytes Relative 12  3 - 12 %   Eosinophils Relative 4  0 - 5 %   Basophils Relative 1  0 - 1 %   Neutro Abs 6.3  1.7 - 7.7 K/uL   Lymphs Abs 1.6  0.7 - 4.0 K/uL   Monocytes Absolute 1.1 (*) 0.1 - 1.0 K/uL   Eosinophils Absolute 0.4  0.0 - 0.7 K/uL   Basophils Absolute 0.1  0.0 - 0.1 K/uL   RBC Morphology POLYCHROMASIA PRESENT    COMPREHENSIVE METABOLIC PANEL     Status: Abnormal    Collection Time    10/19/12  7:05 AM      Result Value Range   Sodium 141  135 - 145 mEq/L   Potassium 5.1  3.5 - 5.1 mEq/L   Chloride 104  96 - 112 mEq/L   CO2 31  19 - 32 mEq/L   Glucose, Bld 120 (*) 70 - 99 mg/dL   BUN 18  6 - 23 mg/dL   Creatinine, Ser 1.61  0.50 - 1.10 mg/dL   Calcium 9.2  8.4 - 09.6 mg/dL   Total Protein 6.4  6.0 - 8.3 g/dL   Albumin 2.2 (*) 3.5 - 5.2 g/dL   AST 14  0 - 37 U/L   ALT 10  0 - 35 U/L   Alkaline Phosphatase 55  39 - 117 U/L   Total Bilirubin 0.1 (*) 0.3 - 1.2 mg/dL   GFR calc non Af Amer 60 (*) >90 mL/min   GFR calc Af Amer 70 (*) >90 mL/min   Comment: (NOTE)     The eGFR has been calculated using the CKD EPI equation.     This calculation has not been validated in all clinical situations.     eGFR's persistently <90 mL/min signify possible Chronic Kidney     Disease.  GLUCOSE, CAPILLARY     Status: None   Collection Time    10/19/12  7:24 AM      Result Value Range   Glucose-Capillary 94  70 - 99 mg/dL   Comment 1 Notify RN    GLUCOSE, CAPILLARY     Status: Abnormal   Collection Time    10/19/12 11:31 AM      Result Value Range   Glucose-Capillary 190 (*) 70 - 99 mg/dL   Comment 1 Notify RN    GLUCOSE, CAPILLARY     Status: Abnormal   Collection Time    10/19/12  4:55 PM      Result Value Range   Glucose-Capillary 147 (*) 70 - 99 mg/dL  GLUCOSE, CAPILLARY     Status: Abnormal   Collection Time    10/19/12  8:46 PM      Result Value Range   Glucose-Capillary 148 (*) 70 - 99 mg/dL  GLUCOSE, CAPILLARY     Status: Abnormal   Collection Time    10/20/12  7:22 AM      Result Value Range   Glucose-Capillary 127 (*) 70 - 99 mg/dL   Comment 1 Notify RN       HEENT: normal and no drainage Cardio: RRR and no murmurs Resp: CTA B/L and unlabored GI: BS positive, mild distension Extremity:  Pulses positive and No Edema Skin:   Intact Neuro: Alert/Oriented, Flat, Normal Sensory and Normal Motor Musc/Skel:  Normal Gen  NAD   Assessment/Plan: 1. Functional deficits secondary to Post R MCA infarct which require 3+ hours per day of interdisciplinary therapy in a comprehensive inpatient rehab  setting. Physiatrist is providing close team supervision and 24 hour management of active medical problems listed below. Physiatrist and rehab team continue to assess barriers to discharge/monitor patient progress toward functional and medical goals. FIM: FIM - Bathing Bathing Steps Patient Completed: Chest;Right Arm;Left Arm;Abdomen;Right upper leg;Left upper leg;Right lower leg (including foot);Left lower leg (including foot) Bathing: 4: Min-Patient completes 8-9 28f 10 parts or 75+ percent  FIM - Upper Body Dressing/Undressing Upper body dressing/undressing steps patient completed: Hook/unhook bra;Thread/unthread right bra strap;Thread/unthread right sleeve of pullover shirt/dresss Upper body dressing/undressing: 3: Mod-Patient completed 50-74% of tasks FIM - Lower Body Dressing/Undressing Lower body dressing/undressing steps patient completed: Thread/unthread right pants leg;Thread/unthread left pants leg Lower body dressing/undressing: 2: Max-Patient completed 25-49% of tasks  FIM - Toileting Toileting steps completed by patient: Performs perineal hygiene Toileting Assistive Devices: Grab bar or rail for support Toileting: 2: Max-Patient completed 1 of 3 steps  FIM - Diplomatic Services operational officer Devices: Grab bars Toilet Transfers: 3-To toilet/BSC: Mod A (lift or lower assist);3-From toilet/BSC: Mod A (lift or lower assist)  FIM - Bed/Chair Transfer Bed/Chair Transfer Assistive Devices: Walker Bed/Chair Transfer: 3: Sit > Supine: Mod A (lifting assist/Pt. 50-74%/lift 2 legs);3: Bed > Chair or W/C: Mod A (lift or lower assist)  FIM - Locomotion: Wheelchair Distance: 10' Locomotion: Wheelchair: 1: Travels less than 50 ft with moderate assistance (Pt: 50 - 74%) FIM - Locomotion:  Ambulation Locomotion: Ambulation Assistive Devices: Designer, industrial/product Ambulation/Gait Assistance: 2: Max assist Locomotion: Ambulation: 1: Travels less than 50 ft with maximal assistance (Pt: 25 - 49%)  Comprehension Comprehension Mode: Auditory Comprehension: 5-Understands complex 90% of the time/Cues < 10% of the time  Expression Expression Mode: Verbal Expression: 5-Expresses complex 90% of the time/cues < 10% of the time  Social Interaction Social Interaction: 5-Interacts appropriately 90% of the time - Needs monitoring or encouragement for participation or interaction.  Problem Solving Problem Solving: 5-Solves complex 90% of the time/cues < 10% of the time  Memory Memory: 5-Recognizes or recalls 90% of the time/requires cueing < 10% of the time   Medical Problem List and Plan:  1. Embolic bi cerebral infarcts  2. DVT Prophylaxis/Anticoagulation: SCDs. Monitor for any signs of DVT  3. Pain Management: Duragesic patch 25 mcg change every 72 hours, Lidoderm patch, Ultram as needed. Monitor with increased activity  4. Mood: Wellbutrin 100 mg daily, Effexor 75 mg daily. Provide emotional support  5. Neuropsych: This patient is capable of making decisions on her own behalf.  6. Metastatic adenocarcinoma with malignant ascites. Followup oncology services.  Patient receives weekly paracentesis for malignant ascites per interventional radiology with last paracentesis 10/13/2012---4.2 Litres removed today 7. Dysphagia. Dysphagia 2 nectar liquids. Followup speech therapy. Monitor for any aspiration  8. Chronic anemia. Patient has been transfused. Check Hemoccults stools. Followup CBC  9. Hypertension. Coreg 3.125 mg twice a day. Monitor with increased activity  10. Obstructive sleep apnea. Continue CPAP  11. Diabetes mellitus with peripheral neuropathy. She is presently off her Glucophage 500 mg twice a day as prior to admission. Hemoglobin A1c 6.2. Check blood sugars a.c. and at  bedtime     LOS (Days) 2 A FACE TO FACE EVALUATION WAS PERFORMED  Ondria Oswald E 10/20/2012, 6:26 PM

## 2012-10-21 ENCOUNTER — Inpatient Hospital Stay (HOSPITAL_COMMUNITY): Admitting: Physical Therapy

## 2012-10-21 ENCOUNTER — Inpatient Hospital Stay (HOSPITAL_COMMUNITY): Admitting: Occupational Therapy

## 2012-10-21 ENCOUNTER — Inpatient Hospital Stay (HOSPITAL_COMMUNITY): Admitting: Speech Pathology

## 2012-10-21 DIAGNOSIS — I6330 Cerebral infarction due to thrombosis of unspecified cerebral artery: Secondary | ICD-10-CM

## 2012-10-21 MED ORDER — SENNOSIDES-DOCUSATE SODIUM 8.6-50 MG PO TABS
2.0000 | ORAL_TABLET | Freq: Every day | ORAL | Status: DC
  Administered 2012-10-21: 2 via ORAL
  Filled 2012-10-21: qty 2

## 2012-10-21 MED ORDER — BISACODYL 10 MG RE SUPP
10.0000 mg | Freq: Every day | RECTAL | Status: DC | PRN
  Administered 2012-10-21: 10 mg via RECTAL
  Filled 2012-10-21 (×2): qty 1

## 2012-10-21 NOTE — Progress Notes (Signed)
Pt was nauseated after lunch and again at 1600.  Pt reports, "I usually drink ginger ale when home if I am sick".  Ginger ale was offered and taken.  Zofran administered as ordered.  Will continue to monitor for nausea and other signs of a medical emergency.[name] D

## 2012-10-21 NOTE — Progress Notes (Signed)
Physical Therapy Session Note  Patient Details  Name: [name] MRN: [id number] Date of Birth: [birth year]  Today's Date: 10/21/2012 Time: 1445-1530 Time Calculation (min): 45 min  Short Term Goals: Week 1:  PT Short Term Goal 1 (Week 1): Pt will perform bed mobility at min assist  PT Short Term Goal 2 (Week 1): Pt will perform transfers at min assist PT Short Term Goal 3 (Week 1): Pt will tolerate 10 mins of activity with single rest break with SaO2 above 90% on RA.   Skilled Therapeutic Interventions/Progress Updates:   Pt asleep in recliner.  Pt denies pain but does report mild nausea.  As pt begins to sit up from recliner pt reports increasing nausea and requests bucket.  Pt unable to vomit and requests to go to toilet.  Performed transfer recliner > toilet with RW ambulating with mod A to safely navigate with RW around obstacles and for balance.  Pt able to stand and doff clothing and perform hygiene seated on toilet.  Performs sit > stand from toilet with mod A and requires assistance to don clothing.  Pt unable to ambulate out to recliner and returns to sitting on toilet secondary to increasing nausea and begins to vomit repeatedly into bucket while seated on toilet.  RN notified.  Once vomiting ceased pt assisted from toilet back to recliner with RW and mod A and set up with bucket and wash cloths.  Pt did not vomit again while therapist in room.  Will attempt again tomorrow.  Therapy Documentation Precautions:  Precautions Precautions: Fall Precaution Comments: moderate arthritis in bilateral shoulders Restrictions Weight Bearing Restrictions: No Vital Signs: Therapy Vitals Temp: 98.3 F (36.8 C) Temp src: Axillary Pulse Rate: 115 Resp: 19 BP: 117/67 mmHg Patient Position, if appropriate: Sitting Oxygen Therapy SpO2: 94 % O2 Device: Nasal cannula O2 Flow Rate (L/min): 2 L/min Pain: Pain Assessment Pain Assessment: No/denies pain Locomotion  : Ambulation Ambulation/Gait Assistance: 3: Mod assist   See FIM for current functional status  Therapy/Group: Individual Therapy  Edman Circle [facility] 10/21/2012, 3:33 PM

## 2012-10-21 NOTE — Progress Notes (Signed)
Physical Therapy Note  Patient Details  Name: [name] MRN: [id number] Date of Birth: 08-05-1922 Today's Date: 10/21/2012  1100-1155 (55 minutes) individual Pain: no reported pain Other: Oxygen sats on 2 L [name]  Pulse 124 Focus of treatment: Gait training/endurance Treatment: Pt in wc upon arrival; right brake not locking completely ; switched pt to new wc; gait 40 feet, 70 feet rollator min to close SBA with increased time (Oxygen sats > 92% on 2 L , pulse 128 post gait. Pt requires reminder cues for reaching back before sitting.    Phila Shoaf,JIM 10/21/2012, 11:52 AM

## 2012-10-21 NOTE — Progress Notes (Signed)
At 1700, pt continues to feel nauseated.  Contacted physician.  Orders to address constipation initiated.  Will continue to monitor for nausea and safety.Oretha Milch D

## 2012-10-21 NOTE — Progress Notes (Signed)
Patient ID: [name], female   DOB: Nov 21, 1922, 79 y.o.   MRN: [id number] 79 y.o. right-handed female with history of metastatic adenocarcinoma diagnosed 2012.previous chemotherapy treatments but stopped due to progression of her cancer, malignant ascites, diabetes mellitus peripheral neuropathy. Patient independent with rolling walker prior to admission. Admitted 10/13/2012 after a fall with noted slurred speech and left-sided weakness. MRI of the brain showed scattered small acute posterior right MCA territory infarcts. Several tiny acute infarcts also in the posterior left MCA territory. MRA of the head negative. Echocardiogram with ejection fraction of 55% normal systolic function. Carotid Dopplers with no ICA stenosis. Patient did not receive TPA. Neurology services consulted placed on aspirin therapy for CVA prophylaxis. Speech therapy for dysphagia currently maintained on a dysphagia 2 thin liquid diet. Noted chronic anemia hemoglobin 6.8 patient has been transfused with latest hemoglobin 10.1. Patient is being followed by hospice care for history of progressive metastatic adenocarcinoma. She does undergo weekly therapeutic paracentesis for chronic ascites and followed by oncology services Dr. Arnoldo Lenis.   Subjective/Complaints: Nauseas with vomiting this am. No problems last night. Is constipated. Undergoing ultrasound guided paracentesis Objective: Vital Signs: Blood pressure 125/75, pulse 108, temperature 97.6 F (36.4 C), temperature source Oral, resp. rate 19, height 5\' 1"  (1.549 m), weight 77.3 kg (170 lb 6.7 oz), SpO2 93.00%. US Paracentesis  10/20/2012   *RADIOLOGY REPORT*  Clinical Data: Metastatic adenocarcinoma, peritoneal carcinomatosis.  Recurrent ascites.  Request for therapeutic paracentesis  ULTRASOUND GUIDED PARACENTESIS  Comparison:  Previous paracentesis  An ultrasound guided paracentesis was thoroughly discussed with the patient and questions answered.  The benefits,  risks, alternatives and complications were also discussed.  The patient understands and wishes to proceed with the procedure.  Written consent was obtained.  Ultrasound was performed to localize and mark an adequate pocket of fluid in the left lower quadrant of the abdomen.  The area was then prepped and draped in the normal sterile fashion.  1% Lidocaine was used for local anesthesia.  Under ultrasound guidance a 19 gauge Yueh catheter was introduced.  Paracentesis was performed.  The catheter was removed and a dressing applied.  Complications:  None  Findings:  A total of approximately 4.2 liters of bloody ascitic fluid was removed.  A fluid sample was not sent for laboratory analysis.  IMPRESSION: Successful ultrasound guided paracentesis yielding 4.2 liters of ascites.  Read by: Brayton El, P.A,-C   Original Report Authenticated By: Richarda Overlie, M.D.   Results for orders placed during the hospital encounter of 10/18/12 (from the past 72 hour(s))  GLUCOSE, CAPILLARY     Status: Abnormal   Collection Time    10/18/12  9:42 PM      Result Value Range   Glucose-Capillary 177 (*) 70 - 99 mg/dL  CBC WITH DIFFERENTIAL     Status: Abnormal   Collection Time    10/19/12  7:05 AM      Result Value Range   WBC 9.5  4.0 - 10.5 K/uL   RBC 4.36  3.87 - 5.11 MIL/uL   Hemoglobin 9.9 (*) 12.0 - 15.0 g/dL   HCT 11.9 (*) 14.7 - 82.9 %   MCV 78.7  78.0 - 100.0 fL   MCH 22.7 (*) 26.0 - 34.0 pg   MCHC 28.9 (*) 30.0 - 36.0 g/dL   RDW 56.2 (*) 13.0 - 86.5 %   Platelets 327  150 - 400 K/uL   Neutrophils Relative % 66  43 - 77 %  Lymphocytes Relative 17  12 - 46 %   Monocytes Relative 12  3 - 12 %   Eosinophils Relative 4  0 - 5 %   Basophils Relative 1  0 - 1 %   Neutro Abs 6.3  1.7 - 7.7 K/uL   Lymphs Abs 1.6  0.7 - 4.0 K/uL   Monocytes Absolute 1.1 (*) 0.1 - 1.0 K/uL   Eosinophils Absolute 0.4  0.0 - 0.7 K/uL   Basophils Absolute 0.1  0.0 - 0.1 K/uL   RBC Morphology POLYCHROMASIA PRESENT     COMPREHENSIVE METABOLIC PANEL     Status: Abnormal   Collection Time    10/19/12  7:05 AM      Result Value Range   Sodium 141  135 - 145 mEq/L   Potassium 5.1  3.5 - 5.1 mEq/L   Chloride 104  96 - 112 mEq/L   CO2 31  19 - 32 mEq/L   Glucose, Bld 120 (*) 70 - 99 mg/dL   BUN 18  6 - 23 mg/dL   Creatinine, Ser 4.54  0.50 - 1.10 mg/dL   Calcium 9.2  8.4 - 09.8 mg/dL   Total Protein 6.4  6.0 - 8.3 g/dL   Albumin 2.2 (*) 3.5 - 5.2 g/dL   AST 14  0 - 37 U/L   ALT 10  0 - 35 U/L   Alkaline Phosphatase 55  39 - 117 U/L   Total Bilirubin 0.1 (*) 0.3 - 1.2 mg/dL   GFR calc non Af Amer 60 (*) >90 mL/min   GFR calc Af Amer 70 (*) >90 mL/min   Comment: (NOTE)     The eGFR has been calculated using the CKD EPI equation.     This calculation has not been validated in all clinical situations.     eGFR's persistently <90 mL/min signify possible Chronic Kidney     Disease.  GLUCOSE, CAPILLARY     Status: None   Collection Time    10/19/12  7:24 AM      Result Value Range   Glucose-Capillary 94  70 - 99 mg/dL   Comment 1 Notify RN    GLUCOSE, CAPILLARY     Status: Abnormal   Collection Time    10/19/12 11:31 AM      Result Value Range   Glucose-Capillary 190 (*) 70 - 99 mg/dL   Comment 1 Notify RN    GLUCOSE, CAPILLARY     Status: Abnormal   Collection Time    10/19/12  4:55 PM      Result Value Range   Glucose-Capillary 147 (*) 70 - 99 mg/dL  GLUCOSE, CAPILLARY     Status: Abnormal   Collection Time    10/19/12  8:46 PM      Result Value Range   Glucose-Capillary 148 (*) 70 - 99 mg/dL  GLUCOSE, CAPILLARY     Status: Abnormal   Collection Time    10/20/12  7:22 AM      Result Value Range   Glucose-Capillary 127 (*) 70 - 99 mg/dL   Comment 1 Notify RN       HEENT: normal and no drainage Cardio: RRR and no murmurs Resp: CTA B/L and unlabored GI: BS positive, mild distension Extremity:  Pulses positive and No Edema Skin:   Intact Neuro: Alert/Oriented, Flat, Normal Sensory  and Normal Motor Musc/Skel:  Normal Gen NAD   Assessment/Plan: 1. Functional deficits secondary to Post R MCA infarct which require 3+ hours  per day of interdisciplinary therapy in a comprehensive inpatient rehab setting. Physiatrist is providing close team supervision and 24 hour management of active medical problems listed below. Physiatrist and rehab team continue to assess barriers to discharge/monitor patient progress toward functional and medical goals. FIM: FIM - Bathing Bathing Steps Patient Completed: Chest;Right Arm;Left Arm;Abdomen;Right upper leg;Left upper leg;Right lower leg (including foot);Left lower leg (including foot) Bathing: 4: Min-Patient completes 8-9 50f 10 parts or 75+ percent  FIM - Upper Body Dressing/Undressing Upper body dressing/undressing steps patient completed: Hook/unhook bra;Thread/unthread right bra strap;Thread/unthread right sleeve of pullover shirt/dresss Upper body dressing/undressing: 3: Mod-Patient completed 50-74% of tasks FIM - Lower Body Dressing/Undressing Lower body dressing/undressing steps patient completed: Thread/unthread right pants leg;Thread/unthread left pants leg Lower body dressing/undressing: 2: Max-Patient completed 25-49% of tasks  FIM - Toileting Toileting steps completed by patient: Performs perineal hygiene Toileting Assistive Devices: Grab bar or rail for support Toileting: 2: Max-Patient completed 1 of 3 steps  FIM - Diplomatic Services operational officer Devices: Grab bars Toilet Transfers: 3-To toilet/BSC: Mod A (lift or lower assist);3-From toilet/BSC: Mod A (lift or lower assist)  FIM - Bed/Chair Transfer Bed/Chair Transfer Assistive Devices: Walker Bed/Chair Transfer: 3: Sit > Supine: Mod A (lifting assist/Pt. 50-74%/lift 2 legs);3: Bed > Chair or W/C: Mod A (lift or lower assist)  FIM - Locomotion: Wheelchair Distance: 10' Locomotion: Wheelchair: 1: Travels less than 50 ft with moderate assistance (Pt: 50 -  74%) FIM - Locomotion: Ambulation Locomotion: Ambulation Assistive Devices: Designer, industrial/product Ambulation/Gait Assistance: 2: Max assist Locomotion: Ambulation: 1: Travels less than 50 ft with maximal assistance (Pt: 25 - 49%)  Comprehension Comprehension Mode: Auditory Comprehension: 5-Understands complex 90% of the time/Cues < 10% of the time  Expression Expression Mode: Verbal Expression: 5-Expresses complex 90% of the time/cues < 10% of the time  Social Interaction Social Interaction: 5-Interacts appropriately 90% of the time - Needs monitoring or encouragement for participation or interaction.  Problem Solving Problem Solving: 5-Solves complex 90% of the time/cues < 10% of the time  Memory Memory: 5-Recognizes or recalls 90% of the time/requires cueing < 10% of the time   Medical Problem List and Plan:  1. Embolic bi cerebral infarcts  2. DVT Prophylaxis/Anticoagulation: SCDs. Monitor for any signs of DVT  3. Pain Management: Duragesic patch 25 mcg change every 72 hours, Lidoderm patch, Ultram as needed. Monitor with increased activity  4. Mood: Wellbutrin 100 mg daily, Effexor 75 mg daily. Provide emotional support  5. Neuropsych: This patient is capable of making decisions on her own behalf.  6. Metastatic adenocarcinoma with malignant ascites. Followup oncology services.  Patient receives weekly paracentesis for malignant ascites per interventional radiology--paracentesis yesterday 7. Dysphagia. Dysphagia 2 nectar liquids. Followup speech therapy. Monitor for any aspiration  8. Chronic anemia. Patient has been transfused.following clinically --9.9 9. Hypertension. Coreg 3.125 mg twice a day. Monitor with increased activity  10. Obstructive sleep apnea. Continue CPAP  11. Diabetes mellitus with peripheral neuropathy. She is presently off her Glucophage 500 mg twice a day as prior to admission. Hemoglobin A1c 6.2. Check blood sugars a.c. and at bedtime  12. Constipation and  likely associated nausea---augment bowel regimen today  -sorbitol today, scheduled senokot s    LOS (Days) 3 A FACE TO FACE EVALUATION WAS PERFORMED  SWARTZ,ZACHARY T 10/21/2012, 9:13 AM

## 2012-10-21 NOTE — Progress Notes (Signed)
Speech Language Pathology Daily Session Note  Patient Details  Name: [name] MRN: [id number] Date of Birth: 1922/09/18  Today's Date: 10/21/2012 Time: 0900-0930 Time Calculation (min): 30 min  Short Term Goals: Week 1: SLP Short Term Goal 1 (Week 1): Patient will utilize word finding strategies with Mod I  SLP Short Term Goal 2 (Week 1): Patient will consume Dys.2 textures and thin liquids with minimal overt s/s of aspiration with Supervision verbal cues.  Skilled Therapeutic Interventions: Therapeutic intervention complete, targeting expressive language therapy and auditory comprehension.    She required min A to use strategies to increase word recall and mod A in order to correctly respond questions.  Continue with current treatment plan.    FIM:  Expression Expression Mode: Verbal Expression: 4-Expresses basic 75 - 89% of the time/requires cueing 10 - 24% of the time. Needs helper to occlude trach/needs to repeat words.  Pain Pain Assessment Pain Assessment: No/denies pain Pain Score: 0-No pain PAINAD (Pain Assessment in Advanced Dementia) Breathing: normal  Therapy/Group: Individual Therapy  Lenny Pastel 10/21/2012, 12:59 PM

## 2012-10-21 NOTE — Progress Notes (Signed)
Occupational Therapy Session Note  Patient Details  Name: [name] MRN: [id number] Date of Birth: 1922-12-17  Today's Date: 10/21/2012 Time: 1191-4782 Time Calculation (min): 70 min  Skilled Therapeutic Interventions/Progress Updates: ADL in shower via grab bars and shower chair with focus on AROM within pain tolerance to wash hair and rest of body, along with dynamic standing balance to wash and dress periarea.  Patient required extra time for functional mobility for clothing retrieval and shower transfer due to what she said was bilateral pain in shoulders when bearing through them to use walker and to use arms as less painfully as possible.  Patient required Max Assist for standing tasks to wash and dress periarea/buttocks.  Patient disoriented to situation at times.  Patient very humorous in conversation as well.    Therapy Documentation Precautions:  Precautions Precautions: Fall Precaution Comments: moderate arthritis in bilateral shoulders Restrictions Weight Bearing Restrictions: No  Pain:7/10 bilateral shoulders Pain Assessment Pain Assessment: No/denies pain     See FIM for current functional status  Therapy/Group: Individual Therapy  Bud Face [facility] 10/21/2012, 5:12 PM

## 2012-10-22 ENCOUNTER — Inpatient Hospital Stay (HOSPITAL_COMMUNITY)

## 2012-10-22 ENCOUNTER — Inpatient Hospital Stay (HOSPITAL_COMMUNITY): Admitting: Occupational Therapy

## 2012-10-22 MED ORDER — ONDANSETRON 8 MG/NS 50 ML IVPB
8.0000 mg | INTRAVENOUS | Status: DC | PRN
  Administered 2012-10-22 – 2012-10-26 (×2): 8 mg via INTRAVENOUS
  Filled 2012-10-22 (×4): qty 8

## 2012-10-22 MED ORDER — ONDANSETRON HCL 4 MG/2ML IJ SOLN: 8.0000 mg | INTRAMUSCULAR | Status: DC | PRN

## 2012-10-22 MED ORDER — METOCLOPRAMIDE HCL 5 MG/ML IJ SOLN
10.0000 mg | Freq: Four times a day (QID) | INTRAMUSCULAR | Status: DC
  Administered 2012-10-23 – 2012-10-24 (×6): 10 mg via INTRAVENOUS
  Filled 2012-10-22 (×10): qty 2

## 2012-10-22 NOTE — Progress Notes (Signed)
At 2032 patient vomiting bowel 8 times after soap suds enema given. No results from enema. Liquid from rectum clear. Patients abdomen distended /soft. Dr. Riley Kill notified. Orders for  Reglan 10 mg IV Q 6 hrs given. Patients diet changed to clear liquid. adm

## 2012-10-22 NOTE — Progress Notes (Signed)
At 1700 patient waiting for IV team to establish access.  IV access established.  IV Zofran started.  Pt and daughter have now agreed to have SSE if nausea subsides.Oretha Milch D

## 2012-10-22 NOTE — Progress Notes (Signed)
Patient [name] 9/2. Patient agreed to take a PRN laxative, Ducolax supp. Given at 2100, and assisted patient up to the [facility] with no bowel movement noted after 30 min and digital stimulation x 2 performed. Patient requested to get back in the bed in order to go to sleep. Continue plan of care.

## 2012-10-22 NOTE — Progress Notes (Signed)
Pt was able to have a medium-sized bowel movement at 0830 this morning.  Still experiencing mild nausea.  Meds administered.  Will continue to monitor.Oretha Milch D

## 2012-10-22 NOTE — Progress Notes (Signed)
Occupational Therapy Session Note  Patient Details  Name: [name] MRN: [id number] Date of Birth: 13-Mar-1922  Today's Date: 10/22/2012 Time: 1510-1535 Time Calculation (min): 25 min  Skilled Therapeutic Interventions/Progress Updates: Upon approach for therapy, Patient off unit for abdomenal xray/testing per patient's dtr.  Per dtr patient with increased  "projectile vomiting".  As well, she stated her mom did not feel well.  This clinician returned to patient room later to offer therapy after she was settled back into her room.   Patient c/o of not feeling well and appeared to have dry heaves.   Patient concurred to try tub bench transfer as son-in-law (who is a [employer]) stated the family is trying to determine needs for eventual discharge home.  After bed mobility supine to EOB, patient began vomiting and stated she did not feel well.   This clinician and son in law assisted patient back into her bed and positioned her with HOB elevated.  Patient left with son in law and dtr and was encouraged to press call bell is she needed staff assistance.     Therapy Documentation Precautions:  Precautions Precautions: Fall Precaution Comments: moderate arthritis in bilateral shoulders Restrictions Weight Bearing Restrictions: No  Pain: denied   See FIM for current functional status  Therapy/Group: Individual Therapy  Bud Face [facility] 10/22/2012, 5:57 PM

## 2012-10-22 NOTE — Progress Notes (Addendum)
Patient ID: [name], female   DOB: 20-May-1922, 79 y.o.   MRN: [id number] 79 y.o. right-handed female with history of metastatic adenocarcinoma diagnosed 2012.previous chemotherapy treatments but stopped due to progression of her cancer, malignant ascites, diabetes mellitus peripheral neuropathy. Patient independent with rolling walker prior to admission. Admitted 10/13/2012 after a fall with noted slurred speech and left-sided weakness. MRI of the brain showed scattered small acute posterior right MCA territory infarcts. Several tiny acute infarcts also in the posterior left MCA territory. MRA of the head negative. Echocardiogram with ejection fraction of 55% normal systolic function. Carotid Dopplers with no ICA stenosis. Patient did not receive TPA. Neurology services consulted placed on aspirin therapy for CVA prophylaxis. Speech therapy for dysphagia currently maintained on a dysphagia 2 thin liquid diet. Noted chronic anemia hemoglobin 6.8 patient has been transfused with latest hemoglobin 10.1. Patient is being followed by hospice care for history of progressive metastatic adenocarcinoma. She does undergo weekly therapeutic paracentesis for chronic ascites and followed by oncology services Dr. Arnoldo Lenis.   Subjective/Complaints: Small results with suppository yesterday. Nausea is better though A 12 point review of systems has been performed and if not noted above is otherwise negative. . Objective: Vital Signs: Blood pressure 106/63, pulse 121, temperature 97.7 F (36.5 C), temperature source Axillary, resp. rate 18, height 5\' 1"  (1.549 m), weight 77.3 kg (170 lb 6.7 oz), SpO2 90.00%. US Paracentesis  10/20/2012   *RADIOLOGY REPORT*  Clinical Data: Metastatic adenocarcinoma, peritoneal carcinomatosis.  Recurrent ascites.  Request for therapeutic paracentesis  ULTRASOUND GUIDED PARACENTESIS  Comparison:  Previous paracentesis  An ultrasound guided paracentesis was thoroughly discussed with  the patient and questions answered.  The benefits, risks, alternatives and complications were also discussed.  The patient understands and wishes to proceed with the procedure.  Written consent was obtained.  Ultrasound was performed to localize and mark an adequate pocket of fluid in the left lower quadrant of the abdomen.  The area was then prepped and draped in the normal sterile fashion.  1% Lidocaine was used for local anesthesia.  Under ultrasound guidance a 19 gauge Yueh catheter was introduced.  Paracentesis was performed.  The catheter was removed and a dressing applied.  Complications:  None  Findings:  A total of approximately 4.2 liters of bloody ascitic fluid was removed.  A fluid sample was not sent for laboratory analysis.  IMPRESSION: Successful ultrasound guided paracentesis yielding 4.2 liters of ascites.  Read by: Brayton El, P.A,-C   Original Report Authenticated By: Richarda Overlie, M.D.   Results for orders placed during the hospital encounter of 10/18/12 (from the past 72 hour(s))  GLUCOSE, CAPILLARY     Status: Abnormal   Collection Time    10/19/12 11:31 AM      Result Value Range   Glucose-Capillary 190 (*) 70 - 99 mg/dL   Comment 1 Notify RN    GLUCOSE, CAPILLARY     Status: Abnormal   Collection Time    10/19/12  4:55 PM      Result Value Range   Glucose-Capillary 147 (*) 70 - 99 mg/dL  GLUCOSE, CAPILLARY     Status: Abnormal   Collection Time    10/19/12  8:46 PM      Result Value Range   Glucose-Capillary 148 (*) 70 - 99 mg/dL  GLUCOSE, CAPILLARY     Status: Abnormal   Collection Time    10/20/12  7:22 AM      Result Value Range  Glucose-Capillary 127 (*) 70 - 99 mg/dL   Comment 1 Notify RN       HEENT: normal and no drainage Cardio: RRR and no murmurs Resp: CTA B/L and unlabored GI: BS infrequent but present,  mild distension persistent. Non-tender Extremity:  Pulses positive and No Edema Skin:   Intact Neuro: Alert/Oriented, Flat, Normal Sensory and  Normal Motor Musc/Skel:  Normal Gen NAD   Assessment/Plan: 1. Functional deficits secondary to Post R MCA infarct which require 3+ hours per day of interdisciplinary therapy in a comprehensive inpatient rehab setting. Physiatrist is providing close team supervision and 24 hour management of active medical problems listed below. Physiatrist and rehab team continue to assess barriers to discharge/monitor patient progress toward functional and medical goals. FIM: FIM - Bathing Bathing Steps Patient Completed: Chest;Right Arm;Left Arm;Abdomen;Right upper leg;Left upper leg Bathing: 3: Mod-Patient completes 5-7 66f 10 parts or 50-74%  FIM - Upper Body Dressing/Undressing Upper body dressing/undressing steps patient completed: Thread/unthread right bra strap;Thread/unthread left bra strap;Hook/unhook bra;Thread/unthread right sleeve of pullover shirt/dresss;Pull shirt over trunk;Put head through opening of pull over shirt/dress;Thread/unthread left sleeve of pullover shirt/dress Upper body dressing/undressing: 5: Set-up assist to: Obtain clothing/put away FIM - Lower Body Dressing/Undressing Lower body dressing/undressing steps patient completed: Thread/unthread left pants leg;Thread/unthread right pants leg;Don/Doff left shoe Lower body dressing/undressing: 2: Max-Patient completed 25-49% of tasks  FIM - Toileting Toileting steps completed by patient: Adjust clothing prior to toileting Toileting Assistive Devices: Grab bar or rail for support Toileting: 2: Max-Patient completed 1 of 3 steps  FIM - Diplomatic Services operational officer Devices: Art gallery manager Transfers: 3-To toilet/BSC: Mod A (lift or lower assist);3-From toilet/BSC: Mod A (lift or lower assist)  FIM - Bed/Chair Transfer Bed/Chair Transfer Assistive Devices: Arm rests Bed/Chair Transfer: 3: Bed > Chair or W/C: Mod A (lift or lower assist);3: Chair or W/C > Bed: Mod A (lift or lower assist)  FIM - Locomotion:  Wheelchair Distance: 10' Locomotion: Wheelchair: 0: Activity did not occur FIM - Locomotion: Ambulation Locomotion: Ambulation Assistive Devices: Designer, industrial/product Ambulation/Gait Assistance: 3: Mod assist Locomotion: Ambulation: 1: Travels less than 50 ft with moderate assistance (Pt: 50 - 74%)  Comprehension Comprehension Mode: Auditory Comprehension: 5-Understands complex 90% of the time/Cues < 10% of the time  Expression Expression Mode: Verbal Expression: 4-Expresses basic 75 - 89% of the time/requires cueing 10 - 24% of the time. Needs helper to occlude trach/needs to repeat words.  Social Interaction Social Interaction: 5-Interacts appropriately 90% of the time - Needs monitoring or encouragement for participation or interaction.  Problem Solving Problem Solving: 5-Solves complex 90% of the time/cues < 10% of the time  Memory Memory: 5-Recognizes or recalls 90% of the time/requires cueing < 10% of the time   Medical Problem List and Plan:  1. Embolic bi cerebral infarcts  2. DVT Prophylaxis/Anticoagulation: SCDs. Monitor for any signs of DVT  3. Pain Management: Duragesic patch 25 mcg change every 72 hours, Lidoderm patch, Ultram as needed. Monitor with increased activity  4. Mood: Wellbutrin 100 mg daily, Effexor 75 mg daily. Provide emotional support  5. Neuropsych: This patient is capable of making decisions on her own behalf.  6. Metastatic adenocarcinoma with malignant ascites. Followup oncology services.  Patient receives weekly paracentesis for malignant ascites per interventional radiology--paracentesis yesterday 7. Dysphagia. Dysphagia 2 nectar liquids. Followup speech therapy. Monitor for any aspiration  8. Chronic anemia. Patient has been transfused.following clinically --9.9 9. Hypertension. Coreg 3.125 mg twice a day. Monitor with increased  activity  10. Obstructive sleep apnea. Continue CPAP  11. Diabetes mellitus with peripheral neuropathy. She is presently  off her Glucophage 500 mg twice a day as prior to admission. Hemoglobin A1c 6.2. Check blood sugars a.c. and at bedtime  12. Constipation and likely associated nausea---  -try SSE today  -senokot-s at bedtime, daily miralax  -prn rx of nausea    LOS (Days) 4 A FACE TO FACE EVALUATION WAS PERFORMED  Elmo Rio T 10/22/2012, 8:41 AM

## 2012-10-22 NOTE — Progress Notes (Signed)
Patient continues to be nauseated and has vomited after eating lunch.  Family present and requests that something else be done.  Contacted MD.  Orders received for KUB Stat.  Will continue to monitor.Oretha Milch D

## 2012-10-22 NOTE — Progress Notes (Signed)
At 1100 patient and pt's daughter refused SSE.  Daughter reported that at home pt occasionally used a fleet enema.  Prune juice given.  Will continue to monitor.[name] D

## 2012-10-23 ENCOUNTER — Other Ambulatory Visit: Payer: Self-pay | Admitting: *Deleted

## 2012-10-23 ENCOUNTER — Inpatient Hospital Stay (HOSPITAL_COMMUNITY): Admitting: Rehabilitation

## 2012-10-23 ENCOUNTER — Inpatient Hospital Stay (HOSPITAL_COMMUNITY): Admitting: Occupational Therapy

## 2012-10-23 ENCOUNTER — Inpatient Hospital Stay (HOSPITAL_COMMUNITY): Admitting: Speech Pathology

## 2012-10-23 ENCOUNTER — Inpatient Hospital Stay (HOSPITAL_COMMUNITY)

## 2012-10-23 ENCOUNTER — Telehealth: Payer: Self-pay | Admitting: Dietician

## 2012-10-23 ENCOUNTER — Encounter (HOSPITAL_COMMUNITY): Payer: Self-pay | Admitting: General Surgery

## 2012-10-23 DIAGNOSIS — R1110 Vomiting, unspecified: Secondary | ICD-10-CM

## 2012-10-23 DIAGNOSIS — R1013 Epigastric pain: Secondary | ICD-10-CM

## 2012-10-23 LAB — BASIC METABOLIC PANEL
CO2: 33 mEq/L — ABNORMAL HIGH (ref 19–32)
Calcium: 9.2 mg/dL (ref 8.4–10.5)
Creatinine, Ser: 1.11 mg/dL — ABNORMAL HIGH (ref 0.50–1.10)
GFR calc Af Amer: 50 mL/min — ABNORMAL LOW (ref >90)
Sodium: 133 mEq/L — ABNORMAL LOW (ref 135–145)

## 2012-10-23 LAB — CBC
HCT: 35.3 % — ABNORMAL LOW (ref 36.0–46.0)
Hemoglobin: 10.4 g/dL — ABNORMAL LOW (ref 12.0–15.0)
MCHC: 29.5 g/dL — ABNORMAL LOW (ref 30.0–36.0)
MCV: 78.6 fL (ref 78.0–100.0)
Platelets: 344 10*3/uL (ref 150–400)
RDW: 21.9 % — ABNORMAL HIGH (ref 11.5–15.5)

## 2012-10-23 MED ORDER — MAGNESIUM CITRATE PO SOLN: 1.0000 | Freq: Once | ORAL | Status: DC

## 2012-10-23 MED ORDER — DEXTROSE-NACL 5-0.45 % IV SOLN
INTRAVENOUS | Status: DC
  Administered 2012-10-23 – 2012-10-27 (×8): via INTRAVENOUS

## 2012-10-23 MED ORDER — FAMOTIDINE IN NACL 20-0.9 MG/50ML-% IV SOLN
20.0000 mg | INTRAVENOUS | Status: DC
Start: 2012-10-23 — End: 2012-10-27
  Administered 2012-10-24 – 2012-10-27 (×4): 20 mg via INTRAVENOUS
  Filled 2012-10-23 (×5): qty 50

## 2012-10-23 MED ORDER — FENTANYL 12 MCG/HR TD PT72
12.5000 ug | MEDICATED_PATCH | TRANSDERMAL | Status: DC
  Administered 2012-10-23 – 2012-10-25 (×2): 12.5 ug via TRANSDERMAL
  Filled 2012-10-23 (×2): qty 1

## 2012-10-23 MED ORDER — MAGNESIUM HYDROXIDE 400 MG/5ML PO SUSP
960.0000 mL | Freq: Once | ORAL | Status: AC
  Administered 2012-10-23: 960 mL via RECTAL
  Filled 2012-10-23: qty 240

## 2012-10-23 MED ORDER — IOHEXOL 300 MG/ML  SOLN
25.0000 mL | INTRAMUSCULAR | Status: AC
  Administered 2012-10-23: 25 mL via ORAL

## 2012-10-23 MED ORDER — IOHEXOL 300 MG/ML  SOLN
100.0000 mL | Freq: Once | INTRAMUSCULAR | Status: AC | PRN
  Administered 2012-10-23: 100 mL via INTRAVENOUS

## 2012-10-23 MED ORDER — SENNOSIDES-DOCUSATE SODIUM 8.6-50 MG PO TABS
2.0000 | ORAL_TABLET | Freq: Two times a day (BID) | ORAL | Status: DC
  Administered 2012-10-26: 2 via ORAL
  Filled 2012-10-23: qty 2

## 2012-10-23 MED ORDER — SODIUM CHLORIDE 0.9 % IV SOLN: INTRAVENOUS | Status: DC

## 2012-10-23 NOTE — Progress Notes (Signed)
Occupational Therapy Session Note  Patient Details  Name: [name] MRN: [id number] Date of Birth: 1922-03-27  Today's Date: 10/23/2012 Time: 1000-1100 Time Calculation (min): 60 min  Short Term Goals: Week 1:  OT Short Term Goal 1 (Week 1): Pt will perform LB bathing with supervision using AE. OT Short Term Goal 2 (Week 1): Pt will perform LB dressing with min assist using AE sit to stand. OT Short Term Goal 3 (Week 1): Pt will perform toilet transfer with min guard assist using 4 wheeled walker OT Short Term Goal 4 (Week 1): Pt will manage clothing during toileting tasks with min assist. OT Short Term Goal 5 (Week 1): Pt will donn pulllover or button up shirt with min assist.  Skilled Therapeutic Interventions/Progress Updates:    Pt performed bathing sitting EOB secondary to nursing having to give meds, pt not feeling well, and also pt going for abdominal x-ray at end of session.  Pt with increased lean to the left while sitting EOB.  Needed mod instructional cueing and occasional min assist to self correct balance.  She required mod assist to perform sit to stand with mod assist to maintain standing balance while therapist assisted pulling undergarment protector over hips.  She was able to wash her front peri area prior to this but needed max assist from therapist to wash the back.  Pt demonstrated increased posterior lean as well as left sided lean this session in standing which is different from previous session.  Pt placed back in the bed supine with HOB up.  Therapy Documentation Precautions:  Precautions Precautions: Fall Precaution Comments: moderate arthritis in bilateral shoulders Restrictions Weight Bearing Restrictions: No  Pain: Pain Assessment Pain Assessment: No/denies pain ADL: See FIM for current functional status  Therapy/Group: Individual Therapy  Jaxsin Bottomley OTR/L 10/23/2012, 12:05 PM

## 2012-10-23 NOTE — Progress Notes (Signed)
Nursing Note: Pt had IV infiltrate of contrast while downstairs in radiology.IV out and IV has been paged for new site.Site is at inner aspect of L forearm at elbow area. Site is red and fluidous in appearance.L arm is elevated and has an ice pack in place to site.wbb

## 2012-10-23 NOTE — Consult Note (Signed)
[name] [birth year]  [id number].    Requesting MD: Dr. Wynn Banker Chief Complaint/Reason for Consult: Abdominal pain and vomiting HPI:  79 y/o female with PMH metastatic adenoCA diagnosed in 2012 with previous chemotherapy (now stopped due to progression of disease) followed by Dr. Alcide Evener is currently in inpatient rehab on 4W.  She was admitted on 10/13/12 for CVA (cerebral infarctions) with dysphagia and left sided weakness.  She is currently on aspirin.  She was transferred to rehab on 10/18/12.  She has been having intermittent vomiting for the last 2 months which occurs at different times throughout the day.  It doesn't seem to be time related to food.  She has been treated with antiemetics by Dr. Alcide Evener without relief.   She has continued to vomit while hospitalized.  She currently complains of epigastric abdominal pain and pain over her umbilicus.  No chest pain, SOB, fever/chills, headaches, urinary symptoms.  No real nausea, no normal BM in a few days (although was recorded as one yesterday after enema).  No blood in stools.  C/o hoarse voice and dysphagia.  KUB yesterday and today show early small bowel obstruction.  She has been tolerating some clear liquids.  She was given a soap suds enema yesterday with some relief.    ROS: All systems reviewed and otherwise negative except for as above  No family history on file.  Past Medical History  Diagnosis Date  . Cataract   . Diabetes mellitus   . Cancer     Past Surgical History  Procedure Laterality Date  . Bladder surgery  06/1989  . Knee surgery  10/30/1993    Lt   . Back surgery  20/11/1998    ruptured disc  . Colon surgery    . Hernia repair    . Carpal tunnel Bilateral   . Achilles tendon repair      Social History:  reports that she has never smoked. She has never used smokeless tobacco. She reports that she does not drink alcohol. Her drug history is not on file.  Allergies:  Allergies  Allergen Reactions  .  Oxycodone Other (See Comments)    hallucinations  . Ace Inhibitors Other (See Comments)    Reaction unknown  . Ciprofloxacin Hcl Other (See Comments)    Reaction unknown  . Hydrocodone-Acetaminophen Other (See Comments)    Reports made her "crazy"    Medications Prior to Admission  Medication Sig Dispense Refill  . buPROPion (WELLBUTRIN SR) 100 MG 12 hr tablet Take 100 mg by mouth daily.       . carvedilol (COREG) 3.125 MG tablet Take 3.125 mg by mouth 2 (two) times daily with a meal.        . fentaNYL (DURAGESIC - DOSED MCG/HR) 25 MCG/HR patch Place 1 patch (25 mcg total) onto the skin every 3 (three) days.  5 patch  0  . KLOR-CON M20 20 MEQ tablet Take 10 mEq by mouth daily.       Marland Kitchen lidocaine (LIDODERM) 5 % Place 1 patch onto the skin daily. Remove & Discard patch within 12 hours or as directed by MD  30 patch  0  . ondansetron (ZOFRAN) 4 MG tablet Take 1 tablet (4 mg total) by mouth every 8 (eight) hours as needed.  20 tablet  0  . polyethylene glycol (MIRALAX / GLYCOLAX) packet Take 17 g by mouth daily.      Marland Kitchen senna (SENOKOT) 8.6 MG tablet Take 1 tablet by mouth 2 (two)  times daily.       . traMADol (ULTRAM) 50 MG tablet Take 100 mg by mouth every 6 (six) hours as needed for pain.      Marland Kitchen venlafaxine (EFFEXOR) 75 MG tablet Take 75 mg by mouth daily.         Blood pressure 123/73, pulse 114, temperature 97.7 F (36.5 C), temperature source Oral, resp. rate 20, height 5\' 1"  (1.549 m), weight 163 lb 2.3 oz (74 kg), SpO2 88.00%. Physical Exam: General: pleasant, WD/WN white female who is laying in bed in NAD HEENT: head is normocephalic, atraumatic.  Sclera are noninjected.  PERRL.  Ears and nose without any masses or lesions.  Mouth is pink and moist, hoarse voice. Heart: regular, rate, and rhythm.  No obvious murmurs, gallops, or rubs noted.  Palpable pedal pulses bilaterally Lungs: CTAB, no wheezes, rhonchi, or rales noted.  Low effort, on [place] O2 Abd: soft, upper abdominal distension,  tenderness in the upper abdomen and periumbilical, +BS, midline mesh palpated, hardened area of scar tissue just superior to the umbilicus, thick serosanguinous drainage and erythema on the inside of the umbilicus MS: all 4 extremities are symmetrical with no cyanosis, clubbing, or edema. Skin: warm and dry with no masses, lesions, or rashes Psych: A&Ox3 with an appropriate affect.  No results found for this or any previous visit (from the past 48 hour(s)). Dg Abd 1 View  10/23/2012   *RADIOLOGY REPORT*  Clinical Data: Constipation, nausea, vomiting, past history of hernia repair, diabetes, appendiceal cancer  ABDOMEN - 1 VIEW  Comparison: 10/22/2012  Findings: Retained contrast in colon. Persistent mild gaseous distention of small bowel loops in the left mid abdomen compatible with a mid small bowel obstruction. No bowel wall thickening identified. Prior ventral hernia repair. Marked osseous demineralization with multilevel degenerative disc disease changes with thoracolumbar spine. Degenerative changes of the right hip.  IMPRESSION: Persistent mid small bowel obstruction.   Original Report Authenticated By: Ulyses Southward, M.D.   Dg Abd 1 View  10/22/2012   *RADIOLOGY REPORT*  Clinical Data: Nausea, vomiting and constipation.  ABDOMEN - 1 VIEW  Comparison: Abdominal radiograph 07/14/2012.  Findings: Several dilated loops of gas-filled small bowel are noted in the left side of the abdomen measuring up to 4.6 cm in diameter. There does appear to be some gas, stool and oral contrast material scattered throughout the colon including the distal rectum.  No pneumoperitoneum. Markers from a mesh repair for ventral hernia is noted.  IMPRESSION: 1.  Findings concerning for potential early or partial small bowel obstruction.  Clinical correlation is recommended.   Original Report Authenticated By: Trudie Reed, M.D.       Assessment/Plan Vomiting - x 2 months without significant nausea Epigastric abdominal  pain Periumbilical abdominal pain Umbilical erythema and drainage - ?candida ?pSBO - she continues to have some flatus, BM yesterday with enema and +BS  Plan: 1.  Conservative treatment (IVF, pain control, antiemetics, NPO), low threshold for NG tube 2.  Okay to continue with SMOG enema 3.  Obtain CT scan, she is not a very good surgical candidate, ?G-tube 4.  Would recommend a palliative care consult to discuss goals of care 5.  CBC and BMET ordered 6.  Dr. Magnus Ivan to see (her previous surgeon as well)   DORT, Lynesha Bango 10/23/2012, 2:12 PM Pager: [phone]

## 2012-10-23 NOTE — Progress Notes (Signed)
Physical Therapy Session Note  Patient Details  Name: [name] MRN: [id number] Date of Birth: 01-07-1923  Today's Date: 10/23/2012 Time: 1140-1200 Time Calculation (min): 20 min  Short Term Goals: Week 1:  PT Short Term Goal 1 (Week 1): Pt will perform bed mobility at min assist  PT Short Term Goal 2 (Week 1): Pt will perform transfers at min assist PT Short Term Goal 3 (Week 1): Pt will tolerate 10 mins of activity with single rest break with SaO2 above 90% on RA.   Skilled Therapeutic Interventions/Progress Updates:   Pt missed 25 mins during this session due to xray/scan of abdomen and nursing monitoring vitals as soon as she returned to room.  Pt continues to feel nauseated, however states better than yesterday.  Focused on supine exercises this am as she did not feel like getting OOB yet.  Performed supine quad sets x 10 reps BLE, SLR x 10 reps BLEs, hip abd x 10 reps BLEs and supine BLE bridging x 10 reps.  Performed all tasks on 2 LO2 with SaO2 at 92% following activity.  Continue to provide cues for pursed lip breathing, esp during tasks as she tends to hold her breath.  Pt left in bed with bed alarm set and all needs in place.    Therapy Documentation Precautions:  Precautions Precautions: Fall Precaution Comments: moderate arthritis in bilateral shoulders Restrictions Weight Bearing Restrictions: No General: Amount of Missed PT Time (min): 25 Minutes Missed Time Reason: X-Ray;Nursing care Vital Signs: Therapy Vitals Temp: 98.5 F (36.9 C) Temp src: Oral Pulse Rate: 114 Resp: 18 BP: 134/78 mmHg Patient Position, if appropriate: Lying Oxygen Therapy SpO2: 92 % O2 Device: Nasal cannula O2 Flow Rate (L/min): 2 L/min Pain: Pain Assessment Pain Assessment: No/denies pain  See FIM for current functional status  Therapy/Group: Individual Therapy  Vista Deck 10/23/2012, 12:37 PM

## 2012-10-23 NOTE — Progress Notes (Signed)
Speech Language Pathology Daily Session Note  Patient Details  Name: [name] MRN: [id number] Date of Birth: December 15, 1922  Today's Date: 10/23/2012 Time: 1478-2956 Time Calculation (min): 43 min  Short Term Goals: Week 1: SLP Short Term Goal 1 (Week 1): Patient will utilize word finding strategies with Mod I  SLP Short Term Goal 2 (Week 1): Patient will consume Dys.2 textures and thin liquids with minimal overt s/s of aspiration with Supervision verbal cues.  Skilled Therapeutic Interventions: Skilled treatment session focused on addressing dysphagia and word finding goals.  SLP facilitated session with trials of water via cup with intermittent throat clears and belches.  Patient demonstrated no significant overt s/s of aspiration.  Patient requested ice chips and consumed them throughout session without difficulty.  SLP also initiaited eduction of word finding strategies with a structured task and Max faded to Mod cues to utilize description; patient with spontaneouse use of gestrues during task.  Of note, patient did demonstrate increased confusion today throughout session resulting in increased cuing for basic tasks.      FIM:  Comprehension Comprehension Mode: Auditory Comprehension: 4-Understands basic 75 - 89% of the time/requires cueing 10 - 24% of the time Expression Expression Mode: Verbal Expression: 3-Expresses basic 50 - 74% of the time/requires cueing 25 - 50% of the time. Needs to repeat parts of sentences. Social Interaction Social Interaction: 5-Interacts appropriately 90% of the time - Needs monitoring or encouragement for participation or interaction. Problem Solving Problem Solving: 5-Solves basic 90% of the time/requires cueing < 10% of the time Memory Memory: 4-Recognizes or recalls 75 - 89% of the time/requires cueing 10 - 24% of the time FIM - Eating Eating Activity: 5: Supervision/cues  Pain Pain Assessment Pain Assessment: No/denies pain  Therapy/Group:  Individual Therapy  Charlane Ferretti., CCC-SLP [phone]  Laquana Villari 10/23/2012, 9:57 AM

## 2012-10-23 NOTE — Plan of Care (Signed)
Problem: RH BOWEL ELIMINATION Goal: RH STG MANAGE BOWEL W/MEDICATION W/ASSISTANCE STG Manage Bowel with Medication with min Assistance.  Outcome: Not Progressing No results after soap suds enema. Abdomen distended with nausea and vomiting (bile). adm

## 2012-10-23 NOTE — Progress Notes (Signed)
Physical Therapy Session Note  Patient Details  Name: [name] MRN: [id number] Date of Birth: 07/21/1922  Today's Date: 10/23/2012 Time: 4696-2952 Time Calculation (min): 30 min  Short Term Goals: Week 1:  PT Short Term Goal 1 (Week 1): Pt will perform bed mobility at min assist  PT Short Term Goal 2 (Week 1): Pt will perform transfers at min assist PT Short Term Goal 3 (Week 1): Pt will tolerate 10 mins of activity with single rest break with SaO2 above 90% on RA.   Skilled Therapeutic Interventions/Progress Updates:   Pt meeting with GI PA when PT arrived this afternoon.  Upon PA leaving, pt states she needs to go to restroom.  Performed supine to sit at mod assist to help elevate trunk with cues for using UEs to self assist.  Note pt with increased difficulty with getting into upright sitting position and with increase lateral lean to L.  However, once in sitting balance improved and also improved with transfers.  Performed stand pivot transfer x 2 repswith RW at min/mod assist with cues for increased forward weight shift for buttock clearance and cues for LE advancement sequencing for safe transfer.  Performed standing balance activity at RW with feet shoulder width apart, taking one hand off RW, progressing to no hands on RW.  Pt able to stand x 30 secs without LOB, however requires seated rest break following each activity.  Also had pt stand in modified tandem with two hands, then one hand, to no hands.  Able to tolerate standing 20 secs without LOB and again requires rest break following.  SaO2 dropped to 88% during activity, however with increased cues for pursed lip breathing and rest breaks was able to increase sats to 92% on 2L O2.  Pt left in recliner in room with all needs in place.    Therapy Documentation Precautions:  Precautions Precautions: Fall Precaution Comments: moderate arthritis in bilateral shoulders Restrictions Weight Bearing Restrictions: No General: Amount  of Missed PT Time (min): 25 Minutes Missed Time Reason: X-Ray;Nursing care Vital Signs: Therapy Vitals Temp: 98.3 F (36.8 C) Temp src: Oral Pulse Rate: 124 Resp: 20 BP: 139/80 mmHg Patient Position, if appropriate: Lying Oxygen Therapy SpO2: 93 % O2 Device: Nasal cannula O2 Flow Rate (L/min): 2 L/min Pain: Pt with no stated number for pain, however does still have nausea and abdomen issues during session.  See FIM for current functional status  Therapy/Group: Individual Therapy  Vista Deck 10/23/2012, 3:55 PM

## 2012-10-23 NOTE — Progress Notes (Signed)
Nursing Note: addendum Pt's L arm has ice pack to L inner arm and is elevated on 2 piillows.wbb

## 2012-10-23 NOTE — Progress Notes (Signed)
Discussed with pt. and daughter need for laxative at HS; suppository in AM if needed. Also discussed administration time... Did not want to give sorbitol early eve as may keep pt. awake at night. BS +, abd distended(ascites) .Patient and daughter agree.

## 2012-10-23 NOTE — Progress Notes (Signed)
Late entry: Report of nausea, and vomiting over the weekend by night nurse, and pt unable to sufficiently move her bowel. At 1000 during morning assessment, pt c/o abominal pain around umbilicus. Umbilicus noted to be red,  hard area palpated to LUQ around, and small amount of white milky drainage noted from umbilicus. Harvel Ricks, PA notified, and stated that pt's surgeon would be contacted.   Aris [place], Surgical PA came to assess pt. New orders written, to include administration of SMOG enema. Enema administered with small results. Also contrast x 2 ordered for CT scan. Pt reports that she is unable to consume all of contrast, states that 'she feels full'. Call placed to CT, and spoke to [place] to advise. Annabelle Harman stated that CT could still be done, but results would be suboptima .However, ordering provider would need to be notified.Call placed to on-call md. Spoke to Dr. Derrell Lolling, regarding pt's inability to consume all of contrast. Received order from Dr. Derrell Lolling to send pt down for CT scan.

## 2012-10-23 NOTE — Consult Note (Signed)
I have seen and examined the patient and agree with the assessment and plans. She is a poor operative candidate.  We can at least get a CT scan to see how bad her carcinomatosis is prior to considering any surgery.  Glennice Marcos A. Magnus Ivan  MD, FACS

## 2012-10-23 NOTE — Progress Notes (Signed)
Patient ID: [name], female   DOB: [birth year], 79 y.o.   MRN: [id number] 79 y.o. right-handed female with history of metastatic adenocarcinoma diagnosed 2012.previous chemotherapy treatments but stopped due to progression of her cancer, malignant ascites, diabetes mellitus peripheral neuropathy. Patient independent with rolling walker prior to admission. Admitted 10/13/2012 after a fall with noted slurred speech and left-sided weakness. MRI of the brain showed scattered small acute posterior right MCA territory infarcts. Several tiny acute infarcts also in the posterior left MCA territory. MRA of the head negative. Echocardiogram with ejection fraction of 55% normal systolic function. Carotid Dopplers with no ICA stenosis. Patient did not receive TPA. Neurology services consulted placed on aspirin therapy for CVA prophylaxis. Speech therapy for dysphagia currently maintained on a dysphagia 2 thin liquid diet. Noted chronic anemia hemoglobin 6.8 patient has been transfused with latest hemoglobin 10.1. Patient is being followed by hospice care for history of progressive metastatic adenocarcinoma. She does undergo weekly therapeutic paracentesis for chronic ascites and followed by oncology services Dr. Arnoldo Lenis.  Vomited small amt cranberry juice last noc Subjective/Complaints:  A 12 point review of systems has been performed and if not noted above is otherwise negative. . Objective: Vital Signs: Blood pressure 138/80, pulse 108, temperature 97.7 F (36.5 C), temperature source Oral, resp. rate 20, height 5\' 1"  (1.549 m), weight 74 kg (163 lb 2.3 oz), SpO2 95.00%. Dg Abd 1 View  10/22/2012   *RADIOLOGY REPORT*  Clinical Data: Nausea, vomiting and constipation.  ABDOMEN - 1 VIEW  Comparison: Abdominal radiograph 07/14/2012.  Findings: Several dilated loops of gas-filled small bowel are noted in the left side of the abdomen measuring up to 4.6 cm in diameter. There does appear to be some gas, stool  and oral contrast material scattered throughout the colon including the distal rectum.  No pneumoperitoneum. Markers from a mesh repair for ventral hernia is noted.  IMPRESSION: 1.  Findings concerning for potential early or partial small bowel obstruction.  Clinical correlation is recommended.   Original Report Authenticated By: Trudie Reed, M.D.   No results found for this or any previous visit (from the past 72 hour(s)).   HEENT: normal and no drainage Cardio: RRR and no murmurs Resp: CTA B/L and unlabored GI: BS infrequent but present,  mild distension persistent. Non-tender in Lower quadrants, tenderness and fullness in epigastric area Extremity:  Pulses positive and No Edema Skin:   Intact Neuro: Alert/Oriented, Flat, Normal Sensory and Normal Motor Musc/Skel:  Normal Gen NAD   Assessment/Plan: 1. Functional deficits secondary to Post R MCA infarct which require 3+ hours per day of interdisciplinary therapy in a comprehensive inpatient rehab setting. Physiatrist is providing close team supervision and 24 hour management of active medical problems listed below. Physiatrist and rehab team continue to assess barriers to discharge/monitor patient progress toward functional and medical goals.  Currently nauseated. No vomiting currently.  PT/OT as pt tolerates, activity should help with bowels FIM: FIM - Bathing Bathing Steps Patient Completed: Chest;Right Arm;Left Arm;Abdomen;Right upper leg;Left upper leg Bathing: 3: Mod-Patient completes 5-7 48f 10 parts or 50-74%  FIM - Upper Body Dressing/Undressing Upper body dressing/undressing steps patient completed: Thread/unthread right bra strap;Thread/unthread left bra strap;Hook/unhook bra;Thread/unthread right sleeve of pullover shirt/dresss;Pull shirt over trunk;Put head through opening of pull over shirt/dress;Thread/unthread left sleeve of pullover shirt/dress Upper body dressing/undressing: 5: Set-up assist to: Obtain clothing/put  away FIM - Lower Body Dressing/Undressing Lower body dressing/undressing steps patient completed: Thread/unthread left pants leg;Thread/unthread right pants leg;Don/Doff left  shoe Lower body dressing/undressing: 2: Max-Patient completed 25-49% of tasks  FIM - Toileting Toileting steps completed by patient: Adjust clothing prior to toileting Toileting Assistive Devices: Grab bar or rail for support Toileting: 2: Max-Patient completed 1 of 3 steps  FIM - Diplomatic Services operational officer Devices: Art gallery manager Transfers: 3-To toilet/BSC: Mod A (lift or lower assist);3-From toilet/BSC: Mod A (lift or lower assist)  FIM - Bed/Chair Transfer Bed/Chair Transfer Assistive Devices: Arm rests Bed/Chair Transfer: 3: Bed > Chair or W/C: Mod A (lift or lower assist);3: Chair or W/C > Bed: Mod A (lift or lower assist)  FIM - Locomotion: Wheelchair Distance: 10' Locomotion: Wheelchair: 0: Activity did not occur FIM - Locomotion: Ambulation Locomotion: Ambulation Assistive Devices: Designer, industrial/product Ambulation/Gait Assistance: 3: Mod assist Locomotion: Ambulation: 1: Travels less than 50 ft with moderate assistance (Pt: 50 - 74%)  Comprehension Comprehension Mode: Auditory Comprehension: 5-Understands complex 90% of the time/Cues < 10% of the time  Expression Expression Mode: Verbal Expression: 4-Expresses basic 75 - 89% of the time/requires cueing 10 - 24% of the time. Needs helper to occlude trach/needs to repeat words.  Social Interaction Social Interaction: 5-Interacts appropriately 90% of the time - Needs monitoring or encouragement for participation or interaction.  Problem Solving Problem Solving: 5-Solves basic 90% of the time/requires cueing < 10% of the time  Memory Memory: 5-Recognizes or recalls 90% of the time/requires cueing < 10% of the time   Medical Problem List and Plan:  1. Embolic bi cerebral infarcts  2. DVT Prophylaxis/Anticoagulation: SCDs. Monitor for  any signs of DVT  3. Pain Management: Duragesic patch 25 mcg change every 72 hours, will try to reduce to until constip improved, Lidoderm patch, Ultram as needed. Has prn MSO4 as well Monitor with increased activity  4. Mood: Wellbutrin 100 mg daily, Effexor 75 mg daily. Provide emotional support  5. Neuropsych: This patient is capable of making decisions on her own behalf.  6. Metastatic adenocarcinoma with malignant ascites. Followup oncology services.  Patient receives weekly paracentesis for malignant ascites per interventional radiology--paracentesis yesterday 7. Dysphagia. Dysphagia 2 nectar liquids. Followup speech therapy. Monitor for any aspiration  8. Chronic anemia. Patient has been transfused.following clinically --9.9 9. Hypertension. Coreg 3.125 mg twice a day. Monitor with increased activity  10. Obstructive sleep apnea. Continue CPAP  11. Diabetes mellitus with peripheral neuropathy. She is presently off her Glucophage 500 mg twice a day as prior to admission. Hemoglobin A1c 6.2. Check blood sugars a.c. and at bedtime  12. Constipation and likely associated nause/vomiting-possible early partial SBO, clinically non distended and non tender in LQs, chronic epigastric tenderness recheck KUB  -try SMOG enema today  -senokot-s BID, daily miralax  -prn rx of nausea IV Zofran until not vomiting    LOS (Days) 5 A FACE TO FACE EVALUATION WAS PERFORMED  Ishan Sanroman E 10/23/2012, 9:01 AM

## 2012-10-24 ENCOUNTER — Inpatient Hospital Stay (HOSPITAL_COMMUNITY): Admitting: Speech Pathology

## 2012-10-24 ENCOUNTER — Inpatient Hospital Stay (HOSPITAL_COMMUNITY): Admitting: Rehabilitation

## 2012-10-24 ENCOUNTER — Inpatient Hospital Stay (HOSPITAL_COMMUNITY): Admitting: Occupational Therapy

## 2012-10-24 ENCOUNTER — Inpatient Hospital Stay (HOSPITAL_COMMUNITY): Payer: Medicare Other | Admitting: Rehabilitation

## 2012-10-24 DIAGNOSIS — C50919 Malignant neoplasm of unspecified site of unspecified female breast: Secondary | ICD-10-CM

## 2012-10-24 DIAGNOSIS — K5900 Constipation, unspecified: Secondary | ICD-10-CM

## 2012-10-24 DIAGNOSIS — R5381 Other malaise: Secondary | ICD-10-CM

## 2012-10-24 DIAGNOSIS — K56609 Unspecified intestinal obstruction, unspecified as to partial versus complete obstruction: Secondary | ICD-10-CM

## 2012-10-24 DIAGNOSIS — R112 Nausea with vomiting, unspecified: Secondary | ICD-10-CM

## 2012-10-24 DIAGNOSIS — I6330 Cerebral infarction due to thrombosis of unspecified cerebral artery: Secondary | ICD-10-CM

## 2012-10-24 MED ORDER — BISACODYL 10 MG RE SUPP
10.0000 mg | Freq: Once | RECTAL | Status: AC
  Administered 2012-10-24: 10 mg via RECTAL

## 2012-10-24 MED ORDER — SORBITOL 70 % SOLN
960.0000 mL | TOPICAL_OIL | Freq: Once | ORAL | Status: AC
  Administered 2012-10-24: 960 mL via RECTAL
  Filled 2012-10-24: qty 240

## 2012-10-24 NOTE — Progress Notes (Signed)
Nursing Note: Pt to radiology.wbb

## 2012-10-24 NOTE — Progress Notes (Signed)
Occupational Therapy Session Note  Patient Details  Name: [name] MRN: [id number] Date of Birth: Aug 20, 1922  Today's Date: 10/24/2012 Time: 4098-1191 Time Calculation (min): 64 min  Short Term Goals: Week 1:  OT Short Term Goal 1 (Week 1): Pt will perform LB bathing with supervision using AE. OT Short Term Goal 2 (Week 1): Pt will perform LB dressing with min assist using AE sit to stand. OT Short Term Goal 3 (Week 1): Pt will perform toilet transfer with min guard assist using 4 wheeled walker OT Short Term Goal 4 (Week 1): Pt will manage clothing during toileting tasks with min assist. OT Short Term Goal 5 (Week 1): Pt will donn pulllover or button up shirt with min assist.  Skilled Therapeutic Interventions/Progress Updates:    Pt performed bathing and dressing sit to stand in the bedside chair based on current conditions.  Placed wash pan on the seat of her 4 wheeled walker as the bedside table is too high for her to reach secondary to bilateral shoulder limitations.  Pt utilized AE for doffing shoes and socks with mod instructional cueing for techniques.  Pt used LH sponge for washing her feet and needed mod assist to integrate the reacher and sockaide for donning pants, socks, and shoes.  Pt overall able to stand from the bedside chair with min assist.  O2 sats 89 % on 2Ls with HR at 100 BPM.    Therapy Documentation Precautions:  Precautions Precautions: Fall Precaution Comments: moderate arthritis in bilateral shoulders Restrictions Weight Bearing Restrictions: No  Pain: Pain Assessment Pain Assessment: Faces Faces Pain Scale: Hurts a little bit Pain Type: Chronic pain Pain Location: Shoulder Pain Orientation: Right Pain Intervention(s): Medication (See eMAR);Repositioned ADL: See FIM for current functional status  Therapy/Group: Individual Therapy  Hymie Gorr OTR/L 10/24/2012, 10:43 AM

## 2012-10-24 NOTE — Progress Notes (Signed)
Patient ID: [name], female   DOB: 1922/10/29, 79 y.o.   MRN: [id number] 79 y.o. right-handed female with history of metastatic adenocarcinoma diagnosed 2012.previous chemotherapy treatments but stopped due to progression of her cancer, malignant ascites, diabetes mellitus peripheral neuropathy. Patient independent with rolling walker prior to admission. Admitted 10/13/2012 after a fall with noted slurred speech and left-sided weakness. MRI of the brain showed scattered small acute posterior right MCA territory infarcts. Several tiny acute infarcts also in the posterior left MCA territory. MRA of the head negative. Echocardiogram with ejection fraction of 55% normal systolic function. Carotid Dopplers with no ICA stenosis. Patient did not receive TPA. Neurology services consulted placed on aspirin therapy for CVA prophylaxis. Speech therapy for dysphagia currently maintained on a dysphagia 2 thin liquid diet. Noted chronic anemia hemoglobin 6.8 patient has been transfused with latest hemoglobin 10.1. Patient is being followed by hospice care for history of progressive metastatic adenocarcinoma. She does undergo weekly therapeutic paracentesis for chronic ascites and followed by oncology services Dr. Arnoldo Lenis.   Subjective/Complaints: No results from SMOG enema.  Pt has done 1-2 hours therapy last couple days, limited by nausea/vomiting Case discussed with Dr Truett Perna as well as pt acdn daughter today Gen Surgery on consult to further eval cause of partial obstruction Not looking at surgical treatments A 12 point review of systems has been performed and if not noted above is otherwise negative. . Objective: Vital Signs: Blood pressure 122/72, pulse 104, temperature 97.2 F (36.2 C), temperature source Oral, resp. rate 18, height 5\' 1"  (1.549 m), weight 74 kg (163 lb 2.3 oz), SpO2 96.00%. Dg Abd 1 View  10/23/2012   *RADIOLOGY REPORT*  Clinical Data: Constipation, nausea, vomiting, past  history of hernia repair, diabetes, appendiceal cancer  ABDOMEN - 1 VIEW  Comparison: 10/22/2012  Findings: Retained contrast in colon. Persistent mild gaseous distention of small bowel loops in the left mid abdomen compatible with a mid small bowel obstruction. No bowel wall thickening identified. Prior ventral hernia repair. Marked osseous demineralization with multilevel degenerative disc disease changes with thoracolumbar spine. Degenerative changes of the right hip.  IMPRESSION: Persistent mid small bowel obstruction.   Original Report Authenticated By: Ulyses Southward, M.D.   Dg Abd 1 View  10/22/2012   *RADIOLOGY REPORT*  Clinical Data: Nausea, vomiting and constipation.  ABDOMEN - 1 VIEW  Comparison: Abdominal radiograph 07/14/2012.  Findings: Several dilated loops of gas-filled small bowel are noted in the left side of the abdomen measuring up to 4.6 cm in diameter. There does appear to be some gas, stool and oral contrast material scattered throughout the colon including the distal rectum.  No pneumoperitoneum. Markers from a mesh repair for ventral hernia is noted.  IMPRESSION: 1.  Findings concerning for potential early or partial small bowel obstruction.  Clinical correlation is recommended.   Original Report Authenticated By: Trudie Reed, M.D.   Results for orders placed during the hospital encounter of 10/18/12 (from the past 72 hour(s))  CBC     Status: Abnormal   Collection Time    10/23/12  3:52 PM      Result Value Range   WBC 11.1 (*) 4.0 - 10.5 K/uL   RBC 4.49  3.87 - 5.11 MIL/uL   Hemoglobin 10.4 (*) 12.0 - 15.0 g/dL   HCT 11.9 (*) 14.7 - 82.9 %   MCV 78.6  78.0 - 100.0 fL   MCH 23.2 (*) 26.0 - 34.0 pg   MCHC 29.5 (*) 30.0 -  36.0 g/dL   RDW 09.8 (*) 11.9 - 14.7 %   Platelets 344  150 - 400 K/uL  BASIC METABOLIC PANEL     Status: Abnormal   Collection Time    10/23/12  3:52 PM      Result Value Range   Sodium 133 (*) 135 - 145 mEq/L   Potassium 5.2 (*) 3.5 - 5.1 mEq/L    Chloride 91 (*) 96 - 112 mEq/L   CO2 33 (*) 19 - 32 mEq/L   Glucose, Bld 181 (*) 70 - 99 mg/dL   BUN 24 (*) 6 - 23 mg/dL   Creatinine, Ser 8.29 (*) 0.50 - 1.10 mg/dL   Calcium 9.2  8.4 - 56.2 mg/dL   GFR calc non Af Amer 43 (*) >90 mL/min   GFR calc Af Amer 50 (*) >90 mL/min   Comment: (NOTE)     The eGFR has been calculated using the CKD EPI equation.     This calculation has not been validated in all clinical situations.     eGFR's persistently <90 mL/min signify possible Chronic Kidney     Disease.     HEENT: normal and no drainage Cardio: RRR and no murmurs Resp: CTA B/L and unlabored GI: BS infrequent but present,  mild distension persistent. Non-tender in Lower quadrants, tenderness and fullness in epigastric area Extremity:  Pulses positive and No Edema Skin:   Intact Neuro: Alert/Oriented, Flat, Normal Sensory and Normal Motor Musc/Skel:  Normal Gen NAD   Assessment/Plan: 1. Functional deficits secondary to Post R MCA infarct which require 3+ hours per day of interdisciplinary therapy in a comprehensive inpatient rehab setting. Physiatrist is providing close team supervision and 24 hour management of active medical problems listed below. Physiatrist and rehab team continue to assess barriers to discharge/monitor patient progress toward functional and medical goals.  Currently nauseated. No vomiting currently.  PT/OT as pt tolerates, activity should help with bowels Reduced fentanyl patch without increased pain Will discuss with team in am whether participation is adequate to justify CIR level Other options may include transfer to acute vs [facility] FIM: FIM - Bathing Bathing Steps Patient Completed: Chest;Right Arm;Left Arm;Abdomen;Right upper leg;Left upper leg Bathing: 3: Mod-Patient completes 5-7 25f 10 parts or 50-74%  FIM - Upper Body Dressing/Undressing Upper body dressing/undressing steps patient completed: Thread/unthread right bra strap;Thread/unthread left  bra strap;Hook/unhook bra;Thread/unthread right sleeve of pullover shirt/dresss;Pull shirt over trunk;Put head through opening of pull over shirt/dress;Thread/unthread left sleeve of pullover shirt/dress Upper body dressing/undressing: 0: Activity did not occur (pt wore gown secondary to going for x-ray) FIM - Lower Body Dressing/Undressing Lower body dressing/undressing steps patient completed: Thread/unthread left pants leg;Thread/unthread right pants leg;Don/Doff left shoe Lower body dressing/undressing: 1: Total-Patient completed less than 25% of tasks  FIM - Toileting Toileting steps completed by patient: Adjust clothing prior to toileting;Performs perineal hygiene Toileting Assistive Devices:  (walker) Toileting: 3: Mod-Patient completed 2 of 3 steps  FIM - Diplomatic Services operational officer Devices: Art gallery manager Transfers: 3-To toilet/BSC: Mod A (lift or lower assist);3-From toilet/BSC: Mod A (lift or lower assist)  FIM - Bed/Chair Transfer Bed/Chair Transfer Assistive Devices: Manufacturing systems engineer Transfer: 3: Supine > Sit: Mod A (lifting assist/Pt. 50-74%/lift 2 legs;4: Bed > Chair or W/C: Min A (steadying Pt. > 75%);3: Bed > Chair or W/C: Mod A (lift or lower assist);3: Chair or W/C > Bed: Mod A (lift or lower assist)  FIM - Locomotion: Wheelchair Distance: 10' Locomotion: Wheelchair: 0: Activity did not  occur FIM - Locomotion: Ambulation Locomotion: Ambulation Assistive Devices: Walker - Rolling Ambulation/Gait Assistance: 3: Mod assist Locomotion: Ambulation: 0: Activity did not occur  Comprehension Comprehension Mode: Auditory Comprehension: 4-Understands basic 75 - 89% of the time/requires cueing 10 - 24% of the time  Expression Expression Mode: Verbal Expression: 3-Expresses basic 50 - 74% of the time/requires cueing 25 - 50% of the time. Needs to repeat parts of sentences.  Social Interaction Social Interaction: 4-Interacts appropriately 75 - 89% of the time  - Needs redirection for appropriate language or to initiate interaction.  Problem Solving Problem Solving: 3-Solves basic 50 - 74% of the time/requires cueing 25 - 49% of the time  Memory Memory: 2-Recognizes or recalls 25 - 49% of the time/requires cueing 51 - 75% of the time   Medical Problem List and Plan:  1. Embolic bi cerebral infarcts  2. DVT Prophylaxis/Anticoagulation: SCDs. Monitor for any signs of DVT  3. Pain Management: Duragesic patch 12.5 mcg change every 72 hours, , Lidoderm patch, Ultram as needed. Has prn MSO4 as well Monitor with increased activity  4. Mood: Wellbutrin 100 mg daily, Effexor 75 mg daily. Provide emotional support  5. Neuropsych: This patient is capable of making decisions on her own behalf.  6. Metastatic adenocarcinoma with malignant ascites. Followup oncology services.  Patient receives weekly paracentesis for malignant ascites per interventional radiology-7. Dysphagia. Dysphagia 2 nectar liquids. Followup speech therapy. Monitor for any aspiration  8. Chronic anemia. Patient has been transfused.following clinically --9.9 9. Hypertension. Coreg 3.125 mg twice a day. Monitor with increased activity  10. Obstructive sleep apnea. Continue CPAP  11. Diabetes mellitus with peripheral neuropathy. She is presently off her Glucophage 500 mg twice a day as prior to admission. Hemoglobin A1c 6.2. Check blood sugars a.c. and at bedtime  12. Constipation and likely associated nause/vomiting-possible early partial SBO, clinically non distended and non tender in LQs, chronic epigastric surgery to f/u with recs    -prn rx of nausea IV Zofran until not vomiting    LOS (Days) 6 A FACE TO FACE EVALUATION WAS PERFORMED  KIRSTEINS,ANDREW E 10/24/2012, 7:04 AM

## 2012-10-24 NOTE — Progress Notes (Signed)
Physical Therapy Session Note  Patient Details  Name: [name] MRN: [id number] Date of Birth: [birth year]  Today's Date: 10/24/2012 Time: 4098-1191 Time Calculation (min): 54 min  Short Term Goals: Week 1:  PT Short Term Goal 1 (Week 1): Pt will perform bed mobility at min assist  PT Short Term Goal 2 (Week 1): Pt will perform transfers at min assist PT Short Term Goal 3 (Week 1): Pt will tolerate 10 mins of activity with single rest break with SaO2 above 90% on RA.   Skilled Therapeutic Interventions/Progress Updates:   Pt received in bed this morning with daughter present.  Pt states she would like to try therapy this morning.  Performed supine to sit at mod assist to assist with elevating trunk, despite having HOB elevated and use of handrails.  Pt states she needs to use restroom, therefore assisted to bedside commode via RW at min assist with cues for RW placement and sequencing with feet to get aligned with bedside commode. Performed again back to bed to allow room and set up w/c at door to prepare for amb in room.  Performed sit <> stand x 4 reps at min/guard assist with cues for increased forward lean.  Ambulated x 12' x 2 reps in room with RW at min/guard assist with cues for upright posture, increased stride length, and continued pursed lip breathing.  Performed all activity on 2.5 L O2 (on wall in room) and then 3 L during ambulation with SaO2 sats at 94% following ambulation.  Pt shared feelings about being prepared for dying and things that she still had left to do.  She also became emotional stating that she wanted to see her husband again and also missing her family.  Provided emotional support for pt during session.  Left in recliner with all needs in place.    Therapy Documentation Precautions:  Precautions Precautions: Fall Precaution Comments: moderate arthritis in bilateral shoulders Restrictions Weight Bearing Restrictions: No   Pain: Pt with c/o pain in R shoulder,  RN notified pts request for pain meds/patch.    Locomotion : Ambulation Ambulation/Gait Assistance: 4: Min assist   See FIM for current functional status  Therapy/Group: Individual Therapy  Vista Deck 10/24/2012, 9:29 AM

## 2012-10-24 NOTE — Plan of Care (Signed)
Problem: RH BLADDER ELIMINATION Goal: RH STG MANAGE BLADDER WITH ASSISTANCE STG Manage Bladder With min Assistance  Outcome: Progressing Incont. But voids in bsc at times

## 2012-10-24 NOTE — Progress Notes (Signed)
Nursing Note: Pt back form [email or web address]

## 2012-10-24 NOTE — Progress Notes (Signed)
I have seen and examined the patient and agree with the assessment and plans. CT reviewed.  Deshia Vanderhoof A. Magnus Ivan  MD, FACS

## 2012-10-24 NOTE — Progress Notes (Signed)
Social Work Patient ID: [name], female   DOB: 1922-09-26, 79 y.o.   MRN: [id number] Met with pt along with Speech therapist to discuss her wants.  She reports her husband visited and it was very emotional and draining. She wants to talk with her family about the next step and will get back with this worker.  Spoke via telephone to Therese-daughter to see the plan. She reports they are going to try to unblock Mom before trying to go home.  She is aware if she is not able to participate in therapies she can not stay [facility].  Aware will re-initiate Hospice once discharged from rehab.  Pt wants to be at home with her husband.  Daughter wants to make sure she  Can handle Mom at home.  See tomorrow how she is doing.

## 2012-10-24 NOTE — Progress Notes (Signed)
Patient ID: [name], female   DOB: 1922/10/03, 79 y.o.   MRN: [id number]    Subjective: Pt feels ok today.  C/o butt pain because she's stopped up.  No nausea or emesis.  No abdominal pain.  Objective: Vital signs in last 24 hours: Temp:  [97.2 F (36.2 C)-98.5 F (36.9 C)] 97.2 F (36.2 C) (09/09 0509) Pulse Rate:  [104-126] 104 (09/09 0509) Resp:  [18-20] 18 (09/09 0509) BP: (116-139)/(68-84) 122/72 mmHg (09/09 0509) SpO2:  [88 %-96 %] 96 % (09/09 0509) Last BM Date: 10/17/12 (has had enemas w /scant stool )  Intake/Output from previous day: 09/08 0701 - 09/09 0700 In: 720 [P.O.:720] Out: -  Intake/Output this shift:    PE: Abd: soft, NT, ND, +BS  Lab Results:   Recent Labs  10/23/12 1552  WBC 11.1*  HGB 10.4*  HCT 35.3*  PLT 344   BMET  Recent Labs  10/23/12 1552  NA 133*  K 5.2*  CL 91*  CO2 33*  GLUCOSE 181*  BUN 24*  CREATININE 1.11*  CALCIUM 9.2   PT/INR No results found for this basename: LABPROT, INR,  in the last 72 hours CMP     Component Value Date/Time   NA 133* 10/23/2012 1552   NA 139 05/24/2012 0939   K 5.2* 10/23/2012 1552   K 4.2 05/24/2012 0939   CL 91* 10/23/2012 1552   CL 104 05/24/2012 0939   CO2 33* 10/23/2012 1552   CO2 25 05/24/2012 0939   GLUCOSE 181* 10/23/2012 1552   GLUCOSE 89 05/24/2012 0939   BUN 24* 10/23/2012 1552   BUN 24.0 05/24/2012 0939   CREATININE 1.11* 10/23/2012 1552   CREATININE 0.7 05/24/2012 0939   CALCIUM 9.2 10/23/2012 1552   CALCIUM 8.5 05/24/2012 0939   PROT 6.4 10/19/2012 0705   PROT 6.0* 05/24/2012 0939   ALBUMIN 2.2* 10/19/2012 0705   ALBUMIN 2.6* 05/24/2012 0939   AST 14 10/19/2012 0705   AST 17 05/24/2012 0939   ALT 10 10/19/2012 0705   ALT 12 05/24/2012 0939   ALKPHOS 55 10/19/2012 0705   ALKPHOS 115 05/24/2012 0939   BILITOT 0.1* 10/19/2012 0705   BILITOT 0.39 05/24/2012 0939   GFRNONAA 43* 10/23/2012 1552   GFRAA 50* 10/23/2012 1552   Lipase  No results found for this basename: lipase       Studies/Results: Ct Abdomen  Pelvis Wo Contrast  10/24/2012   *RADIOLOGY REPORT*  Clinical Data: Metastatic adenocarcinoma, malignant ascites  CT ABDOMEN AND PELVIS WITHOUT CONTRAST  Technique:  Multidetector CT imaging of the abdomen and pelvis was performed following the standard protocol without intravenous contrast.  Note:  The study was formed without IV contrast due to poor intravenous access.  Comparison: CT abdomen pelvis dated 07/01/2010.  MR abdomen dated 07/09/2010.  M r pelvis dated 05/30 1012.  Findings: Small right pleural effusion with associated right lower lobe atelectasis.  Calcified granulomata in the liver and spleen.  Unenhanced pancreas and right adrenal gland are within normal limits.  2.4 cm left adrenal nodule, unchanged, previously characterized as a benign adrenal adenoma.  Gallbladder is distended but without associated inflammatory changes. No intrahepatic or extrahepatic ductal dilatation.  Kidneys are unremarkable.  No renal calculi or hydronephrosis.  Multiple dilated loops of small bowel in the left mid abdomen, suggesting partial small bowel obstruction.  Debris within loops of mid/distal small bowel (small bowel feces sign), reflecting associated small bowel stasis.  Suspected transition point beneath the mid  lower abdominal wall (series 2/image 56), at the site of prior hernia mesh repair.  Contrast from prior fluoroscopic study is present within the right colon, indicating that this is not completely obstructive. Colon is largely decompressed with extensive diverticulosis.  Moderate volume abdominopelvic ascites with associated peritoneal disease, including a 12.9 x 3.2 cm implant overlying the liver (series 2/image 24).  Atherosclerotic calcifications of the abdominal aorta and branch vessels.  4.1 x 3.8 cm lesion in the right pelvis (series 2/image 70) possibly reflecting nodal or ovarian metastasis.  Status post hysterectomy.  No adnexal masses.  Bladder is underdistended.  Postsurgical changes in the  anterior abdominal wall with abnormal soft tissue/tumor along a tract in the left mid abdominal wall (series 2/images 60 and 63).  Degenerative changes of the visualized thoracolumbar spine.  IMPRESSION: Suspected early/partial small bowel obstruction with focal transition beneath the mid lower abdominal wall, at the site of prior ventral hernia mesh repair.  Moderate abdominopelvic ascites with associated peritoneal disease.  Additional tumor in the right pelvis and along the left anterior abdominal wall.   Original Report Authenticated By: Charline Bills, M.D.   Dg Abd 1 View  10/23/2012   *RADIOLOGY REPORT*  Clinical Data: Constipation, nausea, vomiting, past history of hernia repair, diabetes, appendiceal cancer  ABDOMEN - 1 VIEW  Comparison: 10/22/2012  Findings: Retained contrast in colon. Persistent mild gaseous distention of small bowel loops in the left mid abdomen compatible with a mid small bowel obstruction. No bowel wall thickening identified. Prior ventral hernia repair. Marked osseous demineralization with multilevel degenerative disc disease changes with thoracolumbar spine. Degenerative changes of the right hip.  IMPRESSION: Persistent mid small bowel obstruction.   Original Report Authenticated By: Ulyses Southward, M.D.   Dg Abd 1 View  10/22/2012   *RADIOLOGY REPORT*  Clinical Data: Nausea, vomiting and constipation.  ABDOMEN - 1 VIEW  Comparison: Abdominal radiograph 07/14/2012.  Findings: Several dilated loops of gas-filled small bowel are noted in the left side of the abdomen measuring up to 4.6 cm in diameter. There does appear to be some gas, stool and oral contrast material scattered throughout the colon including the distal rectum.  No pneumoperitoneum. Markers from a mesh repair for ventral hernia is noted.  IMPRESSION: 1.  Findings concerning for potential early or partial small bowel obstruction.  Clinical correlation is recommended.   Original Report Authenticated By: Trudie Reed,  M.D.    Anti-infectives: Anti-infectives   None       Assessment/Plan  1. Early PSBO, ? Secondary to malignancy or adhesive disease 2. 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 CVA  Plan: 1. I had a long talk with the patient and her daughter today.  The patient clearly states that she does not want aggressive treatment.  She wants to be comfortable and go home to see her husband.  She reiterated to me that she is a DNR.  I then spoke to her daughter separately who also seems to be on the same page.  We discussed trying to relieve some of her constipation today to see if this will also help her abdomen right now.  This is very likely an early PSBO secondary to malignancy, but she is very constipated as well.  Therefore, some enemas and suppositories may make her feel better as well.  We did discuss the potential that if she does not improve and has a complete obstruction, that surgery would not necessarily prolong her life or improve her quality of life.  We  did discuss the possibility of a palliative G-tube if needed.  However, GI and IR typically do not like to place tubes with tumor burden potentially in the way.  This may limit their ability.  An open G-tube would be difficult for many reasons not limited to possible frozen abdomen, prior ventral hernia repair with mesh, and tumor burden in the LUQ limiting the ability to place it.  All of this was discussed.  For now, we will take it day by day and see how things progress.  The patient seems to be doing better today.    LOS: 6 days    Dillan Lunden E 10/24/2012, 9:51 AM Pager: [phone]

## 2012-10-24 NOTE — Progress Notes (Signed)
Speech Language Pathology Daily Session Note  Patient Details  Name: [name] MRN: [id number] Date of Birth: 03/09/1922  Today's Date: 10/24/2012 Time: 1415-1440 Time Calculation (min): 25 min  Short Term Goals: Week 1: SLP Short Term Goal 1 (Week 1): Patient will utilize word finding strategies with Mod I  SLP Short Term Goal 2 (Week 1): Patient will consume Dys.2 textures and thin liquids with minimal overt s/s of aspiration with Supervision verbal cues.  Skilled Therapeutic Interventions: Skilled treatment session focused on addressing dysphagia goals.  Per orders patient NPO except for ice chips; SLP facilitated session with ice chips via teaspoon.  Patient demonstrated intermittent wet vocal quality which she cleared with throat clears with increased wait time.  Patient demonstrated no significant overt s/s of aspiration.  Patient declined any further therapy and as a result, session was ended early.    FIM:  Comprehension Comprehension Mode: Auditory Comprehension: 4-Understands basic 75 - 89% of the time/requires cueing 10 - 24% of the time Expression Expression Mode: Verbal Expression: 3-Expresses basic 50 - 74% of the time/requires cueing 25 - 50% of the time. Needs to repeat parts of sentences. Social Interaction Social Interaction: 5-Interacts appropriately 90% of the time - Needs monitoring or encouragement for participation or interaction. Problem Solving Problem Solving: 5-Solves basic 90% of the time/requires cueing < 10% of the time Memory Memory: 4-Recognizes or recalls 75 - 89% of the time/requires cueing 10 - 24% of the time FIM - Eating Eating Activity: 5: Supervision/cues  Pain Pain Assessment Pain Assessment: No/denies pain  Therapy/Group: Individual Therapy  Charlane Ferretti., CCC-SLP [phone]  Jack Bolio 10/24/2012, 3:02 PM

## 2012-10-24 NOTE — Progress Notes (Signed)
No vomiting this shift. Pt seen by surgical team this a.m.Marland Kitchen New orders for SMOG enema and suppository written to be administered. SMOG enema given at 1630. Pt able to retain enema for 30 minutes. Only expelled one soft brown stool. Rectal vault checked, no stool felt in rectal vault. Pt remains NPO except for ice chips. No drainage noted from umbilicus, dressing to area, clean and dry. Abd girth measured at 47inches at 1400. Husband in to visit pt this shift.

## 2012-10-24 NOTE — Progress Notes (Signed)
Physical Therapy Session Note  Patient Details  Name: [name] MRN: [id number] Date of Birth: [birth year]  Today's Date: 10/24/2012 Did not charge for this session.   Short Term Goals: Week 1:  PT Short Term Goal 1 (Week 1): Pt will perform bed mobility at min assist  PT Short Term Goal 2 (Week 1): Pt will perform transfers at min assist PT Short Term Goal 3 (Week 1): Pt will tolerate 10 mins of activity with single rest break with SaO2 above 90% on RA.   Skilled Therapeutic Interventions/Progress Updates:   Focus of session was providing emotional support for pt as she prepares for end of life.  She states that her husband came to visit her today and that made her very happy because "this may be the last time I see him."  She also states that she is worried about her family and making sure "everything is taken care of."  Spoke with pt regarding her wishes to be home with her husband in her last days.    Therapy Documentation Precautions:  Precautions Precautions: Fall Precaution Comments: moderate arthritis in bilateral shoulders Restrictions Weight Bearing Restrictions: No   Pain: Pain Assessment Pain Assessment: No/denies pain   See FIM for current functional status  Therapy/Group: Individual Therapy  Vista Deck 10/24/2012, 4:12 PM

## 2012-10-24 NOTE — Progress Notes (Signed)
Nursing Note: Inner l forearm pink, nut swelling has greatly decreased.Less red, now looks more pink in color.wbb

## 2012-10-24 NOTE — Progress Notes (Signed)
Nursing Note; Pt 's abd girth was 46 and a half earlier and is now 51 inches, A: Dr, Wynn Banker on unit and made aware. Obtained an order to decrease IVF to 50 cc/hr.Decreased IVF to 50 cc/hr.wbb

## 2012-10-24 NOTE — Plan of Care (Signed)
Problem: RH SAFETY Goal: RH STG ADHERE TO SAFETY PRECAUTIONS W/ASSISTANCE/DEVICE STG Adhere to Safety Precautions With supervision Assistance/Device.  Outcome: Progressing No unsafe behavior. Calls appropriately.

## 2012-10-24 NOTE — Progress Notes (Signed)
IP PROGRESS NOTE  Subjective:  She developed nausea and vomiting over the weekend. No vomiting today. She complains of a dry mouth and hoarseness. She was making progress with physical therapy prior to the onset of vomiting. She underwent a therapeutic paracentesis on 10/20/2012. She is passing flatus.  Objective: Vital signs in last 24 hours: Blood pressure 122/72, pulse 104, temperature 97.2 F (36.2 C), temperature source Oral, resp. rate 18, height 5\' 1"  (1.549 m), weight 163 lb 2.3 oz (74 kg), SpO2 96.00%.  Intake/Output from previous day: 09/08 0701 - 09/09 0700 In: 720 [P.O.:720] Out: -   Physical Exam: HEENT: The mouth is dry Lungs: Clear anteriorly Cardiac: Regular rate and rhythm Abdomen:  Distended with multiple abdominal masses Extremities: No leg edema Neurologic: Alert and oriented    Lab Results:  Recent Labs  10/23/12 1552  WBC 11.1*  HGB 10.4*  HCT 35.3*  PLT 344    BMET  Recent Labs  10/23/12 1552  NA 133*  K 5.2*  CL 91*  CO2 33*  GLUCOSE 181*  BUN 24*  CREATININE 1.11*  CALCIUM 9.2    Studies/Results: Ct Abdomen Pelvis Wo Contrast  10/24/2012   *RADIOLOGY REPORT*  Clinical Data: Metastatic adenocarcinoma, malignant ascites  CT ABDOMEN AND PELVIS WITHOUT CONTRAST  Technique:  Multidetector CT imaging of the abdomen and pelvis was performed following the standard protocol without intravenous contrast.  Note:  The study was formed without IV contrast due to poor intravenous access.  Comparison: CT abdomen pelvis dated 07/01/2010.  MR abdomen dated 07/09/2010.  M r pelvis dated 05/30 1012.  Findings: Small right pleural effusion with associated right lower lobe atelectasis.  Calcified granulomata in the liver and spleen.  Unenhanced pancreas and right adrenal gland are within normal limits.  2.4 cm left adrenal nodule, unchanged, previously characterized as a benign adrenal adenoma.  Gallbladder is distended but without associated inflammatory  changes. No intrahepatic or extrahepatic ductal dilatation.  Kidneys are unremarkable.  No renal calculi or hydronephrosis.  Multiple dilated loops of small bowel in the left mid abdomen, suggesting partial small bowel obstruction.  Debris within loops of mid/distal small bowel (small bowel feces sign), reflecting associated small bowel stasis.  Suspected transition point beneath the mid lower abdominal wall (series 2/image 56), at the site of prior hernia mesh repair.  Contrast from prior fluoroscopic study is present within the right colon, indicating that this is not completely obstructive. Colon is largely decompressed with extensive diverticulosis.  Moderate volume abdominopelvic ascites with associated peritoneal disease, including a 12.9 x 3.2 cm implant overlying the liver (series 2/image 24).  Atherosclerotic calcifications of the abdominal aorta and branch vessels.  4.1 x 3.8 cm lesion in the right pelvis (series 2/image 70) possibly reflecting nodal or ovarian metastasis.  Status post hysterectomy.  No adnexal masses.  Bladder is underdistended.  Postsurgical changes in the anterior abdominal wall with abnormal soft tissue/tumor along a tract in the left mid abdominal wall (series 2/images 60 and 63).  Degenerative changes of the visualized thoracolumbar spine.  IMPRESSION: Suspected early/partial small bowel obstruction with focal transition beneath the mid lower abdominal wall, at the site of prior ventral hernia mesh repair.  Moderate abdominopelvic ascites with associated peritoneal disease.  Additional tumor in the right pelvis and along the left anterior abdominal wall.   Original Report Authenticated By: Charline Bills, M.D.   Dg Abd 1 View  10/23/2012   *RADIOLOGY REPORT*  Clinical Data: Constipation, nausea, vomiting, past history of  hernia repair, diabetes, appendiceal cancer  ABDOMEN - 1 VIEW  Comparison: 10/22/2012  Findings: Retained contrast in colon. Persistent mild gaseous distention  of small bowel loops in the left mid abdomen compatible with a mid small bowel obstruction. No bowel wall thickening identified. Prior ventral hernia repair. Marked osseous demineralization with multilevel degenerative disc disease changes with thoracolumbar spine. Degenerative changes of the right hip.  IMPRESSION: Persistent mid small bowel obstruction.   Original Report Authenticated By: Ulyses Southward, M.D.    Medications: I have reviewed the patient's current medications.  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  -Xeloda resumed August 2013, restaging CT 03/06/2012 confirmed disease progression  -initiation of every 2 week panitumumab 04/26/2012. She completed 3 treatments.  2. Diabetes  3. Sleep apnea  4. Depression  5. History of a Skin rash secondary to panitumumab.  6. Pitting edema of the lower legs-likely related to abdominal carcinomatosis and hypoalbuminemia. Improved.  7. 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 Now on a Duragesic patch 8. Ascites. She requires periodic therapeutic paracentesis procedures, last on 10/20/2012   9. Exertional dyspnea-the dyspnea improves after a paracentesis. The dyspnea is most likely related to abdominal distention and atelectasis.  10. Left leg "tightness".Negative left leg Doppler on 09/06/2012 11.admission with an acute CVA on 10/13/2012-? Embolic,? [place] endocarditis-now on the rehabilitation medicine service.  12. Nausea/vomiting developing on 10/21/2012-probable partial small bowel obstruction based on clinical history and imaging studies   She developed nausea/vomiting over the weekend and plain x-rays/CT are suggestive of a bowel obstruction. She is at high risk for  bowel obstruction in the setting of abdominal carcinomatosis. Ms. Imhoff is not a surgical candidate. I discussed the situation with the patient and her family. The goal is comfort care. We can consider placement of a nasogastric tube or palliative gastrostomy if the nausea persists.  Ms. Anthis and her family desire for her to return home. We also discussed the option of [employer].  Recommendations:   1. Continue comfort care measures 2. Advanced to a liquid diet as tolerated if okay with Dr. Magnus Ivan 3. Palliative NG tube or gastrostomy tube for intractable nausea/vomiting  I will check on her over the next few days and consider transferring her to the oncology service if she is unable to participate in physical therapy.      LOS: 6 days   Ketty Bitton, Jillyn Hidden  10/24/2012, 5:56 PM

## 2012-10-25 ENCOUNTER — Inpatient Hospital Stay (HOSPITAL_COMMUNITY): Admitting: Rehabilitation

## 2012-10-25 ENCOUNTER — Inpatient Hospital Stay (HOSPITAL_COMMUNITY): Admitting: Speech Pathology

## 2012-10-25 ENCOUNTER — Inpatient Hospital Stay (HOSPITAL_COMMUNITY)

## 2012-10-25 ENCOUNTER — Inpatient Hospital Stay (HOSPITAL_COMMUNITY): Admitting: Occupational Therapy

## 2012-10-25 DIAGNOSIS — K5669 Other intestinal obstruction: Secondary | ICD-10-CM

## 2012-10-25 LAB — BASIC METABOLIC PANEL
Calcium: 8.9 mg/dL (ref 8.4–10.5)
GFR calc Af Amer: 84 mL/min — ABNORMAL LOW (ref >90)
GFR calc non Af Amer: 73 mL/min — ABNORMAL LOW (ref >90)
Glucose, Bld: 142 mg/dL — ABNORMAL HIGH (ref 70–99)
Potassium: 4.8 mEq/L (ref 3.5–5.1)
Sodium: 128 mEq/L — ABNORMAL LOW (ref 135–145)

## 2012-10-25 MED ORDER — SORBITOL 70 % SOLN
960.0000 mL | TOPICAL_OIL | Freq: Once | ORAL | Status: AC
  Administered 2012-10-25: 960 mL via RECTAL
  Filled 2012-10-25: qty 240

## 2012-10-25 NOTE — Progress Notes (Signed)
Speech Language Pathology Daily Session Note  Patient Details  Name: [name] MRN: [id number] Date of Birth: 01-05-1923  Today's Date: 10/25/2012 Time: 4098-1191 Time Calculation (min): 40 min  Short Term Goals: Week 1: SLP Short Term Goal 1 (Week 1): Patient will utilize word finding strategies with Mod I  SLP Short Term Goal 2 (Week 1): Patient will consume Dys.2 textures and thin liquids with minimal overt s/s of aspiration with Supervision verbal cues.  Skilled Therapeutic Interventions: Skilled treatment session focused on addressing dysphagia goals.  Per orders patient now able to consume clear liquids; patient reported a desire to have lunch.  SLP facilitated session with proper positioning in bed and increased wait time to set-up tray.  Patient consumed liquids via cup with chin tuck and cough and required cues to wait until suspected penetrates clear until continuing p.o.  Despite SLP explaining rationale to patient she was frustrated with cues for pacing throughout session.  Cues to utilize head neutral position and teaspoon sips prevent s/s of aspiration with liquids; however these cues were not effective at preventing coughing Jello.  As a result, it was removed from tray.  Continue with current plan of care.     FIM:  Comprehension Comprehension Mode: Auditory Comprehension: 3-Understands basic 50 - 74% of the time/requires cueing 25 - 50%  of the time Expression Expression Mode: Verbal Expression: 4-Expresses basic 75 - 89% of the time/requires cueing 10 - 24% of the time. Needs helper to occlude trach/needs to repeat words. Social Interaction Social Interaction: 3-Interacts appropriately 50 - 74% of the time - May be physically or verbally inappropriate. Problem Solving Problem Solving: 3-Solves basic 50 - 74% of the time/requires cueing 25 - 49% of the time Memory Memory: 2-Recognizes or recalls 25 - 49% of the time/requires cueing 51 - 75% of the time FIM -  Eating Eating Activity: 5: Supervision/cues  Pain Pain Assessment Pain Assessment: No/denies pain  Therapy/Group: Individual Therapy  Charlane Ferretti., CCC-SLP [phone]  [name] 10/25/2012, 3:47 PM

## 2012-10-25 NOTE — Plan of Care (Signed)
Problem: RH BOWEL ELIMINATION Goal: RH STG MANAGE BOWEL W/MEDICATION W/ASSISTANCE STG Manage Bowel with Medication with min Assistance.  Outcome: Not Progressing NPO for meds clear liquids only

## 2012-10-25 NOTE — Patient Care Conference (Signed)
Inpatient RehabilitationTeam Conference and Plan of Care Update Date: 10/25/2012   Time: 11;50 Am    Patient Name: [name]      Medical Record Number: [id number]  Date of Birth: [birth year] Sex: Female         Room/Bed: 4W25C/4W25C-01 Payor Info: Payor: [employer] [place] [employer] / Plan: BLUE MEDICARE / Product Type: *No Product type* /    Admitting Diagnosis: CVA  Admit Date/Time:  10/18/2012  4:39 PM Admission Comments: No comment available   Primary Diagnosis:  Embolic cerebral infarction Principal Problem: Embolic cerebral infarction  Patient Active Problem List   Diagnosis Date Noted  . Embolic cerebral infarction 10/18/2012  . CVA (cerebral infarction) 10/13/2012  . 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 DVT 02/22/2007  . BRONCHITIS 02/22/2007  . SLEEP APNEA 02/22/2007  . DYSPNEA 02/22/2007    Expected Discharge Date:    Team Members Present: Physician leading conference: Dr. Claudette Laws Social Worker Present: Staci Acosta, LCSW;Becky Kameah Rawl, LCSW Nurse Present: Gregor Hams, RN PT Present: Edman Circle, PT;Bridgett Ripa, Clearnce Hasten, PT OT Present: Rosalio Loud, OT;Kris Jacklynn Lewis, OT;Kayla Perkinson, Heath Lark, OT SLP Present: Fae Pippin, Charlie Pitter, SLP PPS Coordinator present : Edson Snowball, Chapman Fitch, RN, CRRN     Current Status/Progress Goal Weekly Team Focus  Medical   Partial SBO secondary to carcinomatosis, tolerating therapy at a reduced level  take po  prepare pt adn family to D/C back home with hospice   Bowel/Bladder   Obstructive bowel. Requiring SMOG enema. LBM 10/22/12 small result after soap sud enema. Incontinent of bladder. wears brief  Managed bowel  Pt npo at this time. Continue with laxatives and enemas   Swallow/Nutrition/ Hydration   clear liquid diet   least restrictive p.o. intake   carryover of monitoring and correctng wet vocal quality   ADL's   Pt is currently mod assist for toilet transfers  with max assist for hygiene and clothing.  She is able to perform UB dressing with supervision and LB dressing with min assist.  She needs mod to max assist for LB drressing.  Goals are min assis to supervision for dressing, bathing, toileting, and shower transfers.  AE education, selfcare retraining, balance, endurance, UE AROM   Mobility   Pt is currently min to mod assist for bed mobility, min assist for transfers and ambulation  Goals are set for supervision, however pt may be moving towards comfort measures due to prognosis.   activity tolerance, balance, ambulation, safety, goals of care, pt/family education   Communication   Supervision   Mod I   determine patient and family goals; education    Safety/Cognition/ Behavioral Observations  WFL with basic         Pain   No c/o pain  <3  Monitor   Skin   Umbilicus red, with scant amount of white milky drainage  No additional skin breakdown  Monitor umbilicus site      *See Care Plan and progress notes for long and short-term goals.  Barriers to Discharge: not taking solid foodadvance diet as tolerated    Possible Resolutions to Barriers:  Gen Surgery, Palliative adn Onc  to assist    Discharge Planning/Teaching Needs:  Working with pt and daughter on a plan.  daughter wants to feel comfortable with pt's bowel issues.  Discussed hoje with hospice [facility]      Team Discussion:  Pt feeling better today trying clear liquids.  RN working bowel  issues.  MD feels will know more in two days-medically  Revisions to Treatment Plan:  Need a direction to proceed with a discharge plan   Continued Need for Acute Rehabilitation Level of Care: The patient requires daily medical management by a physician with specialized training in physical medicine and rehabilitation for the following conditions: Daily direction of a multidisciplinary physical rehabilitation program to ensure safe treatment while eliciting the highest outcome that  is of practical value to the patient.: Yes Daily medical management of patient stability for increased activity during participation in an intensive rehabilitation regime.: Yes Daily analysis of laboratory values and/or radiology reports with any subsequent need for medication adjustment of medical intervention for : Neurological problems;Other  Lucy Chris 11/08/2012, 8:47 AM

## 2012-10-25 NOTE — Progress Notes (Signed)
Nursing Note: Measured abd girth  for 50 inches at begin of shift and for 48 at the end of the shift.Pt to radiology.wbb

## 2012-10-25 NOTE — Progress Notes (Signed)
Social Work Patient ID: [name], female   DOB: 06-16-1922, 79 y.o.   MRN: [id number] Met with pt and daughter when here this am.  Daughter's main concern is being able to manage mom's bowel issues and that all has been tried  Before decision is made regarding discharge.  Pt is feeling somewhat better to day and trying clear liquids.  RN working on bowels and will see if successful. Moving toward education and disposition, awaiting pt's and families decision.  Palliative MD to consult also-MD wrote order.  Will get hospice to resume upon discharge.

## 2012-10-25 NOTE — Progress Notes (Signed)
Patient ID: [name], female   DOB: Mar 28, 1922, 79 y.o.   MRN: [id number] 79 y.o. right-handed female with history of metastatic adenocarcinoma diagnosed 2012.previous chemotherapy treatments but stopped due to progression of her cancer, malignant ascites, diabetes mellitus peripheral neuropathy. Patient independent with rolling walker prior to admission. Admitted 10/13/2012 after a fall with noted slurred speech and left-sided weakness. MRI of the brain showed scattered small acute posterior right MCA territory infarcts. Several tiny acute infarcts also in the posterior left MCA territory. MRA of the head negative. Echocardiogram with ejection fraction of 55% normal systolic function. Carotid Dopplers with no ICA stenosis. Patient did not receive TPA. Neurology services consulted placed on aspirin therapy for CVA prophylaxis. Speech therapy for dysphagia currently maintained on a dysphagia 2 thin liquid diet. Noted chronic anemia hemoglobin 6.8 patient has been transfused with latest hemoglobin 10.1. Patient is being followed by hospice care for history of progressive metastatic adenocarcinoma. She does undergo weekly therapeutic paracentesis for chronic ascites and followed by oncology services Dr. Arnoldo Lenis.   Subjective/Complaints: minimal results from SMOG enema.  Pt has done >2 hours therapy yesterday, limited by nausea No vomiting overnite Case discussed with Dr Truett Perna as well as pt and daughter today Gen Surgery on consult  A 12 point review of systems has been performed and if not noted above is otherwise negative. . Objective: Vital Signs: Blood pressure 137/80, pulse 103, temperature 97.5 F (36.4 C), temperature source Oral, resp. rate 17, height 5\' 1"  (1.549 m), weight 74 kg (163 lb 2.3 oz), SpO2 91.00%. Ct Abdomen Pelvis Wo Contrast  10/24/2012   *RADIOLOGY REPORT*  Clinical Data: Metastatic adenocarcinoma, malignant ascites  CT ABDOMEN AND PELVIS WITHOUT CONTRAST  Technique:   Multidetector CT imaging of the abdomen and pelvis was performed following the standard protocol without intravenous contrast.  Note:  The study was formed without IV contrast due to poor intravenous access.  Comparison: CT abdomen pelvis dated 07/01/2010.  MR abdomen dated 07/09/2010.  M r pelvis dated 05/30 1012.  Findings: Small right pleural effusion with associated right lower lobe atelectasis.  Calcified granulomata in the liver and spleen.  Unenhanced pancreas and right adrenal gland are within normal limits.  2.4 cm left adrenal nodule, unchanged, previously characterized as a benign adrenal adenoma.  Gallbladder is distended but without associated inflammatory changes. No intrahepatic or extrahepatic ductal dilatation.  Kidneys are unremarkable.  No renal calculi or hydronephrosis.  Multiple dilated loops of small bowel in the left mid abdomen, suggesting partial small bowel obstruction.  Debris within loops of mid/distal small bowel (small bowel feces sign), reflecting associated small bowel stasis.  Suspected transition point beneath the mid lower abdominal wall (series 2/image 56), at the site of prior hernia mesh repair.  Contrast from prior fluoroscopic study is present within the right colon, indicating that this is not completely obstructive. Colon is largely decompressed with extensive diverticulosis.  Moderate volume abdominopelvic ascites with associated peritoneal disease, including a 12.9 x 3.2 cm implant overlying the liver (series 2/image 24).  Atherosclerotic calcifications of the abdominal aorta and branch vessels.  4.1 x 3.8 cm lesion in the right pelvis (series 2/image 70) possibly reflecting nodal or ovarian metastasis.  Status post hysterectomy.  No adnexal masses.  Bladder is underdistended.  Postsurgical changes in the anterior abdominal wall with abnormal soft tissue/tumor along a tract in the left mid abdominal wall (series 2/images 60 and 63).  Degenerative changes of the visualized  thoracolumbar spine.  IMPRESSION: Suspected  early/partial small bowel obstruction with focal transition beneath the mid lower abdominal wall, at the site of prior ventral hernia mesh repair.  Moderate abdominopelvic ascites with associated peritoneal disease.  Additional tumor in the right pelvis and along the left anterior abdominal wall.   Original Report Authenticated By: Charline Bills, M.D.   Dg Abd 1 View  10/23/2012   *RADIOLOGY REPORT*  Clinical Data: Constipation, nausea, vomiting, past history of hernia repair, diabetes, appendiceal cancer  ABDOMEN - 1 VIEW  Comparison: 10/22/2012  Findings: Retained contrast in colon. Persistent mild gaseous distention of small bowel loops in the left mid abdomen compatible with a mid small bowel obstruction. No bowel wall thickening identified. Prior ventral hernia repair. Marked osseous demineralization with multilevel degenerative disc disease changes with thoracolumbar spine. Degenerative changes of the right hip.  IMPRESSION: Persistent mid small bowel obstruction.   Original Report Authenticated By: Ulyses Southward, M.D.   Dg Abd 2 Views  10/25/2012   *RADIOLOGY REPORT*  Clinical Data: Follow-up small bowel obstruction.  Abdominal pain.  ABDOMEN - 2 VIEW  Comparison: CT, 10/23/2012  Findings: Small bowel dilation has decreased when compared to the prior CT, although there is still small bowel dilation and air- fluid levels.  This suggests an improved, but not resolved, partial small bowel obstruction.  There is no free air  Contrast mixed with stool is seen throughout a nondistended colon.  IMPRESSION: Improved, but unresolved, partial small bowel obstruction.   Original Report Authenticated By: Amie Portland, M.D.   Results for orders placed during the hospital encounter of 10/18/12 (from the past 72 hour(s))  CBC     Status: Abnormal   Collection Time    10/23/12  3:52 PM      Result Value Range   WBC 11.1 (*) 4.0 - 10.5 K/uL   RBC 4.49  3.87 - 5.11  MIL/uL   Hemoglobin 10.4 (*) 12.0 - 15.0 g/dL   HCT 11.9 (*) 14.7 - 82.9 %   MCV 78.6  78.0 - 100.0 fL   MCH 23.2 (*) 26.0 - 34.0 pg   MCHC 29.5 (*) 30.0 - 36.0 g/dL   RDW 56.2 (*) 13.0 - 86.5 %   Platelets 344  150 - 400 K/uL  BASIC METABOLIC PANEL     Status: Abnormal   Collection Time    10/23/12  3:52 PM      Result Value Range   Sodium 133 (*) 135 - 145 mEq/L   Potassium 5.2 (*) 3.5 - 5.1 mEq/L   Chloride 91 (*) 96 - 112 mEq/L   CO2 33 (*) 19 - 32 mEq/L   Glucose, Bld 181 (*) 70 - 99 mg/dL   BUN 24 (*) 6 - 23 mg/dL   Creatinine, Ser 7.84 (*) 0.50 - 1.10 mg/dL   Calcium 9.2  8.4 - 69.6 mg/dL   GFR calc non Af Amer 43 (*) >90 mL/min   GFR calc Af Amer 50 (*) >90 mL/min   Comment: (NOTE)     The eGFR has been calculated using the CKD EPI equation.     This calculation has not been validated in all clinical situations.     eGFR's persistently <90 mL/min signify possible Chronic Kidney     Disease.     HEENT: normal and no drainage Cardio: RRR and no murmurs Resp: CTA B/L and unlabored GI: BS infrequent but present,  mild distension persistent. Non-tender in Lower quadrants, tenderness and fullness in epigastric area Extremity:  Pulses positive and  No Edema Skin:   Intact Neuro: Alert/Oriented, Flat, Normal Sensory and Normal Motor Musc/Skel:  Normal Gen NAD   Assessment/Plan: 1. Functional deficits secondary to Post R MCA infarct which require 3+ hours per day of interdisciplinary therapy in a comprehensive inpatient rehab setting. Physiatrist is providing close team supervision and 24 hour management of active medical problems listed below. Physiatrist and rehab team continue to assess barriers to discharge/monitor patient progress toward functional and medical goals.  Currently nauseated. No vomiting currently.  PT/OT as pt tolerates, activity should help with bowels Reduced fentanyl patch without increased pain Still doing at least 2hours of therapy per  day  Discuss with team today FIM: FIM - Bathing Bathing Steps Patient Completed: Chest;Right Arm;Left Arm;Abdomen;Right upper leg;Left upper leg;Right lower leg (including foot);Left lower leg (including foot) Bathing: 4: Min-Patient completes 8-9 52f 10 parts or 75+ percent  FIM - Upper Body Dressing/Undressing Upper body dressing/undressing steps patient completed: Thread/unthread right sleeve of front closure shirt/dress Upper body dressing/undressing: 2: Max-Patient completed 25-49% of tasks FIM - Lower Body Dressing/Undressing Lower body dressing/undressing steps patient completed: Thread/unthread left pants leg;Don/Doff right sock;Don/Doff left sock;Don/Doff right shoe;Don/Doff left shoe;Fasten/unfasten right shoe;Fasten/unfasten left shoe Lower body dressing/undressing: 3: Mod-Patient completed 50-74% of tasks  FIM - Toileting Toileting steps completed by patient: Adjust clothing prior to toileting;Performs perineal hygiene Toileting Assistive Devices:  (walker) Toileting: 0: Activity did not occur  FIM - Diplomatic Services operational officer Devices: Environmental consultant;Bedside commode Toilet Transfers: 4-To toilet/BSC: Min A (steadying Pt. > 75%);4-From toilet/BSC: Min A (steadying Pt. > 75%)  FIM - Bed/Chair Transfer Bed/Chair Transfer Assistive Devices: Walker;HOB elevated;Bed rails Bed/Chair Transfer: 3: Supine > Sit: Mod A (lifting assist/Pt. 50-74%/lift 2 legs  FIM - Locomotion: Wheelchair Distance: 10' Locomotion: Wheelchair: 0: Activity did not occur FIM - Locomotion: Ambulation Locomotion: Ambulation Assistive Devices: Designer, industrial/product Ambulation/Gait Assistance: 4: Min assist Locomotion: Ambulation: 1: Travels less than 50 ft with minimal assistance (Pt.>75%)  Comprehension Comprehension Mode: Auditory Comprehension: 3-Understands basic 50 - 74% of the time/requires cueing 25 - 50%  of the time  Expression Expression Mode: Verbal Expression: 3-Expresses basic 50 -  74% of the time/requires cueing 25 - 50% of the time. Needs to repeat parts of sentences.  Social Interaction Social Interaction: 3-Interacts appropriately 50 - 74% of the time - May be physically or verbally inappropriate.  Problem Solving Problem Solving: 2-Solves basic 25 - 49% of the time - needs direction more than half the time to initiate, plan or complete simple activities  Memory Memory: 4-Recognizes or recalls 75 - 89% of the time/requires cueing 10 - 24% of the time   Medical Problem List and Plan:  1. Embolic bi cerebral infarcts  2. DVT Prophylaxis/Anticoagulation: SCDs. Monitor for any signs of DVT  3. Pain Management: Duragesic patch 12.5 mcg change every 72 hours, , Lidoderm patch, Ultram as needed. Has prn MSO4 as well Monitor with increased activity  4. Mood: Wellbutrin 100 mg daily, Effexor 75 mg daily. Provide emotional support  5. Neuropsych: This patient is capable of making decisions on her own behalf.  6. Metastatic adenocarcinoma of colon with malignant ascites. Followup oncology services.  Patient receives weekly paracentesis for malignant ascites per interventional radiology-7. Dysphagia. Dysphagia 2 nectar liquids. Followup speech therapy. Monitor for any aspiration  8. Chronic anemia. Patient has been transfused.following clinically --9.9 9. Hypertension. Coreg 3.125 mg twice a day. Monitor with increased activity  10. Obstructive sleep apnea. Continue CPAP  11. Diabetes mellitus  with peripheral neuropathy. She is presently off her Glucophage 500 mg twice a day as prior to admission. Hemoglobin A1c 6.2. Check blood sugars a.c. and at bedtime  12. Constipation and likely associated nause/vomiting-possible early partial SBO, clinically non distended and non tender in LQs, chronic epigastric surgery to f/u with recs    -prn rx of nausea IV Zofran until not vomiting    LOS (Days) 7 A FACE TO FACE EVALUATION WAS PERFORMED  KIRSTEINS,ANDREW E 10/25/2012, 11:16  AM

## 2012-10-25 NOTE — Progress Notes (Signed)
Subjective: Pt reports feeling somewhat better today. No abdominal pain, nausea, or vomiting. Had a few "pea-sized" bowel movements yesterday. Continues to stress that she wants to go home. She does have some questions about potential surgeries.  Objective: Vital signs in last 24 hours: Temp:  [97.5 F (36.4 C)] 97.5 F (36.4 C) (09/10 0548) Pulse Rate:  [103-107] 103 (09/10 0548) Resp:  [17-19] 17 (09/10 0548) BP: (137-141)/(80-88) 137/80 mmHg (09/10 0548) SpO2:  [91 %-97 %] 91 % (09/10 0548) Last BM Date: 10/17/12  Intake/Output from previous day: 09/09 0701 - 09/10 0700 In: 0  Out: 1 [Urine:1] Intake/Output this shift:    PE: Abd: soft. Several masses palpable throughout the abdominal wall. Mild tenderness to palpation in R epigastric area, otherwise nontender.  Lab Results:   Recent Labs  10/23/12 1552  WBC 11.1*  HGB 10.4*  HCT 35.3*  PLT 344   BMET  Recent Labs  10/23/12 1552  NA 133*  K 5.2*  CL 91*  CO2 33*  GLUCOSE 181*  BUN 24*  CREATININE 1.11*  CALCIUM 9.2   PT/INR No results found for this basename: LABPROT, INR,  in the last 72 hours CMP     Component Value Date/Time   NA 133* 10/23/2012 1552   NA 139 05/24/2012 0939   K 5.2* 10/23/2012 1552   K 4.2 05/24/2012 0939   CL 91* 10/23/2012 1552   CL 104 05/24/2012 0939   CO2 33* 10/23/2012 1552   CO2 25 05/24/2012 0939   GLUCOSE 181* 10/23/2012 1552   GLUCOSE 89 05/24/2012 0939   BUN 24* 10/23/2012 1552   BUN 24.0 05/24/2012 0939   CREATININE 1.11* 10/23/2012 1552   CREATININE 0.7 05/24/2012 0939   CALCIUM 9.2 10/23/2012 1552   CALCIUM 8.5 05/24/2012 0939   PROT 6.4 10/19/2012 0705   PROT 6.0* 05/24/2012 0939   ALBUMIN 2.2* 10/19/2012 0705   ALBUMIN 2.6* 05/24/2012 0939   AST 14 10/19/2012 0705   AST 17 05/24/2012 0939   ALT 10 10/19/2012 0705   ALT 12 05/24/2012 0939   ALKPHOS 55 10/19/2012 0705   ALKPHOS 115 05/24/2012 0939   BILITOT 0.1* 10/19/2012 0705   BILITOT 0.39 05/24/2012 0939   GFRNONAA 43* 10/23/2012 1552   GFRAA 50*  10/23/2012 1552   Lipase  No results found for this basename: lipase       Studies/Results: Ct Abdomen Pelvis Wo Contrast  10/24/2012   *RADIOLOGY REPORT*  Clinical Data: Metastatic adenocarcinoma, malignant ascites  CT ABDOMEN AND PELVIS WITHOUT CONTRAST  Technique:  Multidetector CT imaging of the abdomen and pelvis was performed following the standard protocol without intravenous contrast.  Note:  The study was formed without IV contrast due to poor intravenous access.  Comparison: CT abdomen pelvis dated 07/01/2010.  MR abdomen dated 07/09/2010.  M r pelvis dated 05/30 1012.  Findings: Small right pleural effusion with associated right lower lobe atelectasis.  Calcified granulomata in the liver and spleen.  Unenhanced pancreas and right adrenal gland are within normal limits.  2.4 cm left adrenal nodule, unchanged, previously characterized as a benign adrenal adenoma.  Gallbladder is distended but without associated inflammatory changes. No intrahepatic or extrahepatic ductal dilatation.  Kidneys are unremarkable.  No renal calculi or hydronephrosis.  Multiple dilated loops of small bowel in the left mid abdomen, suggesting partial small bowel obstruction.  Debris within loops of mid/distal small bowel (small bowel feces sign), reflecting associated small bowel stasis.  Suspected transition point beneath the mid  lower abdominal wall (series 2/image 56), at the site of prior hernia mesh repair.  Contrast from prior fluoroscopic study is present within the right colon, indicating that this is not completely obstructive. Colon is largely decompressed with extensive diverticulosis.  Moderate volume abdominopelvic ascites with associated peritoneal disease, including a 12.9 x 3.2 cm implant overlying the liver (series 2/image 24).  Atherosclerotic calcifications of the abdominal aorta and branch vessels.  4.1 x 3.8 cm lesion in the right pelvis (series 2/image 70) possibly reflecting nodal or ovarian  metastasis.  Status post hysterectomy.  No adnexal masses.  Bladder is underdistended.  Postsurgical changes in the anterior abdominal wall with abnormal soft tissue/tumor along a tract in the left mid abdominal wall (series 2/images 60 and 63).  Degenerative changes of the visualized thoracolumbar spine.  IMPRESSION: Suspected early/partial small bowel obstruction with focal transition beneath the mid lower abdominal wall, at the site of prior ventral hernia mesh repair.  Moderate abdominopelvic ascites with associated peritoneal disease.  Additional tumor in the right pelvis and along the left anterior abdominal wall.   Original Report Authenticated By: Charline Bills, M.D.   Dg Abd 1 View  10/23/2012   *RADIOLOGY REPORT*  Clinical Data: Constipation, nausea, vomiting, past history of hernia repair, diabetes, appendiceal cancer  ABDOMEN - 1 VIEW  Comparison: 10/22/2012  Findings: Retained contrast in colon. Persistent mild gaseous distention of small bowel loops in the left mid abdomen compatible with a mid small bowel obstruction. No bowel wall thickening identified. Prior ventral hernia repair. Marked osseous demineralization with multilevel degenerative disc disease changes with thoracolumbar spine. Degenerative changes of the right hip.  IMPRESSION: Persistent mid small bowel obstruction.   Original Report Authenticated By: Ulyses Southward, M.D.   Dg Abd 2 Views  10/25/2012   *RADIOLOGY REPORT*  Clinical Data: Follow-up small bowel obstruction.  Abdominal pain.  ABDOMEN - 2 VIEW  Comparison: CT, 10/23/2012  Findings: Small bowel dilation has decreased when compared to the prior CT, although there is still small bowel dilation and air- fluid levels.  This suggests an improved, but not resolved, partial small bowel obstruction.  There is no free air  Contrast mixed with stool is seen throughout a nondistended colon.  IMPRESSION: Improved, but unresolved, partial small bowel obstruction.   Original Report  Authenticated By: Amie Portland, M.D.    Anti-infectives: Anti-infectives   None       Assessment/Plan  A:  1. PSBO - improved clinically and by imaging, ? Secondary to malignancy or adhesive disease 2. 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 CVA 4. Constipation  P: As PSBO seems to be improving, may give clear liquids today. Will also repeat SMOG enema for constipation. Will defer surgical questions to Dr. Magnus Ivan, advised pt and her daughter that he will be by later today.   LOS: 7 days   Levert Feinstein 10/25/2012, 8:28 AM PA Pager: [phone]

## 2012-10-25 NOTE — Progress Notes (Addendum)
Physical Therapy Weekly Progress Note  Patient Details  Name: [name] MRN: [id number] Date of Birth: 09/08/1922  Today's Date: 10/25/2012 Time: 4098-1191 Time Calculation (min): 57 min  Patient has met 2 of 3 short term goals.  Pt able to meet bed mobility and transfer goals of min assist in first week, however she continues to require oxygen and feel that she will need it at D/C, therefore goal is no longer applicable.  Feel that goals for her care are trending towards comfort, however she continues to want to attempt to participate in therapy.  It is unclear whether or not she will be going home at D/C to continue follow up with hospice or D/C to SNF for decreased burden of care.  Discussed options with pt and she states her and family will decide later this evening.  Also discussed what equipment she may need if D/C home.    Patient continues to demonstrate the following deficits: decreased activity tolerance, decreased strength (generalized), decreased balance, decreased safety  and therefore will continue to benefit from skilled PT intervention to enhance overall performance with activity tolerance, balance, postural control, ability to compensate for deficits, coordination and knowledge of precautions.  Patient progressing toward long term goals..  Continue plan of care.  Will continue to address plan of care as D/c plans are more concrete.   PT Short Term Goals Week 1:  PT Short Term Goal 1 (Week 1): Pt will perform bed mobility at min assist  PT Short Term Goal 1 - Progress (Week 1): Met PT Short Term Goal 2 (Week 1): Pt will perform transfers at min assist PT Short Term Goal 2 - Progress (Week 1): Met PT Short Term Goal 3 (Week 1): Pt will tolerate 10 mins of activity with single rest break with SaO2 above 90% on RA.  PT Short Term Goal 3 - Progress (Week 1): Not met Week 2:  PT Short Term Goal 1 (Week 2): STGs=LTG's  Skilled Therapeutic Interventions/Progress Updates:   Pt  received in room in bed with daughter and Child psychotherapist.  Pt and daughter states she wants to participate as much as possible.  She does state that she needs to use the restroom, therefore pt agreed to ambulate to/from restroom this morning.  Ambulated approx 10' x 1 in room with RW at min assist with cues for continued pursed lip breathing, upright posture and increased stride length.  Upon reaching restroom, pt able to use R UE on grab bar for support and LUE to pull clothing down.  She does require assist to get clothing down fully.  Min/guard required to steadying while standing.  Note she did have a very small bowel movement and RN notified during session.  Ambulated approx 5' out of restroom, however unable to make it all the way back to bed due to increased fatigue, therefore chair pulled up for seated rest break.  SaO2 following ambulation on 3L O2 was 88%.  Cues for pursed lip breathing with SaO2 increasing to 90%.  RN notified.  Performed seated LAQs x 10 reps each, 5 sit <> stands for increased LE strengthening at supervision level with min cues for completely upright posture.  Requires extended rest breaks in between each activity due to fatigue and SOB.  Discussed equipment needs at D/C if plan is home.  Feel that she will need hospital bed and RW.  She states she has another w/c and also has bedside commode.  Left pt in recliner in  room with all needs in place.    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     See FIM for current functional status  Therapy/Group: Individual  Therapy  Vista Deck 10/25/2012, 9:39 AM

## 2012-10-25 NOTE — Progress Notes (Signed)
I have seen and examined the patient and agree with the assessment and plans.  I had a long discussion with Ms. Riviello's daughter.  We will try to continue conservative management and relieve her constipation.  Given the recent CT, I think an exploratory laparotomy would only minimally temporize her situation.  Given the tumor burden, I may not even be able place a gastrostomy tube. She is definitely against surgery for now, but is struggling with the ultimate outcome  Maite Burlison A. Magnus Ivan  MD, FACS

## 2012-10-25 NOTE — Progress Notes (Signed)
Patient had a loose brown bowel movement. [place] PO held as per MD ordered. Patient and patient's daughter request to change the old Fentanly patch to right shoulder and order for new one because patient pain level 9./10 to right shoulder.  Patient NPO. Deatra Ina, PA notified. New fentanyl patch to left shoulder given as per ordered. Patient resting quietly at this time with CPAP machine in place and functioning.  Will continue to monitor.

## 2012-10-25 NOTE — Plan of Care (Signed)
Problem: RH BOWEL ELIMINATION Goal: RH STG MANAGE BOWEL WITH ASSISTANCE STG Manage Bowel with min Assistance.  Outcome: Not Progressing SMOG enema given today

## 2012-10-25 NOTE — Progress Notes (Signed)
Occupational Therapy Session Note  Patient Details  Name: [name] MRN: [id number] Date of Birth: 1922/11/18  Today's Date: 10/25/2012 Time: 1478-2956 Time Calculation (min): 55 min  Short Term Goals: Week 1:  OT Short Term Goal 1 (Week 1): Pt will perform LB bathing with supervision using AE. OT Short Term Goal 2 (Week 1): Pt will perform LB dressing with min assist using AE sit to stand. OT Short Term Goal 3 (Week 1): Pt will perform toilet transfer with min guard assist using 4 wheeled walker OT Short Term Goal 4 (Week 1): Pt will manage clothing during toileting tasks with min assist. OT Short Term Goal 5 (Week 1): Pt will donn pulllover or button up shirt with min assist.  Skilled Therapeutic Interventions/Progress Updates:    Pt performed sponge bath sit to stand at the bedside chair.  Placed the wash pan on the rollator seat as pt cannot reach it at the bedside table.  Pt needing min guard assist for sit to stand x 3 during peri washing and pulling pants over hips.  She continues to need mod assist to donn pants over her feet and pull over her hips.  She was able to utilize the reacher for donning her brief over her feet but needed mod assist to pull over her hips.  Therapist assisted with donning socks and shoes but she was able to remove both of them with use of the reacher.  Pt confused this am as well kept having difficulty determining her IV and her O2 line.  Therapist had to show her 3 times which was her IV and which was her oxygen to convince her that they were OK.  Pt still very pleasant and working hard to get better.    Therapy Documentation Precautions:  Precautions Precautions: Fall Precaution Comments: moderate arthritis in bilateral shoulders Restrictions Weight Bearing Restrictions: No  Pain: Pain Assessment Pain Assessment: Faces Pain Score: 0-No pain Faces Pain Scale: Hurts a little bit Pain Type: Chronic pain Pain Location: Shoulder Pain Orientation:  Right Patients Stated Pain Goal: 0 Pain Intervention(s): Medication (See eMAR);Repositioned Multiple Pain Sites: No ADL: See FIM for current functional status  Therapy/Group: Individual Therapy  Lysette Lindenbaum OTR/L 10/25/2012, 11:23 AM

## 2012-10-25 NOTE — Progress Notes (Signed)
Order received for SMOG enema to be given, pt held for 30 minutes then had 4 small grape size balls of brown stool and mucus expelled, pt complained of mild discomfort in abdomen after enema. No nausea or vomiting since starting on clear liquids. [name], Adalin Vanderploeg Elon Jester

## 2012-10-25 NOTE — Progress Notes (Signed)
Physical Therapy Note  Patient Details  Name: [name] MRN: [id number] Date of Birth: [birth year] Today's Date: 10/25/2012  Pt missed 30 mins of PT this afternoon due to getting chest xray and upon pts return, states that she "would like to get some rest."  Will check on pt in am.     Vista Deck 10/25/2012, 5:16 PM

## 2012-10-26 ENCOUNTER — Inpatient Hospital Stay (HOSPITAL_COMMUNITY): Admitting: Rehabilitation

## 2012-10-26 ENCOUNTER — Inpatient Hospital Stay (HOSPITAL_COMMUNITY): Payer: Medicare Other | Admitting: Rehabilitation

## 2012-10-26 ENCOUNTER — Inpatient Hospital Stay (HOSPITAL_COMMUNITY): Admitting: Speech Pathology

## 2012-10-26 ENCOUNTER — Inpatient Hospital Stay (HOSPITAL_COMMUNITY)

## 2012-10-26 ENCOUNTER — Inpatient Hospital Stay (HOSPITAL_COMMUNITY): Admitting: Occupational Therapy

## 2012-10-26 DIAGNOSIS — C785 Secondary malignant neoplasm of large intestine and rectum: Secondary | ICD-10-CM

## 2012-10-26 DIAGNOSIS — Z515 Encounter for palliative care: Secondary | ICD-10-CM

## 2012-10-26 DIAGNOSIS — K5900 Constipation, unspecified: Secondary | ICD-10-CM

## 2012-10-26 DIAGNOSIS — I6340 Cerebral infarction due to embolism of unspecified cerebral artery: Secondary | ICD-10-CM

## 2012-10-26 DIAGNOSIS — K56609 Unspecified intestinal obstruction, unspecified as to partial versus complete obstruction: Secondary | ICD-10-CM

## 2012-10-26 MED ORDER — DIBUCAINE 1 % RE OINT
1.0000 "application " | TOPICAL_OINTMENT | RECTAL | Status: DC | PRN
  Filled 2012-10-26: qty 28

## 2012-10-26 MED ORDER — SORBITOL 70 % SOLN
960.0000 mL | TOPICAL_OIL | Freq: Once | ORAL | Status: AC
  Administered 2012-10-26: 960 mL via RECTAL
  Filled 2012-10-26 (×2): qty 240

## 2012-10-26 MED ORDER — BOOST / RESOURCE BREEZE PO LIQD: 1.0000 | Freq: Two times a day (BID) | ORAL | Status: DC

## 2012-10-26 MED ORDER — MAGNESIUM CITRATE PO SOLN
1.0000 | Freq: Once | ORAL | Status: AC
  Administered 2012-10-26: 1 via ORAL
  Filled 2012-10-26: qty 296

## 2012-10-26 MED ORDER — DOCUSATE SODIUM 100 MG PO CAPS
100.0000 mg | ORAL_CAPSULE | Freq: Two times a day (BID) | ORAL | Status: DC
  Administered 2012-10-26: 100 mg via ORAL
  Filled 2012-10-26 (×4): qty 1

## 2012-10-26 MED ORDER — METOCLOPRAMIDE HCL 5 MG PO TABS
5.0000 mg | ORAL_TABLET | Freq: Three times a day (TID) | ORAL | Status: DC
  Filled 2012-10-26 (×2): qty 1

## 2012-10-26 MED ORDER — METHYLNALTREXONE BROMIDE 12 MG/0.6ML ~~LOC~~ SOLN
12.0000 mg | Freq: Once | SUBCUTANEOUS | Status: AC
  Administered 2012-10-26: 12 mg via SUBCUTANEOUS
  Filled 2012-10-26: qty 0.6

## 2012-10-26 NOTE — Progress Notes (Signed)
Patient ordered SMOG enema today along with bottle of magnesium citrate. Patient with liquids by teaspoon. Patient able to retain enema for 30 minutes the results of 2 medium sized stools, light brown in color soft stool, mucus, pt tolerated fair, complained of being sore.  [name], [name]

## 2012-10-26 NOTE — Progress Notes (Signed)
Physical Therapy Session Note  Patient Details  Name: MARCEL SORTER MRN: [id number] Date of Birth: [birth year]  Today's Date: 10/26/2012 Time: 1106-1200 Time Calculation (min): 54 min  Short Term Goals: Week 2:  PT Short Term Goal 1 (Week 2): STGs=LTG's  Skilled Therapeutic Interventions/Progress Updates:   Pt received in recliner with RN and daughter present.  Agreeable to attempt therapy however she wanted to take 10 sips of liquid prior to session.  Provided supervision for safety with swallowing.  Performed gait training (15' x 1, 18'x 1 and 8' x 1) with use of RW, at min/guard to close supervision and on 3LO2 via nasal cannula.  Provided cues for upright posture, increased stride length, and continued pursed lip breathing.  Pt requires increased rest breaks due to fatigue with HR increasing to 122 and O2 sats dropping to 88%.  Able to increase sats to 96% with rest and HR down to 110.  RN made aware.  Assisted pt back to bed per request with mod assist for sit to supine for assisting BLEs into bed and also to scoot closer to EOB.  Allowed pt another sip of liquid and notified RN that she wanted to finish the rest.  Left in bed with visitor present and also went to discuss D/C plans with daughter.  Discussed that pt feels she does wish to return home, however feel that pt is concerned about being a "burden."  Daughter also states she has concerns about her being home with husband, as he tends to "yell" at her.  Spoke about following up with CSW later today.    Therapy Documentation Precautions:  Precautions Precautions: Fall Precaution Comments: moderate arthritis in bilateral shoulders Restrictions Weight Bearing Restrictions: No   Pain: Pain Assessment Pain Assessment: Faces Pain Score: 0-No pain Faces Pain Scale: Hurts little more Pain Type: Chronic pain Pain Location: Shoulder Pain Orientation: Right Patients Stated Pain Goal: 0 Pain Intervention(s): Repositioned Multiple  Pain Sites: No   Locomotion : Ambulation Ambulation/Gait Assistance: 4: Min guard   See FIM for current functional status  Therapy/Group: Individual Therapy  Vista Deck 10/26/2012, 12:25 PM

## 2012-10-26 NOTE — Progress Notes (Signed)
Patient vomited a huge amount of dark greenish liquid with some food particles after giving PO medication. Patient stated she ate a lot . After vomiting patient stated she feels much better. Patient's daughter notified.

## 2012-10-26 NOTE — Plan of Care (Signed)
Problem: RH BOWEL ELIMINATION Goal: RH STG MANAGE BOWEL W/MEDICATION W/ASSISTANCE STG Manage Bowel with Medication with min Assistance.  Outcome: Not Progressing Magnesium citrate today given by 1/2 teaspoon sips by staff due to fatigue and SMOG enema total management by staff

## 2012-10-26 NOTE — Progress Notes (Signed)
Thank you for consulting the Palliative Medicine Team at [facility] to meet your patient's and family's needs.   The reason that you asked [place] to see your patient is EOL issues, symptom mangement recommendations, discussion of home options  We have scheduled your patient for a meeting: today Thursday 10/26/12 @ 5:00 pm  The Surrogate decision maker is: patient is making own decisions with assistance from daughter Red Christians [phone]   Other family members that need to be present: Daughter Consuella Lose coming into town this afternoon will be present as well as possibly dtr  Okey Regal via phone  Your patient is able to participate:  Additional Narrative: spoke with daughter and patient at bedside both indicate they would like to talk about options and be sure they had appropriate meds, etc in place prior to discharge - daughter voiced they previously were followed [facility] prior to transitioning to rehab     Valente David, RN 10/26/2012, 12:09 PM Palliative Medicine Team RN Liaison [phone]

## 2012-10-26 NOTE — Consult Note (Signed)
Palliative Medicine Team at [facility]  Date: 11-23-12   Patient Name: [name]  DOB: 12-02-1922  MRN: [id number]  Age / Sex: 79 y.o., female   PCP: Marden Noble, MD Referring Physician: Erick Colace, MD  HPI/Reason for Consultation: [name] is a 79 yo woman with advanced metastatic colon cancer diagnosed in 2012, followed by Dr. Truett Perna who exhausted her chemotherapy treatment options and has had disease progression including diffuse peritoneal mets and an enlarging right pelvic tumor. She requires weekly paracentesis and had been living at home with the assistance of Hospice until her admission on 8/29 for embolic strokes. She has been on the rehab unit for stroke recovery with the ultimate goal of being able to transition back home and maintain her independence for as long as possible. While she has done mostly very well with her stroke recovery she has developed a refractory P-SBO while on the rehab unit wihich is felt strongly to be directly associated with her cancer progression. She is nearing maximal improvement in terms of rehab and disposition options are being explored-specifically resuming Hospice services-especially given her decline with P-SBO. PMT consulted for goals of care and care coordination.   Participants in Discussion: 79 daughter and her SIL, patient her self with full capacity  Goals/Summary of Discussion:  1. Code Status:  DNR  2. Scope of Treatment:  "Treat the treatable within reason" if it provides comfort and length of life without subjecting her to painful interventions. Patient is very clear that she would not want surgical interventions , any invasive diagnostic testing, or otherwise aggressive teatments. Family much more conflicted than patient on desire for medical intervention and diagnostics for potentially treatable conditions- major issue in the room is trying to figure out if her SBO is specifically a malignant obstruction or is it  related to severe constipation, immobility, ascites-or even all the above. Patient very worried about dying of a malignant obstruction-I specifically addressed that issue and provided reassurance that if she developed a persistent obstruction that Hospice would assist her with comfortable and peaceful passing-at which time she expressed gratitude. For now obstruction seems to have resolved-she has been able to tolerate liquids.  3. Assessment/Plan: 79 yo with metastatic colon cancer, peritoneal carcinomatosis and recent MCA embolic strokes-multifactorial with cancer and a.fib. She has recovered well from the stroke and has regained a significant amount of mobility, but she extremely deconditioned and weak in general. She is currently nearing the end of her CIR stay with an anticipated discharge date of  9/13.  Unfortunately she has developed what radiographically and symptomatically appears to be a partial SBO-much improved over the past 48 hours-she was dehydrated from being NPO with a drop in her serum sodium to 128, severe fatigue, and serious issues with constipation.  1. MCA embolic stroke, on full dose ASA, not a candidate for anticoagulation. The combination of A. Fib, immobility, prior stroke and risk for hypercoagulability associated with cancer make her a very high risk for recurrent events and further reduce her mortality estimates. She has reached maximal improvement in her stroke rehab and has been given tools for safety when she transitions home. Currently she is ambulatory with a walker with assistance. She has full mental capacity.  2. Severe constipation, has been a life long problem but recently much worse in setting of opiates for pain control. Per daughter they had a good regimen at home and "perfected" the regimen with the assistance of Hospice-however with her acute hospitalization she  has developed a significant stool burden. Unfortunately she has had multiple SMOG enemas, oral meds and  attempts at manual disimpaction with removal of only small amounts of very hard stool (she also had barium contrast). Very high concern that this is primary reason for bowel obstruction or at least a major contributor, to an already narrow and compressed bowel.  Given refractory constipation, will use Relistor SQ daily in addition to oral osmotics and enemas.  Nupercaine for rectal pain, she reports being extremely sore from enemas and manual disimpaction  Warm water enemas with Mineral oil as tolerated  If impaction persists may need to consider getting GI involved.   3. Colon Cancer, abdominal pain from tumor growth and carcinamatosis- advanced stage, no active treatment options, comfort care- tumor progression on CT. Prognosis <6 months-much less if she has malignant obstruction.  She is currently getting weekly paracentesis for symptoms/malignant ascites- ?aspira drain- I did not discuss this with her because she was getting very tired towards the end of our discussion-but this may be a reasonable juncture to consider this-I anticipate her mobility will decline and being transported to IR weekly will become much more difficult-will discuss in AM.  She is tolerating reduction in her Fentanyl patch to 12.2mcg-pain is sharp mostly in her pelvis  Reports dyspnea, especially when her ascites is large volume due to restriction of diaphragm.  4. Partial, SBO, multifactorial, form now seems to be much improved, she is tolerating full liquids.  Aggressively treat constipation  Will need to treat this in part as a potential malignant obstruction-famotidine IV, PPI IV to reduce stomach secretions and since appears partial will use Reglan-PO for now, but if vomiting occurs should change to scheduled IV dosing.  I think she would benefit from some low dose decadron to reduce bowel edema, help with NV, and stimulate her energy level-will need to discuss this with patient and family so the understand  risk benefit-steroids may make her more prone to clotting/emboli, but may improve at least her short term QOL.  5. Insomnia, evening anxiety: associated with multiple interruptions in sleep for vital signs CBG checks, CPAP issues etc.. Patient reports profound exhaustion. She does not want medication for now to help with sleep, but thinks in a calmer less intrusive environment she would be able to sleep well.  Nursing orders placed for minimal PM interruptions.   Disposition: Plan is for home with Hospice when stable for discharge, if her symptoms of SBO persist she may need to go to [facility] for stabilization and acute symptom management prior to discharge home. [facility] provider to eval and review tomorrow.   Time: 70 minutes Greater than 50%  of this time was spent counseling and coordinating care related to the above assessment and plan.  Signed by: Edsel Petrin, DO  2012/11/05, 12:11 AM  Please contact Palliative Medicine Team phone at [phone] for questions and concerns.

## 2012-10-26 NOTE — Progress Notes (Signed)
Physical Therapy Note  Patient Details  Name: [name] MRN: [id number] Date of Birth: 20-Sep-1922 Today's Date: 10/26/2012  Pt missed 30 mins of PT this afternoon due to fatigue, wanting to finish drinking liquid solution, and resting in preparation for meeting later today with family and Child psychotherapist.     Vista Deck 10/26/2012, 1:19 PM

## 2012-10-26 NOTE — Progress Notes (Signed)
Patient c/o of CPAP machine. Respiratory therapist notified and immediately arrived in the patient's room.Patient unable to tolerate oxygen 2L via nasal cannula and CPAP together. O2 sat between 90-93 %. Patient's daughter made aware and agreed to continue using oxygen 2L via nasal cannula  and  remove CPAP machine tonight. Will continue to monitor.

## 2012-10-26 NOTE — Progress Notes (Signed)
Social Work Patient ID: [name], female   DOB: 12-03-1922, 79 y.o.   MRN: [id number] Spoke with MD who reports tentative discharge date of Sat.  He also spoke with daughter about this.  Palliative care becoming involved and will meet with family. Pt's other daughter coming today to assist and provide emotional support to her sister and pt.  Will work toward discharge.

## 2012-10-26 NOTE — Progress Notes (Signed)
Patient ID: [name], female   DOB: [birth year], 79 y.o.   MRN: [id number] 79 y.o. right-handed female with history of metastatic adenocarcinoma diagnosed 2012.previous chemotherapy treatments but stopped due to progression of her cancer, malignant ascites, diabetes mellitus peripheral neuropathy. Patient independent with rolling walker prior to admission. Admitted 10/13/2012 after a fall with noted slurred speech and left-sided weakness. MRI of the brain showed scattered small acute posterior right MCA territory infarcts. Several tiny acute infarcts also in the posterior left MCA territory. MRA of the head negative. Echocardiogram with ejection fraction of 55% normal systolic function. Carotid Dopplers with no ICA stenosis. Patient did not receive TPA. Neurology services consulted placed on aspirin therapy for CVA prophylaxis. Speech therapy for dysphagia currently maintained on a dysphagia 2 thin liquid diet. Noted chronic anemia hemoglobin 6.8 patient has been transfused with latest hemoglobin 10.1. Patient is being followed by hospice care for history of progressive metastatic adenocarcinoma. She does undergo weekly therapeutic paracentesis for chronic ascites and followed by oncology services Dr. Arnoldo Lenis.   Subjective/Complaints: Small results from enema.  Pain ok on reduced fentanyl patch, not taking MSO4 or other po meds Started clear liquids yesterday without vomiting Case discussed with Dr Truett Perna as well as pt and daughter today Gen Surgery on consult, appreciate notes  Discussed 9/13 D/C with pt , daughter adn Dr Truett Perna.  Would like Gen Surg input  A 12 point review of systems has been performed and if not noted above is otherwise negative. . Objective: Vital Signs: Blood pressure 122/76, pulse 94, temperature 97.9 F (36.6 C), temperature source Oral, resp. rate 17, height 5\' 1"  (1.549 m), weight 74.5 kg (164 lb 3.9 oz), SpO2 97.00%. Dg Chest 2 View  10/25/2012   CLINICAL  DATA:  79 year old female shortness of Breath. Metastatic carcinoma.  EXAM: CHEST  2 VIEW  COMPARISON:  Abdomen CT 10/23/2012 and earlier.  FINDINGS: Portable AP semi upright view at at 1534 hrs. Mildly lower lung volumes. Stable cardiac size and mediastinal contours. No pneumothorax or pulmonary edema. No large pleural effusion. No confluent pulmonary opacity.  IMPRESSION: Lower lung volumes, otherwise no acute cardiopulmonary abnormality.   Electronically Signed   By: Augusto Gamble M.D.   On: 10/25/2012 16:50   Dg Abd 2 Views  10/25/2012   *RADIOLOGY REPORT*  Clinical Data: Follow-up small bowel obstruction.  Abdominal pain.  ABDOMEN - 2 VIEW  Comparison: CT, 10/23/2012  Findings: Small bowel dilation has decreased when compared to the prior CT, although there is still small bowel dilation and air- fluid levels.  This suggests an improved, but not resolved, partial small bowel obstruction.  There is no free air  Contrast mixed with stool is seen throughout a nondistended colon.  IMPRESSION: Improved, but unresolved, partial small bowel obstruction.   Original Report Authenticated By: Amie Portland, M.D.   Results for orders placed during the hospital encounter of 10/18/12 (from the past 72 hour(s))  CBC     Status: Abnormal   Collection Time    10/23/12  3:52 PM      Result Value Range   WBC 11.1 (*) 4.0 - 10.5 K/uL   RBC 4.49  3.87 - 5.11 MIL/uL   Hemoglobin 10.4 (*) 12.0 - 15.0 g/dL   HCT 14.7 (*) 82.9 - 56.2 %   MCV 78.6  78.0 - 100.0 fL   MCH 23.2 (*) 26.0 - 34.0 pg   MCHC 29.5 (*) 30.0 - 36.0 g/dL   RDW 13.0 (*) 86.5 -  15.5 %   Platelets 344  150 - 400 K/uL  BASIC METABOLIC PANEL     Status: Abnormal   Collection Time    10/23/12  3:52 PM      Result Value Range   Sodium 133 (*) 135 - 145 mEq/L   Potassium 5.2 (*) 3.5 - 5.1 mEq/L   Chloride 91 (*) 96 - 112 mEq/L   CO2 33 (*) 19 - 32 mEq/L   Glucose, Bld 181 (*) 70 - 99 mg/dL   BUN 24 (*) 6 - 23 mg/dL   Creatinine, Ser 2.13 (*) 0.50 -  1.10 mg/dL   Calcium 9.2  8.4 - 08.6 mg/dL   GFR calc non Af Amer 43 (*) >90 mL/min   GFR calc Af Amer 50 (*) >90 mL/min   Comment: (NOTE)     The eGFR has been calculated using the CKD EPI equation.     This calculation has not been validated in all clinical situations.     eGFR's persistently <90 mL/min signify possible Chronic Kidney     Disease.  BASIC METABOLIC PANEL     Status: Abnormal   Collection Time    10/25/12 10:55 AM      Result Value Range   Sodium 128 (*) 135 - 145 mEq/L   Potassium 4.8  3.5 - 5.1 mEq/L   Chloride 90 (*) 96 - 112 mEq/L   CO2 26  19 - 32 mEq/L   Glucose, Bld 142 (*) 70 - 99 mg/dL   BUN 17  6 - 23 mg/dL   Creatinine, Ser 5.78  0.50 - 1.10 mg/dL   Calcium 8.9  8.4 - 46.9 mg/dL   GFR calc non Af Amer 73 (*) >90 mL/min   GFR calc Af Amer 84 (*) >90 mL/min   Comment: (NOTE)     The eGFR has been calculated using the CKD EPI equation.     This calculation has not been validated in all clinical situations.     eGFR's persistently <90 mL/min signify possible Chronic Kidney     Disease.     HEENT: normal and no drainage Cardio: RRR and no murmurs Resp: CTA B/L and unlabored GI: BS infrequent but present,  mild distension persistent. Non-tender in Lower quadrants, tenderness and fullness in epigastric area Extremity:  Pulses positive and No Edema Skin:   Intact Neuro: Alert/Oriented, Flat, Normal Sensory and Normal Motor Musc/Skel:  Normal Gen NAD  Repeat KUB, looks like barium is moving through bowel.  Still dilated sm bowel loops.  Assessment/Plan: 1. Functional deficits secondary to Post R MCA infarct which require 3+ hours per day of interdisciplinary therapy in a comprehensive inpatient rehab setting. Physiatrist is providing close team supervision and 24 hour management of active medical problems listed below. Physiatrist and rehab team continue to assess barriers to discharge/monitor patient progress toward functional and medical  goals.  less nauseated. No vomiting currently.  PT/OT as pt tolerates, activity should help with bowels Reduced fentanyl patch without increased pain Still doing at least 2hours of therapy per day  Tent D/C 9/13, get hospice re involved Gen Surgery to advance diet if appropriate FIM: FIM - Bathing Bathing Steps Patient Completed: Chest;Right Arm;Left Arm;Abdomen;Right upper leg;Left upper leg;Right lower leg (including foot);Left lower leg (including foot) Bathing: 4: Min-Patient completes 8-9 67f 10 parts or 75+ percent  FIM - Upper Body Dressing/Undressing Upper body dressing/undressing steps patient completed: Thread/unthread right bra strap;Hook/unhook bra;Thread/unthread right sleeve of front closure shirt/dress Upper body  dressing/undressing: 2: Max-Patient completed 25-49% of tasks FIM - Lower Body Dressing/Undressing Lower body dressing/undressing steps patient completed: Thread/unthread right underwear leg;Thread/unthread left underwear leg;Thread/unthread left pants leg;Fasten/unfasten right shoe;Fasten/unfasten left shoe;Don/Doff right shoe;Don/Doff left shoe Lower body dressing/undressing: 3: Mod-Patient completed 50-74% of tasks  FIM - Toileting Toileting steps completed by patient: Adjust clothing prior to toileting;Performs perineal hygiene Toileting Assistive Devices:  (walker) Toileting: 0: Activity did not occur  FIM - Diplomatic Services operational officer Devices: Environmental consultant;Bedside commode Toilet Transfers: 4-To toilet/BSC: Min A (steadying Pt. > 75%);4-From toilet/BSC: Min A (steadying Pt. > 75%)  FIM - Bed/Chair Transfer Bed/Chair Transfer Assistive Devices: Walker;HOB elevated;Bed rails Bed/Chair Transfer: 3: Supine > Sit: Mod A (lifting assist/Pt. 50-74%/lift 2 legs  FIM - Locomotion: Wheelchair Distance: 10' Locomotion: Wheelchair: 0: Activity did not occur FIM - Locomotion: Ambulation Locomotion: Ambulation Assistive Devices: Dealer Ambulation/Gait Assistance: 4: Min assist Locomotion: Ambulation: 1: Travels less than 50 ft with minimal assistance (Pt.>75%)  Comprehension Comprehension Mode: Auditory Comprehension: 3-Understands basic 50 - 74% of the time/requires cueing 25 - 50%  of the time  Expression Expression Mode: Verbal Expression: 4-Expresses basic 75 - 89% of the time/requires cueing 10 - 24% of the time. Needs helper to occlude trach/needs to repeat words.  Social Interaction Social Interaction: 3-Interacts appropriately 50 - 74% of the time - May be physically or verbally inappropriate.  Problem Solving Problem Solving: 3-Solves basic 50 - 74% of the time/requires cueing 25 - 49% of the time  Memory Memory: 2-Recognizes or recalls 25 - 49% of the time/requires cueing 51 - 75% of the time   Medical Problem List and Plan:  1. Embolic bi cerebral infarcts  2. DVT Prophylaxis/Anticoagulation: SCDs. Monitor for any signs of DVT  3. Pain Management: Duragesic patch 12.5 mcg change every 72 hours, , Lidoderm patch, Ultram as needed. Has prn MSO4 as well Monitor with increased activity  4. Mood: Wellbutrin 100 mg daily, Effexor 75 mg daily. Provide emotional support  5. Neuropsych: This patient is capable of making decisions on her own behalf.  6. Metastatic adenocarcinoma of colon with malignant ascites. Followup oncology services.  Patient receives weekly paracentesis for malignant ascites per interventional radiology due tomorrow -7. Dysphagia. Dysphagia 2 nectar liquids. Followup speech therapy. Monitor for any aspiration  8. Chronic anemia. Patient has been transfused.following clinically --9.9 9. Hypertension. Coreg 3.125 mg twice a day. Monitor with increased activity  10. Obstructive sleep apnea. Continue CPAP  11. Diabetes mellitus with peripheral neuropathy. She is presently off her Glucophage 500 mg twice a day as prior to admission. Hemoglobin A1c 6.2. Check blood sugars a.c. and at  bedtime  12. Constipation and likely associated nause/vomiting- partial SBO related to carcinomatosis as well as chronic obstipation, ,surgery to f/u with recs    -prn rx of nausea IV Zofran until not vomiting    LOS (Days) 8 A FACE TO FACE EVALUATION WAS PERFORMED  KIRSTEINS,ANDREW E 10/26/2012, 8:44 AM

## 2012-10-26 NOTE — Progress Notes (Signed)
I have seen and examined the patient and agree with the assessment and plans. Improving slowly.  Xray is better.  Will try mag citrate too  Lalena Salas A. Magnus Ivan  MD, FACS

## 2012-10-26 NOTE — Progress Notes (Addendum)
Subjective: Pt had a rough night last night due to problems with CPAP & [place]. Tolerated sips of clears yesterday, would like to try more. No nausea or vomiting. She did have several small BM's yesterday after the SMOG enema.  Objective: Vital signs in last 24 hours: Temp:  [97.9 F (36.6 C)-98.1 F (36.7 C)] 97.9 F (36.6 C) (09/11 0508) Pulse Rate:  [94-99] 94 (09/11 0508) Resp:  [17-19] 17 (09/11 0508) BP: (122-130)/(67-80) 122/76 mmHg (09/11 0508) SpO2:  [97 %] 97 % (09/11 0508) Weight:  [164 lb 3.9 oz (74.5 kg)] 164 lb 3.9 oz (74.5 kg) (09/10 1917) Last BM Date: 10/25/12  Intake/Output from previous day: 09/10 0701 - 09/11 0700 In: 300 [P.O.:300] Out: 2 [Urine:1; Stool:1] Intake/Output this shift:    PE: Abd: soft. Several masses palpable throughout the abdominal wall. nontender to palpation.  Lab Results:   Recent Labs  10/23/12 1552  WBC 11.1*  HGB 10.4*  HCT 35.3*  PLT 344   BMET  Recent Labs  10/23/12 1552 10/25/12 1055  NA 133* 128*  K 5.2* 4.8  CL 91* 90*  CO2 33* 26  GLUCOSE 181* 142*  BUN 24* 17  CREATININE 1.11* 0.77  CALCIUM 9.2 8.9   PT/INR No results found for this basename: LABPROT, INR,  in the last 72 hours CMP     Component Value Date/Time   NA 128* 10/25/2012 1055   NA 139 05/24/2012 0939   K 4.8 10/25/2012 1055   K 4.2 05/24/2012 0939   CL 90* 10/25/2012 1055   CL 104 05/24/2012 0939   CO2 26 10/25/2012 1055   CO2 25 05/24/2012 0939   GLUCOSE 142* 10/25/2012 1055   GLUCOSE 89 05/24/2012 0939   BUN 17 10/25/2012 1055   BUN 24.0 05/24/2012 0939   CREATININE 0.77 10/25/2012 1055   CREATININE 0.7 05/24/2012 0939   CALCIUM 8.9 10/25/2012 1055   CALCIUM 8.5 05/24/2012 0939   PROT 6.4 10/19/2012 0705   PROT 6.0* 05/24/2012 0939   ALBUMIN 2.2* 10/19/2012 0705   ALBUMIN 2.6* 05/24/2012 0939   AST 14 10/19/2012 0705   AST 17 05/24/2012 0939   ALT 10 10/19/2012 0705   ALT 12 05/24/2012 0939   ALKPHOS 55 10/19/2012 0705   ALKPHOS 115 05/24/2012 0939   BILITOT 0.1*  10/19/2012 0705   BILITOT 0.39 05/24/2012 0939   GFRNONAA 73* 10/25/2012 1055   GFRAA 84* 10/25/2012 1055   Lipase  No results found for this basename: lipase       Studies/Results: Dg Chest 2 View  10/25/2012   CLINICAL DATA:  79 year old female shortness of Breath. Metastatic carcinoma.  EXAM: CHEST  2 VIEW  COMPARISON:  Abdomen CT 10/23/2012 and earlier.  FINDINGS: Portable AP semi upright view at at 1534 hrs. Mildly lower lung volumes. Stable cardiac size and mediastinal contours. No pneumothorax or pulmonary edema. No large pleural effusion. No confluent pulmonary opacity.  IMPRESSION: Lower lung volumes, otherwise no acute cardiopulmonary abnormality.   Electronically Signed   By: Augusto Gamble M.D.   On: 10/25/2012 16:50   Dg Abd 2 Views  10/25/2012   *RADIOLOGY REPORT*  Clinical Data: Follow-up small bowel obstruction.  Abdominal pain.  ABDOMEN - 2 VIEW  Comparison: CT, 10/23/2012  Findings: Small bowel dilation has decreased when compared to the prior CT, although there is still small bowel dilation and air- fluid levels.  This suggests an improved, but not resolved, partial small bowel obstruction.  There is no free air  Contrast mixed with stool is seen throughout a nondistended colon.  IMPRESSION: Improved, but unresolved, partial small bowel obstruction.   Original Report Authenticated By: Amie Portland, M.D.    Anti-infectives: Anti-infectives   None       Assessment/Plan  A:  1. PSBO - improved clinically, ? secondary to malignancy or adhesive disease 2. 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 CVA 4. Constipation  P: As PSBO still seems to improve, will advance to full liquids today as tolerated. Will give another SMOG enema to continue help with constipation.    LOS: 8 days   Levert Feinstein 10/26/2012, 8:30 AM PA Pager: [phone]

## 2012-10-26 NOTE — Progress Notes (Signed)
Occupational Therapy Session Note  Patient Details  Name: [name] MRN: [id number] Date of Birth: Aug 08, 1922  Today's Date: 10/26/2012 Time: 0901-1002 Time Calculation (min): 61 min  Short Term Goals: Week 1:  OT Short Term Goal 1 (Week 1): Pt will perform LB bathing with supervision using AE. OT Short Term Goal 2 (Week 1): Pt will perform LB dressing with min assist using AE sit to stand. OT Short Term Goal 3 (Week 1): Pt will perform toilet transfer with min guard assist using 4 wheeled walker OT Short Term Goal 4 (Week 1): Pt will manage clothing during toileting tasks with min assist. OT Short Term Goal 5 (Week 1): Pt will donn pulllover or button up shirt with min assist.  Skilled Therapeutic Interventions/Progress Updates:    Pt worked on bathing sitting in the bedside recliner.  Therapist positioned her water and soap on the seat of her 4 wheeled walker so pt could reach them easier without increasing pain in her shoulders.  Pt able to perform all bathing, including her feet with min assist.  She utilized a long handle sponge and reacher to wash and dry her feet.  She also utilized the reacher with supervision to doff her gripper socks prior to washing her feet and to help thread her new undergarment over her LEs.  She could perform sit to stand on 3/4 occasions with supervision and then min assist on the last.  [name] continues to need max assist to pull her pants and undergarment protector over her hips.  Oxygen sats were 93-95% during session with HR increasing to 115 BPM.  Increased rest breaks provided throughout session secondary to pt with dyspnea of 3/4 at times after standing.  Therapy Documentation Precautions:  Precautions Precautions: Fall Precaution Comments: moderate arthritis in bilateral shoulders Restrictions Weight Bearing Restrictions: No  Pain: Pain Assessment Pain Assessment: Faces Pain Score: 0-No pain Faces Pain Scale: Hurts little more Pain  Type: Chronic pain Pain Location: Shoulder Pain Orientation: Right Patients Stated Pain Goal: 0 Pain Intervention(s): Repositioned Multiple Pain Sites: No ADL: Other Treatments:    See FIM for current functional status  Therapy/Group: Individual Therapy  Lilliane Sposito OTR/L 10/26/2012, 12:18 PM

## 2012-10-26 NOTE — Progress Notes (Signed)
Speech Language Pathology Daily Session Note  Patient Details  Name: [name] MRN: [id number] Date of Birth: 06/07/1922  Today's Date: 10/26/2012 Time: 1330-1415 Time Calculation (min): 45 min  Short Term Goals: Week 1: SLP Short Term Goal 1 (Week 1): Patient will utilize word finding strategies with Mod I  SLP Short Term Goal 2 (Week 1): Patient will consume recommended diet with minimal overt s/s of aspiration with Supervision verbal cues.  Skilled Therapeutic Interventions: Skilled treatment session focused on addressing dysphagia goals.  SLP facilitated session with proper positioning in bed and fed patient teaspoon sips of laxative medicine provided by RN.  Patient demonstrated coughing episodes x2 due rate of intake.  SLP provided education regarding rationale of taking cup sips the same size as teaspoon sips and was able to carry this over with Min verbal cues.  Patient required no cues during session to utilize head neutral positioning and 2 swallows.  Continue with current plan of care.     FIM:  Comprehension Comprehension Mode: Auditory Comprehension: 3-Understands basic 50 - 74% of the time/requires cueing 25 - 50%  of the time Expression Expression Mode: Verbal Expression: 4-Expresses basic 75 - 89% of the time/requires cueing 10 - 24% of the time. Needs helper to occlude trach/needs to repeat words. Social Interaction Social Interaction: 3-Interacts appropriately 50 - 74% of the time - May be physically or verbally inappropriate. Problem Solving Problem Solving: 3-Solves basic 50 - 74% of the time/requires cueing 25 - 49% of the time Memory Memory: 2-Recognizes or recalls 25 - 49% of the time/requires cueing 51 - 75% of the time FIM - Eating Eating Activity: 5: Supervision/cues  Pain Pain Assessment Pain Assessment: No/denies pain  Therapy/Group: Individual Therapy  Charlane Ferretti., CCC-SLP [phone]  [name] 10/26/2012, 2:44 PM

## 2012-10-26 NOTE — Plan of Care (Signed)
Problem: RH BOWEL ELIMINATION Goal: RH STG MANAGE BOWEL WITH ASSISTANCE STG Manage Bowel with min Assistance.  Outcome: Not Progressing Total assist of staff for enema adminstration

## 2012-10-26 NOTE — Progress Notes (Signed)
NUTRITION FOLLOW UP  Intervention:   1.  Supplements; Resource Breeze po BID, each supplement provides 250 kcal and 9 grams of protein.  Nutrition Dx:   Increased nutrient needs, ongoing.  Monitor:   1. Food/Beverage; pt meeting >/=90% estimated needs with tolerance. Ultimate goal of comfort care to be upheld so pt is eating as desired. Somewhat met, pt currently on liquid diet 2. Wt/wt change; monitor trends.  Ongoing.  Assessment:   Pt admitted with deconditioning r/t to stroke. Pt also with metastatic adenocarcinoma of appendix. Pt is followed by hospice and is planning to d/c home with hospice services.  Pt working with therapy at time of visit. RD notes events of the past week r/t PSBO with NPO/liquid diet since (9/7).  Pt has been advanced to full liquids for lunch today.  Requiring several interventions to achieve BM.   Height: Ht Readings from Last 1 Encounters:  10/18/12 5\' 1"  (1.549 m)    Weight Status:   Wt Readings from Last 1 Encounters:  10/25/12 164 lb 3.9 oz (74.5 kg)    Re-estimated needs:  Kcal: 1660-1800 Protein: 62-78g Fluid: ~1.8 L/day  Skin: laceration to back of head  Diet Order: Full Liquid   Intake/Output Summary (Last 24 hours) at 10/26/12 1142 Last data filed at 10/25/12 2100  Gross per 24 hour  Intake    180 ml  Output      2 ml  Net    178 ml    Last BM: 9/10   Labs:   Recent Labs Lab 10/23/12 1552 10/25/12 1055  NA 133* 128*  K 5.2* 4.8  CL 91* 90*  CO2 33* 26  BUN 24* 17  CREATININE 1.11* 0.77  CALCIUM 9.2 8.9  GLUCOSE 181* 142*    CBG (last 3)  No results found for this basename: GLUCAP,  in the last 72 hours  Scheduled Meds: . antiseptic oral rinse  15 mL Mouth Rinse BID  . aspirin  325 mg Oral Daily  . buPROPion  100 mg Oral Daily  . carvedilol  3.125 mg Oral BID WC  . famotidine (PEPCID) IV  20 mg Intravenous Q24H  . fentaNYL  12.5 mcg Transdermal Q72H  . furosemide  40 mg Oral Daily  . lidocaine  1  patch Transdermal Q24H  . polyethylene glycol  17 g Oral Daily  . senna-docusate  2 tablet Oral BID  . sorbitol, milk of mag, mineral oil, glycerin (SMOG) enema  960 mL Rectal Once  . venlafaxine XR  75 mg Oral Q breakfast    Continuous Infusions: . dextrose 5 % and 0.45% NaCl 50 mL/hr at 10/26/12 0981    Loyce Dys, MS RD LDN Clinical Inpatient Dietitian Pager: [phone] Weekend/After hours pager: [phone]

## 2012-10-27 ENCOUNTER — Ambulatory Visit: Payer: Medicare Other | Admitting: Oncology

## 2012-10-27 ENCOUNTER — Inpatient Hospital Stay (HOSPITAL_COMMUNITY)
Admission: AD | Admit: 2012-10-27 | Discharge: 2012-11-15 | DRG: 375 | Disposition: E | Payer: Medicare Other | Source: Ambulatory Visit | Attending: Internal Medicine | Admitting: Internal Medicine

## 2012-10-27 ENCOUNTER — Encounter (HOSPITAL_COMMUNITY): Payer: Self-pay | Admitting: Internal Medicine

## 2012-10-27 ENCOUNTER — Encounter (HOSPITAL_COMMUNITY): Admitting: Occupational Therapy

## 2012-10-27 ENCOUNTER — Encounter (HOSPITAL_COMMUNITY): Admitting: Speech Pathology

## 2012-10-27 ENCOUNTER — Inpatient Hospital Stay (HOSPITAL_COMMUNITY): Payer: Medicare Other

## 2012-10-27 ENCOUNTER — Inpatient Hospital Stay (HOSPITAL_COMMUNITY)

## 2012-10-27 ENCOUNTER — Ambulatory Visit (HOSPITAL_COMMUNITY): Admitting: Rehabilitation

## 2012-10-27 ENCOUNTER — Inpatient Hospital Stay (HOSPITAL_COMMUNITY): Payer: Medicare Other | Admitting: Occupational Therapy

## 2012-10-27 ENCOUNTER — Ambulatory Visit (HOSPITAL_COMMUNITY): Payer: Medicare Other

## 2012-10-27 DIAGNOSIS — K565 Intestinal adhesions [bands], unspecified as to partial versus complete obstruction: Secondary | ICD-10-CM | POA: Diagnosis present

## 2012-10-27 DIAGNOSIS — K5900 Constipation, unspecified: Secondary | ICD-10-CM

## 2012-10-27 DIAGNOSIS — C181 Malignant neoplasm of appendix: Principal | ICD-10-CM | POA: Diagnosis present

## 2012-10-27 DIAGNOSIS — I69991 Dysphagia following unspecified cerebrovascular disease: Secondary | ICD-10-CM

## 2012-10-27 DIAGNOSIS — R0602 Shortness of breath: Secondary | ICD-10-CM | POA: Diagnosis present

## 2012-10-27 DIAGNOSIS — F329 Major depressive disorder, single episode, unspecified: Secondary | ICD-10-CM | POA: Diagnosis present

## 2012-10-27 DIAGNOSIS — E1149 Type 2 diabetes mellitus with other diabetic neurological complication: Secondary | ICD-10-CM | POA: Diagnosis present

## 2012-10-27 DIAGNOSIS — R29898 Other symptoms and signs involving the musculoskeletal system: Secondary | ICD-10-CM | POA: Diagnosis present

## 2012-10-27 DIAGNOSIS — F411 Generalized anxiety disorder: Secondary | ICD-10-CM | POA: Diagnosis present

## 2012-10-27 DIAGNOSIS — R112 Nausea with vomiting, unspecified: Secondary | ICD-10-CM

## 2012-10-27 DIAGNOSIS — K56609 Unspecified intestinal obstruction, unspecified as to partial versus complete obstruction: Secondary | ICD-10-CM

## 2012-10-27 DIAGNOSIS — E669 Obesity, unspecified: Secondary | ICD-10-CM | POA: Diagnosis present

## 2012-10-27 DIAGNOSIS — R180 Malignant ascites: Secondary | ICD-10-CM

## 2012-10-27 DIAGNOSIS — G893 Neoplasm related pain (acute) (chronic): Secondary | ICD-10-CM | POA: Diagnosis present

## 2012-10-27 DIAGNOSIS — Z9221 Personal history of antineoplastic chemotherapy: Secondary | ICD-10-CM

## 2012-10-27 DIAGNOSIS — I6350 Cerebral infarction due to unspecified occlusion or stenosis of unspecified cerebral artery: Secondary | ICD-10-CM

## 2012-10-27 DIAGNOSIS — G4733 Obstructive sleep apnea (adult) (pediatric): Secondary | ICD-10-CM | POA: Diagnosis present

## 2012-10-27 DIAGNOSIS — I69998 Other sequelae following unspecified cerebrovascular disease: Secondary | ICD-10-CM

## 2012-10-27 DIAGNOSIS — E119 Type 2 diabetes mellitus without complications: Secondary | ICD-10-CM

## 2012-10-27 DIAGNOSIS — C799 Secondary malignant neoplasm of unspecified site: Secondary | ICD-10-CM

## 2012-10-27 DIAGNOSIS — Z79899 Other long term (current) drug therapy: Secondary | ICD-10-CM

## 2012-10-27 DIAGNOSIS — C801 Malignant (primary) neoplasm, unspecified: Secondary | ICD-10-CM

## 2012-10-27 DIAGNOSIS — Z515 Encounter for palliative care: Secondary | ICD-10-CM

## 2012-10-27 DIAGNOSIS — F3289 Other specified depressive episodes: Secondary | ICD-10-CM | POA: Diagnosis present

## 2012-10-27 DIAGNOSIS — D649 Anemia, unspecified: Secondary | ICD-10-CM | POA: Diagnosis present

## 2012-10-27 DIAGNOSIS — R1310 Dysphagia, unspecified: Secondary | ICD-10-CM | POA: Diagnosis present

## 2012-10-27 DIAGNOSIS — I10 Essential (primary) hypertension: Secondary | ICD-10-CM | POA: Diagnosis present

## 2012-10-27 DIAGNOSIS — G8929 Other chronic pain: Secondary | ICD-10-CM | POA: Diagnosis present

## 2012-10-27 DIAGNOSIS — E1142 Type 2 diabetes mellitus with diabetic polyneuropathy: Secondary | ICD-10-CM | POA: Diagnosis present

## 2012-10-27 DIAGNOSIS — Z66 Do not resuscitate: Secondary | ICD-10-CM | POA: Diagnosis present

## 2012-10-27 DIAGNOSIS — H269 Unspecified cataract: Secondary | ICD-10-CM | POA: Diagnosis present

## 2012-10-27 DIAGNOSIS — C786 Secondary malignant neoplasm of retroperitoneum and peritoneum: Secondary | ICD-10-CM | POA: Diagnosis present

## 2012-10-27 DIAGNOSIS — I639 Cerebral infarction, unspecified: Secondary | ICD-10-CM | POA: Diagnosis present

## 2012-10-27 DIAGNOSIS — Z6831 Body mass index (BMI) 31.0-31.9, adult: Secondary | ICD-10-CM

## 2012-10-27 MED ORDER — METOCLOPRAMIDE HCL 5 MG/ML IJ SOLN
10.0000 mg | Freq: Four times a day (QID) | INTRAMUSCULAR | Status: DC
  Administered 2012-10-27 (×3): 10 mg via INTRAVENOUS
  Filled 2012-10-27 (×6): qty 2

## 2012-10-27 MED ORDER — FUROSEMIDE 10 MG/ML IJ SOLN
40.0000 mg | Freq: Once | INTRAMUSCULAR | Status: AC
  Administered 2012-10-27: 40 mg via INTRAVENOUS
  Filled 2012-10-27: qty 4

## 2012-10-27 MED ORDER — LORAZEPAM 2 MG/ML IJ SOLN: 0.2500 mg | Freq: Four times a day (QID) | INTRAMUSCULAR | Status: DC | PRN

## 2012-10-27 MED ORDER — FAMOTIDINE IN NACL 20-0.9 MG/50ML-% IV SOLN
20.0000 mg | Freq: Two times a day (BID) | INTRAVENOUS | Status: DC
Start: 2012-10-27 — End: 2012-10-29
  Administered 2012-10-27 – 2012-10-29 (×4): 20 mg via INTRAVENOUS
  Filled 2012-10-27 (×5): qty 50

## 2012-10-27 MED ORDER — LORAZEPAM 2 MG/ML IJ SOLN: 1.0000 mg | INTRAMUSCULAR | Status: DC | PRN

## 2012-10-27 MED ORDER — MORPHINE SULFATE 2 MG/ML IJ SOLN
1.0000 mg | INTRAMUSCULAR | Status: DC | PRN
  Administered 2012-10-28 – 2012-10-29 (×2): 1 mg via INTRAVENOUS
  Filled 2012-10-27 (×3): qty 1

## 2012-10-27 MED ORDER — PANTOPRAZOLE SODIUM 40 MG IV SOLR
40.0000 mg | Freq: Two times a day (BID) | INTRAVENOUS | Status: DC
  Administered 2012-10-27 (×2): 40 mg via INTRAVENOUS
  Filled 2012-10-27 (×4): qty 40

## 2012-10-27 MED ORDER — ONDANSETRON HCL 4 MG/2ML IJ SOLN
4.0000 mg | Freq: Four times a day (QID) | INTRAMUSCULAR | Status: DC | PRN
  Administered 2012-10-28: 4 mg via INTRAVENOUS
  Filled 2012-10-27: qty 2

## 2012-10-27 MED ORDER — PANTOPRAZOLE SODIUM 40 MG IV SOLR
40.0000 mg | Freq: Two times a day (BID) | INTRAVENOUS | Status: DC
  Administered 2012-10-27 – 2012-10-29 (×4): 40 mg via INTRAVENOUS
  Filled 2012-10-27 (×5): qty 40

## 2012-10-27 MED ORDER — PROMETHAZINE HCL 25 MG/ML IJ SOLN: 12.5000 mg | Freq: Four times a day (QID) | INTRAMUSCULAR | Status: DC | PRN

## 2012-10-27 MED ORDER — FENTANYL 25 MCG/HR TD PT72
25.0000 ug | MEDICATED_PATCH | TRANSDERMAL | Status: DC
  Administered 2012-10-27: 25 ug via TRANSDERMAL
  Filled 2012-10-27: qty 1

## 2012-10-27 MED ORDER — LORAZEPAM 2 MG/ML IJ SOLN
0.5000 mg | INTRAMUSCULAR | Status: DC | PRN
  Administered 2012-10-28 – 2012-10-29 (×2): 0.5 mg via INTRAVENOUS
  Filled 2012-10-27 (×2): qty 1

## 2012-10-27 MED ORDER — FENTANYL CITRATE 0.05 MG/ML IJ SOLN: 25.0000 ug | INTRAMUSCULAR | Status: DC | PRN

## 2012-10-27 MED ORDER — METHYLNALTREXONE BROMIDE 12 MG/0.6ML ~~LOC~~ SOLN
12.0000 mg | SUBCUTANEOUS | Status: DC
  Administered 2012-10-28: 12 mg via SUBCUTANEOUS
  Filled 2012-10-27: qty 0.6

## 2012-10-27 MED ORDER — BISACODYL 10 MG RE SUPP: 10.0000 mg | Freq: Every day | RECTAL | Status: DC | PRN

## 2012-10-27 MED ORDER — DEXAMETHASONE SODIUM PHOSPHATE 4 MG/ML IJ SOLN
4.0000 mg | Freq: Once | INTRAMUSCULAR | Status: AC
  Administered 2012-10-27: 4 mg via INTRAVENOUS
  Filled 2012-10-27: qty 1

## 2012-10-27 MED ORDER — ONDANSETRON HCL 4 MG PO TABS: 4.0000 mg | ORAL_TABLET | Freq: Four times a day (QID) | ORAL | Status: DC | PRN

## 2012-10-27 MED ORDER — FLEET ENEMA 7-19 GM/118ML RE ENEM
1.0000 | ENEMA | Freq: Once | RECTAL | Status: AC | PRN
  Filled 2012-10-27: qty 1

## 2012-10-27 MED ORDER — DEXAMETHASONE SODIUM PHOSPHATE 4 MG/ML IJ SOLN
4.0000 mg | Freq: Two times a day (BID) | INTRAMUSCULAR | Status: DC
Start: 2012-10-27 — End: 2012-10-29
  Administered 2012-10-27 – 2012-10-28 (×3): 4 mg via INTRAVENOUS
  Filled 2012-10-27 (×5): qty 1

## 2012-10-27 NOTE — Consult Note (Signed)
[facility] Liaison: Received request from rehab social worker for family interest in [place]. Was made aware patient transferring out of rehab to [facility] today for continued medical care. Also made aware of this patient by Dr. Truett Perna and Dr. Phillips Odor. Attempted to see patient today on 6N, she was having visit with her pastor at that time. Will follow up Monday. Thank you. Forrestine Him LCSW [phone]

## 2012-10-27 NOTE — Progress Notes (Signed)
Subjective: Pt reportedly projectile vomited last night shortly after taking PO meds. She denies abdominal pain this morning. Had a large BM yesterday, which she says actually made her feel worse. She did not like the enema. Still has some residual nausea this AM. Has tolerated one ice chip thus far.  Objective: Vital signs in last 24 hours: Temp:  [97.5 F (36.4 C)-98.5 F (36.9 C)] 97.5 F (36.4 C) (09/12 0518) Pulse Rate:  [105-114] 105 (09/12 0518) Resp:  [20] 20 (09/12 0518) BP: (120-122)/(63-70) 120/63 mmHg (09/12 0518) SpO2:  [90 %-93 %] 90 % (09/12 0518) Last BM Date: 10/26/12  Intake/Output from previous day: 09/11 0701 - 09/12 0700 In: 120 [P.O.:120] Out: 3 [Urine:2; Stool:1] Intake/Output this shift:    PE: Abd: distended, full but nontender to palpation.   Lab Results:  No results found for this basename: WBC, HGB, HCT, PLT,  in the last 72 hours BMET  Recent Labs  10/25/12 1055  NA 128*  K 4.8  CL 90*  CO2 26  GLUCOSE 142*  BUN 17  CREATININE 0.77  CALCIUM 8.9   PT/INR No results found for this basename: LABPROT, INR,  in the last 72 hours CMP     Component Value Date/Time   NA 128* 10/25/2012 1055   NA 139 05/24/2012 0939   K 4.8 10/25/2012 1055   K 4.2 05/24/2012 0939   CL 90* 10/25/2012 1055   CL 104 05/24/2012 0939   CO2 26 10/25/2012 1055   CO2 25 05/24/2012 0939   GLUCOSE 142* 10/25/2012 1055   GLUCOSE 89 05/24/2012 0939   BUN 17 10/25/2012 1055   BUN 24.0 05/24/2012 0939   CREATININE 0.77 10/25/2012 1055   CREATININE 0.7 05/24/2012 0939   CALCIUM 8.9 10/25/2012 1055   CALCIUM 8.5 05/24/2012 0939   PROT 6.4 10/19/2012 0705   PROT 6.0* 05/24/2012 0939   ALBUMIN 2.2* 10/19/2012 0705   ALBUMIN 2.6* 05/24/2012 0939   AST 14 10/19/2012 0705   AST 17 05/24/2012 0939   ALT 10 10/19/2012 0705   ALT 12 05/24/2012 0939   ALKPHOS 55 10/19/2012 0705   ALKPHOS 115 05/24/2012 0939   BILITOT 0.1* 10/19/2012 0705   BILITOT 0.39 05/24/2012 0939   GFRNONAA 73* 10/25/2012 1055   GFRAA  84* 10/25/2012 1055   Lipase  No results found for this basename: lipase       Studies/Results: Dg Chest 2 View  10/25/2012   CLINICAL DATA:  79 year old female shortness of Breath. Metastatic carcinoma.  EXAM: CHEST  2 VIEW  COMPARISON:  Abdomen CT 10/23/2012 and earlier.  FINDINGS: Portable AP semi upright view at at 1534 hrs. Mildly lower lung volumes. Stable cardiac size and mediastinal contours. No pneumothorax or pulmonary edema. No large pleural effusion. No confluent pulmonary opacity.  IMPRESSION: Lower lung volumes, otherwise no acute cardiopulmonary abnormality.   Electronically Signed   By: Augusto Gamble M.D.   On: 10/25/2012 16:50   Dg Abd Portable 1v  10/26/2012   CLINICAL DATA:  Small bowel obstruction  EXAM: PORTABLE ABDOMEN - 1 VIEW  COMPARISON:  Radiograph 10/25/2012, 10/23/2012 CT  FINDINGS: There are mildly dilated loops of small bowel predominantly in the left abdomen measuring up to 3.6 cm which is slightly decreased from 4 cm on prior. There is gas in the rectum. There is interval partial clearing of stool from the colon. .  IMPRESSION: Mild improvement in partial small bowel obstruction.   Electronically Signed   By: Genevive Bi  M.D.   On: 10/26/2012 08:58    Anti-infectives: Anti-infectives   None       Assessment/Plan  A:  1. PSBO - ? secondary to malignancy or adhesive disease - previously was improving, however had setback overnight with projectile vomiting 2. 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 CVA 4. Constipation - ?improved after large BM yesterday  P: Continue ice chips this morning, otherwise NPO. Reassess after paracentesis this morning. Suspect she may feel better after that and we could potentially advance to clears at that time.   LOS: 9 days   Levert Feinstein 10/16/2012, 7:57 AM PA Pager: [phone]

## 2012-10-27 NOTE — Progress Notes (Signed)
IP PROGRESS NOTE  Subjective:  She had an episode of nausea and vomiting last night. She had a bowel movement yesterday. She appears comfortable at present.  Objective: Vital signs in last 24 hours: Blood pressure 120/63, pulse 105, temperature 97.5 F (36.4 C), temperature source Oral, resp. rate 20, height 5\' 1"  (1.549 m), weight 164 lb 3.9 oz (74.5 kg), SpO2 90.00%.  Intake/Output from previous day: 09/11 0701 - 09/12 0700 In: 120 [P.O.:120] Out: 3 [Urine:2; Stool:1]  Physical Exam:  Lungs: Clear anteriorly Cardiac: Regular rate and rhythm Abdomen:  Distended with multiple abdominal masses Extremities: No leg edema Neurologic: Alert and oriented, follows commands, moves all extremities    Lab Results: No results found for this basename: WBC, HGB, HCT, PLT,  in the last 72 hours  BMET  Recent Labs  10/25/12 1055  NA 128*  K 4.8  CL 90*  CO2 26  GLUCOSE 142*  BUN 17  CREATININE 0.77  CALCIUM 8.9    Studies/Results: Dg Chest 2 View  10/25/2012   CLINICAL DATA:  79 year old female shortness of Breath. Metastatic carcinoma.  EXAM: CHEST  2 VIEW  COMPARISON:  Abdomen CT 10/23/2012 and earlier.  FINDINGS: Portable AP semi upright view at at 1534 hrs. Mildly lower lung volumes. Stable cardiac size and mediastinal contours. No pneumothorax or pulmonary edema. No large pleural effusion. No confluent pulmonary opacity.  IMPRESSION: Lower lung volumes, otherwise no acute cardiopulmonary abnormality.   Electronically Signed   By: Augusto Gamble M.D.   On: 10/25/2012 16:50   Dg Abd Portable 1v  10/26/2012   CLINICAL DATA:  Small bowel obstruction  EXAM: PORTABLE ABDOMEN - 1 VIEW  COMPARISON:  Radiograph 10/25/2012, 10/23/2012 CT  FINDINGS: There are mildly dilated loops of small bowel predominantly in the left abdomen measuring up to 3.6 cm which is slightly decreased from 4 cm on prior. There is gas in the rectum. There is interval partial clearing of stool from the colon. .   IMPRESSION: Mild improvement in partial small bowel obstruction.   Electronically Signed   By: Genevive Bi M.D.   On: 10/26/2012 08:58    Medications: I have reviewed the patient's current medications.  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  -Xeloda resumed August 2013, restaging CT 03/06/2012 confirmed disease progression  -initiation of every 2 week panitumumab 04/26/2012. She completed 3 treatments.  2. Diabetes  3. Sleep apnea  4. Depression  5. History of a Skin rash secondary to panitumumab.  6. Pitting edema of the lower legs-likely related to abdominal carcinomatosis and hypoalbuminemia. Improved.  7. 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 Now on a Duragesic patch 8. Ascites. She requires periodic therapeutic paracentesis procedures, last on 10/20/2012   9. Exertional dyspnea-the dyspnea improves after a paracentesis. The dyspnea is most likely related to abdominal distention and atelectasis.  10. Left leg "tightness".Negative left leg Doppler on 09/06/2012 11.admission with an acute CVA on 10/13/2012-? Embolic,? [place] endocarditis-now on the rehabilitation medicine service.  12. Nausea/vomiting -intermittent,? Secondary to the abdominal tumor burden versus a partial small bowel obstruction.   She continues to have intermittent nausea and vomiting, but she has tolerated some liquids and does not appear completely obstructed. She had a significant bowel movement yesterday.  I discussed the disposition options with Ms. Vaccarella and her daughter. The goal is comfort care. She is  no longer a candidate for physical therapy. We discussed home with hospice versus [facility]. It may be difficult to care for  her in the home, especially if she continues to have intermittent vomiting. I recommend [facility] [facility]. Ms. Guinyard will have further discussions with her daughter this morning and make a decision on discharge plans.  Recommendations:   1. Continue comfort care measures 2. Advanced to a liquid diet as tolerated  3. [facility] or home hospice care 4. Palliative paracentesis today  I will plan to follow her with the [facility] hospice program at discharge. If she remains in the hospital I will check on her 10/29/2012.      LOS: 9 days   Hashir Deleeuw  11/14/2012, 8:27 AM

## 2012-10-27 NOTE — Progress Notes (Signed)
Chaplain provided pastoral care and support to patient/family. Chaplain provided emotional, spiritual and pastoral care for the patient as she expressed her thoughts surrounding end-of-life. Chaplain listened supportively as the patient and family shared life stories. Chaplain provided spiritual support through prayer.  11/03/2012 1615  Clinical Encounter Type  Visited With Patient and family together  Visit Type Initial;Spiritual support;Social support  Referral From Nurse  Spiritual Encounters  Spiritual Needs Prayer;Emotional  Stress Factors  Patient Stress Factors Health changes;Major life changes  Family Stress Factors Health changes;Major life changes

## 2012-10-27 NOTE — Discharge Summary (Deleted)
NAME:  [name], [name]              ACCOUNT NO.:  [id number]  MEDICAL RECORD NO.:  [id number]  LOCATION:  4W25C                        FACILITY:  [facility]  PHYSICIAN:  Andrew E. Kirsteins, M.D.DATE OF BIRTH:  11/04/1922  DATE OF ADMISSION:  10/18/2012 DATE OF DISCHARGE:  10/17/2012                              DISCHARGE SUMMARY   DISCHARGE DIAGNOSES: 1. Embolic bi cerebral infarctions. 2. Sequential compression devices for DVT prophylaxis. 3. Metastatic adenocarcinoma with malignant ascites. 4. Partial small bowel obstruction. 5. Depression. 6. Dysphagia. 7. Chronic anemia. 8. Hypertension. 9. Obstructive sleep apnea. 10.Diabetes mellitus with peripheral neuropathy.  HISTORY OF PRESENT ILLNESS:  This is an 79-year-old right-handed female with metastatic adenocarcinoma diagnosed in 2012, previous chemotherapy but stopped due to progression of her cancer, malignant ascites.  The patient independent with a rolling walker prior to admission.  Admitted October 13, 2012, after a fall with noted slurred speech, left-sided weakness.  MRI of the brain showed scattered small acute posterior right MCA territory infarcts.  Several tiny acute infarcts also on the posterior left MCA territory.  MRA of the head negative.  Echocardiogram with ejection fraction of 55%, normal systolic function.  Carotid Dopplers with no ICA stenosis.  The patient did not receive tPA. Neurology Service was consulted, placed on aspirin therapy.  Currently maintained on a dysphagia to thin liquid diet.  Chronic anemia 6.8, she had been transfused.  The patient has been followed by hospice care for progressive metastatic adenocarcinoma.  She does undergo weekly therapeutic paracentesis for chronic ascites and followed by Oncology Services Dr. Sherrill.  Physical and occupational therapy ongoing.  The patient was admitted for comprehensive rehab program.  PAST MEDICAL HISTORY:  See discharge diagnoses.  SOCIAL  HISTORY:  Lives with spouse some personal attendance.  She has a supportive family.  Functional history prior to admission was independent with a rolling walker.  Functional status upon admission to rehab services with minimal assist to ambulate 60 feet, limited endurance.  PHYSICAL EXAMINATION:  VITAL SIGNS:  Blood pressure 126/69, pulse 134, temperature 98.6, respirations 20. GENERAL:  This was an alert female.  She had some short-term memory deficits.  Pupils were round and reactive to light. LUNGS:  Decreased breath sounds.  Clear to auscultation. CARDIAC:  Regular rate and rhythm. ABDOMEN:  Mildly distended.  Good bowel sounds.  REHABILITATION HOSPITAL COURSE:  The patient was admitted to inpatient rehab services with therapies initiated on a 3-hour daily basis consisting of physical therapy, occupational therapy, and rehabilitation nursing.  The following issues were addressed during the patient's rehabilitation stay.  Pertaining to Ms. Sheer's embolic bi cerebral infarction remained stable, maintained on aspirin therapy followed by Neurology Services.  Sequential compression devices in place for DVT prophylaxis.  Pain management was noted history of metastatic adenocarcinoma.  She was on a Duragesic patch as well as a Lidoderm patch for pain control.  Morphine was being used as needed for breakthrough pain.  Throughout her rehab course, noted intermittent bouts of vomiting, nonspecific nausea, bouts of constipation.  CT of the abdomen showed early partial small bowel obstruction with focal transition beneath the mid lower abdominal wall.  General Surgery was consulted in regard to this   felt obstruction was most likely secondary to her cancer of malignancy versus adhesive disease.  Conservative care provided.  There was some initial concern and question of G-tube placement.  General Surgery has not felt the patient could tolerate the procedure all issues in regard to these  findings were discussed with the patient and family.  Her diet was changed to clear liquids and monitored advanced as tolerated.  The patient received weekly collaborative interdisciplinary team conferences to discuss estimated length of stay, family teaching, any barriers to discharge.  Overall, she remained limited due to multiple medical issues.  Hospice had been involved with patient prior to admission as well as consultation made to palliative care.  Dr. Sherrill of Oncology Service continue to provide support in regard to her metastatic adenocarcinoma.  Discharge planning was [facility] versus discharge back to [place] for follow up care with Oncology Services.  Social worker was involved for discharge planning, and all issues again this were discussed with family.  Family did not feel they could provide the necessary care at home due to patient's multiple medical issues.  DISCHARGE MEDICATIONS:  At the time of dictation included: 1. Tylenol 650 mg p.o. every 4 hours as needed for mild pain. 2. Aspirin 325 mg p.o. daily. 3. Coreg 3.125 mg p.o. b.i.d. 4. Dextrose half-normal saline at 100 mL a hour. 5. Colace 100 mg p.o. b.i.d. 6. Pepcid intravenously 20 mg daily. 7. Duragesic patch 12.5 mcg change every 72 hours. 8. Lasix 40 mg p.o. daily. 9. Lidoderm patch change every 12 hours. 10.Ativan 0.25 mg p.o. every 6 hours as needed anxiety. 11.Reglan 10 mg intravenously every 6 hours as needed. 12.Morphine concentrate 5-10 mg sublingual every 4 hours as needed     severe pain. 13.Zofran 8 mg intravenously every 4 hours as needed. 14.Protonix 40 mg intravenous every 12 hours. 15.MiraLax 17 g p.o. daily. 16.Senokot-S tablets 2 p.o. b.i.d. 17.Effexor 75 mg p.o. daily. 18.Smog Enema 160 mL rectally as needed per advisement of General     Surgery.  Her diet was clear liquids again advanced as per General Surgery.  Special instructions are for weekly therapeutic paracentesis  for chronic ascites per Interventional Radiology and followed up per Dr. Brad Sherrill, Oncology Services.     Sharrieff Spratlin, P.A.   ______________________________ Andrew E. Kirsteins, M.D.    DA/MEDQ  D:  10/26/2012  T:  10/16/2012  Job:  [id number]  cc:   Andrew E. Kirsteins, M.D. Gary B Sherrill, M.D. Douglas Blackman, M.D. Robert Nevill Gates, M.D. 

## 2012-10-27 NOTE — Progress Notes (Signed)
Patient ID: [name], female   DOB: 1922/11/25, 79 y.o.   MRN: [id number] 79 y.o. right-handed female with history of metastatic adenocarcinoma diagnosed 2012.previous chemotherapy treatments but stopped due to progression of her cancer, malignant ascites, diabetes mellitus peripheral neuropathy. Patient independent with rolling walker prior to admission. Admitted 10/13/2012 after a fall with noted slurred speech and left-sided weakness. MRI of the brain showed scattered small acute posterior right MCA territory infarcts. Several tiny acute infarcts also in the posterior left MCA territory. MRA of the head negative. Echocardiogram with ejection fraction of 55% normal systolic function. Carotid Dopplers with no ICA stenosis. Patient did not receive TPA. Neurology services consulted placed on aspirin therapy for CVA prophylaxis. Speech therapy for dysphagia currently maintained on a dysphagia 2 thin liquid diet. Noted chronic anemia hemoglobin 6.8 patient has been transfused with latest hemoglobin 10.1. Patient is being followed by hospice care for history of progressive metastatic adenocarcinoma. She does undergo weekly therapeutic paracentesis for chronic ascites and followed by oncology services Dr. Arnoldo Lenis.   Subjective/Complaints: Vomited last noc Case discussed with Dr Truett Perna as well as pt and daughter today Gen Surgery on consult, appreciate notes    A 12 point review of systems has been performed and if not noted above is otherwise negative. . Objective: Vital Signs: Blood pressure 120/63, pulse 105, temperature 97.5 F (36.4 C), temperature source Oral, resp. rate 20, height 5\' 1"  (1.549 m), weight 74.5 kg (164 lb 3.9 oz), SpO2 90.00%. Dg Chest 2 View  10/25/2012   CLINICAL DATA:  79 year old female shortness of Breath. Metastatic carcinoma.  EXAM: CHEST  2 VIEW  COMPARISON:  Abdomen CT 10/23/2012 and earlier.  FINDINGS: Portable AP semi upright view at at 1534 hrs. Mildly lower  lung volumes. Stable cardiac size and mediastinal contours. No pneumothorax or pulmonary edema. No large pleural effusion. No confluent pulmonary opacity.  IMPRESSION: Lower lung volumes, otherwise no acute cardiopulmonary abnormality.   Electronically Signed   By: Augusto Gamble M.D.   On: 10/25/2012 16:50   Dg Abd Portable 1v  10/26/2012   CLINICAL DATA:  Small bowel obstruction  EXAM: PORTABLE ABDOMEN - 1 VIEW  COMPARISON:  Radiograph 10/25/2012, 10/23/2012 CT  FINDINGS: There are mildly dilated loops of small bowel predominantly in the left abdomen measuring up to 3.6 cm which is slightly decreased from 4 cm on prior. There is gas in the rectum. There is interval partial clearing of stool from the colon. .  IMPRESSION: Mild improvement in partial small bowel obstruction.   Electronically Signed   By: Genevive Bi M.D.   On: 10/26/2012 08:58   Results for orders placed during the hospital encounter of 10/18/12 (from the past 72 hour(s))  BASIC METABOLIC PANEL     Status: Abnormal   Collection Time    10/25/12 10:55 AM      Result Value Range   Sodium 128 (*) 135 - 145 mEq/L   Potassium 4.8  3.5 - 5.1 mEq/L   Chloride 90 (*) 96 - 112 mEq/L   CO2 26  19 - 32 mEq/L   Glucose, Bld 142 (*) 70 - 99 mg/dL   BUN 17  6 - 23 mg/dL   Creatinine, Ser 7.82  0.50 - 1.10 mg/dL   Calcium 8.9  8.4 - 95.6 mg/dL   GFR calc non Af Amer 73 (*) >90 mL/min   GFR calc Af Amer 84 (*) >90 mL/min   Comment: (NOTE)     The eGFR  has been calculated using the CKD EPI equation.     This calculation has not been validated in all clinical situations.     eGFR's persistently <90 mL/min signify possible Chronic Kidney     Disease.     HEENT: normal and no drainage Cardio: RRR and no murmurs Resp: CTA B/L and unlabored GI: BS high pitched and tinkling ,  mild distension persistent. Non-tender in Lower quadrants, tenderness and fullness in epigastric area Extremity:  Pulses positive and No Edema Skin:   Intact Neuro:  Alert/Oriented, Flat, Normal Sensory and Normal Motor Musc/Skel:  Normal Gen NAD    Assessment/Plan: 1. Functional deficits secondary to Post R MCA infarct which require 3+ hours per day of interdisciplinary therapy in a comprehensive inpatient rehab setting. Physiatrist is providing close team supervision and 24 hour management of active medical problems listed below. Physiatrist and rehab team continue to assess barriers to discharge/monitor patient progress toward functional and medical goals.  Has intermittent obstruction.  Discussed with Dr Truett Perna.  This has been occuring over several months but is worsening as metastic adenoca of colon is progressing. Pt has essentially finished rehab for CVA. Family does not feel comfortable with taking pt home when she cannot tolerate po. As dicussed with Dr Truett Perna will transfer pt [facility] to [facility] or to his service at [facility] depending upon how aggressive pt. wants to get in terms of treatment Paracentesis today FIM: FIM - Bathing Bathing Steps Patient Completed: Chest;Right Arm;Left Arm;Abdomen;Front perineal area;Right upper leg;Left upper leg;Right lower leg (including foot);Left lower leg (including foot) Bathing: 4: Min-Patient completes 8-9 26f 10 parts or 75+ percent  FIM - Upper Body Dressing/Undressing Upper body dressing/undressing steps patient completed: Thread/unthread right bra strap;Thread/unthread right sleeve of pullover shirt/dresss;Hook/unhook bra Upper body dressing/undressing: 2: Max-Patient completed 25-49% of tasks FIM - Lower Body Dressing/Undressing Lower body dressing/undressing steps patient completed: Thread/unthread right underwear leg;Thread/unthread left underwear leg;Don/Doff right sock;Don/Doff left sock Lower body dressing/undressing: 2: Max-Patient completed 25-49% of tasks  FIM - Toileting Toileting steps completed by patient: Adjust clothing prior to toileting Toileting Assistive Devices:   (walker) Toileting: 2: Max-Patient completed 1 of 3 steps  FIM - Diplomatic Services operational officer Devices: Building control surveyor Transfers: 4-To toilet/BSC: Min A (steadying Pt. > 75%);3-From toilet/BSC: Mod A (lift or lower assist)  FIM - Banker Devices: Walker;Arm rests;Bed rails Bed/Chair Transfer: 3: Sit > Supine: Mod A (lifting assist/Pt. 50-74%/lift 2 legs);5: Chair or W/C > Bed: Supervision (verbal cues/safety issues)  FIM - Locomotion: Wheelchair Distance: 10' Locomotion: Wheelchair: 1: Total Assistance/staff pushes wheelchair (Pt<25%) FIM - Locomotion: Ambulation Locomotion: Ambulation Assistive Devices: Designer, industrial/product Ambulation/Gait Assistance: 4: Min guard Locomotion: Ambulation: 1: Travels less than 50 ft with minimal assistance (Pt.>75%)  Comprehension Comprehension Mode: Auditory Comprehension: 3-Understands basic 50 - 74% of the time/requires cueing 25 - 50%  of the time  Expression Expression Mode: Verbal Expression: 4-Expresses basic 75 - 89% of the time/requires cueing 10 - 24% of the time. Needs helper to occlude trach/needs to repeat words.  Social Interaction Social Interaction: 3-Interacts appropriately 50 - 74% of the time - May be physically or verbally inappropriate.  Problem Solving Problem Solving: 3-Solves basic 50 - 74% of the time/requires cueing 25 - 49% of the time  Memory Memory: 2-Recognizes or recalls 25 - 49% of the time/requires cueing 51 - 75% of the time   Medical Problem List and Plan:  1. Embolic bi cerebral infarcts  2. DVT Prophylaxis/Anticoagulation: SCDs. Monitor for any signs of DVT  3. Pain Management: Duragesic patch 12.5 mcg change every 72 hours, , Lidoderm patch, Ultram as needed. Has prn MSO4 as well Monitor with increased activity  4. Mood: Wellbutrin 100 mg daily, Effexor 75 mg daily. Provide emotional support  5. Neuropsych: This patient is capable of making  decisions on her own behalf.  6. Metastatic adenocarcinoma of colon with malignant ascites. Followup oncology services.  Patient receives weekly paracentesis for malignant ascites per interventional radiology -7. Dysphagia. Dysphagia 2 nectar liquids. Followup speech therapy. Monitor for any aspiration  8. Chronic anemia. Patient has been transfused.following clinically --9.9 9. Hypertension. Coreg 3.125 mg twice a day. Monitor with increased activity  10. Obstructive sleep apnea. Continue CPAP  11. Diabetes mellitus with peripheral neuropathy. She is presently off her Glucophage 500 mg twice a day as prior to admission. Hemoglobin A1c 6.2. Check blood sugars a.c. and at bedtime  12. Constipation and likely associated nause/vomiting- partial SBO related to carcinomatosis as well as chronic obstipation, ,surgery to f/u with recs   Appreciate palliative input    LOS (Days) 9 A FACE TO FACE EVALUATION WAS PERFORMED  Donya Tomaro E 10/21/2012, 8:13 AM

## 2012-10-27 NOTE — Procedures (Signed)
Successful US guided paracentesis from RLQ.  Yielded 650cc of blood tinged fluid.  No immediate complications.  Pt tolerated well.   Specimen was not sent for labs.  Pattricia Boss D PA-C 10/20/2012 11:09 AM

## 2012-10-27 NOTE — Progress Notes (Signed)
I have reviewed the events from overnight and evaluated the patient-she continues to have acute care needs and family/patient would like to pursue active management of her SBO-including optimizing labwork, imaging to assess interval improvement and IV medications to management the symptoms of her obstruction. Would be reasonable to transition to acute care/hospitalists to see how this SBO evolves and then to pursue [facility] and home with hospice options based on how she does over the next couple of days. She is resting peacefully this AM, no further vomiting since midnight. Will follow.  Anderson Malta, DO Palliative Medicine

## 2012-10-27 NOTE — Progress Notes (Addendum)
SLP Cancellation Note  Patient Details Name: [name] MRN: [id number] DOB: 1922/10/21    Cancelled treatment:        Patient missed 30 minutes of skilled SLP service due to verbal order to hold therapies as a result of change in medical status over night.  Per chart review patient currently NPO and probably being transferred of floor; patient's family would benefit from education regarding recommended safe swallow compensatory strategies to minimize aspiration risk if clear liquid diet is resumed.    Addendum to morning note: Patient has transferred off unit to acute care due to decline in medical status and as a result, patient has been discharged from rehab speech caseload.  Please order acute SLP services as needed.   [name], M.A., [name] [phone]  [name] 11/12/2012, 11:37 AM

## 2012-10-27 NOTE — Progress Notes (Signed)
Pt transferred to 6N07, report called to [facility], Pt transferred to assess Small Bowel Obstruction

## 2012-10-27 NOTE — H&P (Signed)
[facility] History and Physical  [name] RUE:[id number] DOB: 1923/01/17 DOA: 11/10/2012  Referring physician:  [name] PCP:  Pearla Dubonnet, MD   Chief Complaint:  Nausea, constipation, partial small bowel obstruction  HPI:  The patient is a 79 y.o. 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 metastatic colorectal carcinoma s/p chemotherapy which she has failed, followed by Dr. Truett Perna, malignant ascites s/p paracentesis q1week diabetes, partial small bowel obstruction, OSA, depression, who presented with left-sided weakness and dysphagia due to CVA on 10/13/2012.  She was started on fentanyl patch for ongoing malignancy-related pain which also helped with her nausea.  She was discharged to rehabilitation where she continued to struggle with nausea, vomiting, and constipation.  CT scan on 9/8 demonstrated partial small bowel obstruction with focal transition beneath the mid lower abdominal wall near her ventral mesh.  She had air-fluid levels, clear carcinomatosis, and copious stool within the colon.  She was seen by general surgery who felt that she would be a poor candidate for laparoscopy or palliative G-tube.  She was seen by palliative care who assisted with constipation relief by administering enemas and methylnaltrexone.  She had several large BMs yesterday.  She had about 10-tbs of food by mouth yesterday and then was started on oral medications.  Overnight, however, she developed nausea and vomiting that was rust colored with some blood flecks.  Patient reports only one small emesis, however, nursing reported that she had several large volume emeses.  Today, KUB demonstrated dilated bowel loops suggestive of SBO and she underwent paracentesis for comfort.  She is being transferred from rehab to the medicine service for ongoing management of SBO and transition to [facility].    Review of  Systems:  General:  Denies fevers, chills HEENT:  Denies changes to hearing and vision, rhinorrhea, sinus congestion, sore throat CV:  Denies chest pain and palpitations PULM:  + SOB, wheezing, and cough.   GI:  Per HPI GU:  Decreased uop over last 24 hours.   ENDO:  Denies polyuria, polydipsia.   HEME:  Possible hematemesis  LYMPH:  Denies lymphadenopathy.   MSK:  Chronic pain in back improved.  DERM:  Denies skin rash or ulcer.   NEURO:  Persistent mild slurred speech and some weakness of the left extremities.    PSYCH:  Denies anxiety and depression.    Past Medical History  Diagnosis Date  . Cataract   . Diabetes mellitus   . Cancer    Past Surgical History  Procedure Laterality Date  . Bladder surgery  06/1989  . Knee surgery  10/30/1993    Lt   . Back surgery  20/11/1998    ruptured disc  . Colon surgery    . Hernia repair    . Carpal tunnel Bilateral   . Achilles tendon repair     Social History:  reports that she has never smoked. She has never used smokeless tobacco. She reports that she does not drink alcohol. Her drug history is not on file.  Allergies  Allergen Reactions  . Oxycodone Other (See Comments)    hallucinations  . Ace Inhibitors Other (See Comments)    Reaction unknown  . Ciprofloxacin Hcl Other (See Comments)    Reaction unknown  . Hydrocodone-Acetaminophen Other (See Comments)    Reports made her "crazy"    Family History  Problem Relation Age of Onset  . Colon cancer Brother   . Stroke Sister  Prior to Admission medications   Medication Sig Start Date End Date Taking? Authorizing Provider  buPROPion (WELLBUTRIN SR) 100 MG 12 hr tablet Take 100 mg by mouth daily.  03/22/12  Yes Historical Provider, MD  carvedilol (COREG) 3.125 MG tablet Take 3.125 mg by mouth 2 (two) times daily with a meal.     Yes Historical Provider, MD  fentaNYL (DURAGESIC - DOSED MCG/HR) 12 MCG/HR Place 1 patch onto the skin every 3 (three) days.   Yes Historical  Provider, MD  KLOR-CON M20 20 MEQ tablet Take 10 mEq by mouth daily.  09/24/12  Yes Historical Provider, MD  lidocaine (LIDODERM) 5 % Place 1 patch onto the skin daily. Remove & Discard patch within 12 hours or as directed by MD 10/18/12  Yes Marden Noble, MD  ondansetron (ZOFRAN) 4 MG tablet Take 1 tablet (4 mg total) by mouth every 8 (eight) hours as needed. 10/18/12  Yes Marden Noble, MD  polyethylene glycol [facility] / GLYCOLAX) packet Take 17 g by mouth daily.   Yes Historical Provider, MD  senna (SENOKOT) 8.6 MG tablet Take 1 tablet by mouth 2 (two) times daily.    Yes Historical Provider, MD  traMADol (ULTRAM) 50 MG tablet Take 100 mg by mouth every 6 (six) hours as needed for pain.   Yes Historical Provider, MD  venlafaxine XR (EFFEXOR-XR) 75 MG 24 hr capsule Take 75 mg by mouth daily.   Yes Historical Provider, MD   Physical Exam: There were no vitals filed for this visit.   General:  Thin CF, no acute distress  Eyes:  PERRL, anicteric, non-injected.  ENT:  Nares clear.  OP clear, non-erythematous without plaques or exudates.  MMM.  Neck:  Supple without TM or JVD.    Lymph:  No cervical, supraclavicular, or submandibular LAD.  Cardiovascular:  RRR, normal S1, S2, without m/r/g.  2+ pulses, warm extremities  Respiratory:  Diminished left base to mid back and course bilateral rales to the apex bilaterally, frequent cough with intermittent pursed lip breathing.  No wheeze  Abdomen:  High pitched active BS.  Soft, moderately distended, nontender to palpation    Skin:  No rashes or focal lesions.  Musculoskeletal:  Normal bulk and tone.  No LE edema.  Psychiatric:  A & O x 4.  Appropriate affect.  Neurologic:  CN 3-12 intact.  5/5 strength.  Sensation intact.  Labs on Admission:  Basic Metabolic Panel:  Recent Labs Lab 10/23/12 1552 10/25/12 1055  NA 133* 128*  K 5.2* 4.8  CL 91* 90*  CO2 33* 26  GLUCOSE 181* 142*  BUN 24* 17  CREATININE 1.11* 0.77  CALCIUM 9.2 8.9    Liver Function Tests: No results found for this basename: AST, ALT, ALKPHOS, BILITOT, PROT, ALBUMIN,  in the last 168 hours No results found for this basename: LIPASE, AMYLASE,  in the last 168 hours No results found for this basename: AMMONIA,  in the last 168 hours CBC:  Recent Labs Lab 10/23/12 1552  WBC 11.1*  HGB 10.4*  HCT 35.3*  MCV 78.6  PLT 344   Cardiac Enzymes: No results found for this basename: CKTOTAL, CKMB, CKMBINDEX, TROPONINI,  in the last 168 hours  BNP (last 3 results) No results found for this basename: PROBNP,  in the last 8760 hours CBG: No results found for this basename: GLUCAP,  in the last 168 hours  Radiological Exams on Admission: US Paracentesis  10/17/2012   *RADIOLOGY REPORT*  Clinical Data: metastatic  adenocarcinoma, recurrent ascites  ULTRASOUND GUIDED PARACENTESIS  An ultrasound guided paracentesis was thoroughly discussed with the patient and questions answered.  The benefits, risks, alternatives and complications were also discussed.  The patient understands and wishes to proceed with the procedure.  Written consent was obtained.  Ultrasound was performed to localize and mark an adequate pocket of fluid in the right lower quadrant of the abdomen.  The area was then prepped and draped in the normal sterile fashion.  1% Lidocaine was used for local anesthesia.  Under ultrasound guidance a 19 gauge Yueh catheter was introduced.  Paracentesis was performed.  The catheter was removed and a dressing applied.  Complications:  none  Findings:  A total of approximately 650cc of blood-tinged fluid was removed.  A fluid sample was not sent for laboratory analysis.  IMPRESSION: Successful ultrasound guided paracentesis yielding 650cc of ascites.  Read By: Pattricia Boss PA-C   Original Report Authenticated By: Malachy Moan, M.D.   Dg Chest Port 1 View  11-01-2012   CLINICAL DATA:  Shortness of breath. Diminished breath sounds at right base.  EXAM: PORTABLE  CHEST - 1 VIEW  COMPARISON:  10/25/2012  FINDINGS: Bibasilar airspace opacities have increased since prior study, atelectasis or infiltrates. Heart is normal size. Mild vascular congestion. No visible effusions or edema. Advanced degenerative changes in the shoulders.  IMPRESSION: Increasing bibasilar atelectasis or infiltrates.  Mild vascular congestion.   Electronically Signed   By: Charlett Nose M.D.   On: 2012/11/01 18:20   Dg Abd Portable 1v  November 01, 2012   *RADIOLOGY REPORT*  Clinical Data: Abdominal pain.  PORTABLE ABDOMEN - 1 VIEW  Comparison: 10/26/2012.  Findings: Dilated small bowel loops measuring up to 5.3 cm versus prior 3.6 cm.  No  progression of gas into distal small bowel loops or colon.  Findings suggestive of small bowel obstruction.  The possibility of free intraperitoneal air cannot be addressed on a supine view.  IMPRESSION: Findings raise possibility of small bowel obstruction as detailed above.  This is a call report.   Original Report Authenticated By: Lacy Duverney, M.D.   Dg Abd Portable 1v  10/26/2012   CLINICAL DATA:  Small bowel obstruction  EXAM: PORTABLE ABDOMEN - 1 VIEW  COMPARISON:  Radiograph 10/25/2012, 10/23/2012 CT  FINDINGS: There are mildly dilated loops of small bowel predominantly in the left abdomen measuring up to 3.6 cm which is slightly decreased from 4 cm on prior. There is gas in the rectum. There is interval partial clearing of stool from the colon. .  IMPRESSION: Mild improvement in partial small bowel obstruction.   Electronically Signed   By: Genevive Bi M.D.   On: 10/26/2012 08:58    Assessment/Plan Principal Problem:   Small bowel obstruction Active Problems:   DYSPNEA   CVA (cerebral infarction)   Metastatic adenocarcinoma   Malignant ascites   Nausea and vomiting   Unspecified constipation  ---  SBO, may be partially due to narcotic-induced constipation vs. Malignancy.  -  Continue enemas and bisacodyl -  Repeat dose of  methylnaltrexone tomorrow (seemed to help), but does increase risk of bowel perforation so minimize if possible.  -  Start dexamethasone 4mg  IV BID  -  CLD if tolerated -  Hold all oral medications for now -  If worsening abdominal distension or vomiting, then NPO with NG to LIS -  Continue zofran and phenergan IV  -  Appreciate palliative care and surgery recommendations  Hematemesis -  Start protonix 40mg   IV BID -  Palliative care recommending addition of famotidine.  Dyspnea with rales, possibly due to hypoalbuminemia vs. Diastolic heart failure vs. Atelectasis.  No fevers to suggest PNA, however, given emesis overnight, at risk for aspiration PNA.  -   CXR:  Bilateral infiltrates vs. Edema and vascular congestion -  D/C IVF and give dose of IV lasix -  If no improvement with lasix, would more strongly consider aspiration pneumonia.  -  Would discuss desire for abx in the setting of hospice care before initiating, however  CVA, mild persistent dysarthria and left sided weakness.    Metastatic adenocarcinoma -  Followed by Dr. Truett Perna -  Continue palliative paracenteses for comfort if needed, but right now, most of her distension seems to be coming from bowel distension with gas and fluids rather than ascites.   -  Last paracentesis on 9/12  Chronic pain -  Continue fentanyl patch -  Add prn IV morphine  Depression/anxiety, holding oral medications -  Ativan prn anxiety and sleep  Diet:  CLD Access:  PIV IVF:  OFF Proph:  SCDs  Code Status: DNR, hospice Family Communication: spoke with patient and her daughters Disposition Plan: Admit to med-surg  Time spent: 60 min Renae Fickle [facility] Pager [phone]  If 7PM-7AM, please contact night-coverage [email or web address] Password [facility] 10/23/2012, 6:23 PM

## 2012-10-27 NOTE — Progress Notes (Signed)
I have seen and examined the patient and agree with the assessment and plans. Again, no plans for laparotomy  [name] A. Magnus Ivan  MD, FACS

## 2012-10-27 NOTE — Discharge Summary (Signed)
Discharge summary job # [id number]

## 2012-10-27 NOTE — Progress Notes (Signed)
Zofran 8mg /NS 50 ml IVPB at 2144 given for nausea and vomiting. Patient vomited a medium amount of brown emesis as per NT report.  Morphine sulfate 10 mg sublingual given for stomach pain and right shoulder pain. O2 4L via nasal cannula in place and functioning. Charge Nurse notified. Vital signs taken and recorded. No distress noted. Call bell within reach. Resting quietly at this time. Will continue to monitor frequently.

## 2012-10-27 NOTE — Progress Notes (Signed)
Physical Therapy Note  Patient Details  Name: [name] MRN: [id number] Date of Birth: [birth year] Today's Date: 11-19-2012  Pt missed 60 mins of PT time this morning due to continued nausea/vomiting and verbal orders per oncology MD to hold therapy.  Will need to continue to assess pts D/C plans and incorporate family training if needed.    Vista Deck 11-19-2012, 11:57 AM

## 2012-10-27 NOTE — Progress Notes (Signed)
Cal from radiology to report: finding on abdominal xray indicates Small Bowel Obstruction; findings reported to Harvel Ricks PA

## 2012-10-27 NOTE — Progress Notes (Signed)
Social Work Patient ID: [name], female   DOB: 1922/05/15, 79 y.o.   MRN: [id number] Working on if bed available at [facility] for today.  Eva-SW to let this worker know.

## 2012-10-27 NOTE — Discharge Summary (Signed)
[name], [name]              ACCOUNT NO.:  [id number]  MEDICAL RECORD NO.:  [id number]  LOCATION:  [id number]                        FACILITY:  [facility]  PHYSICIAN:  Erick Colace, M.D.DATE OF BIRTH:  05-11-1922  DATE OF ADMISSION:  10/18/2012 DATE OF DISCHARGE:  11/13/2012                              DISCHARGE SUMMARY   DISCHARGE DIAGNOSES: 1. Embolic bi cerebral infarctions. 2. Sequential compression devices for DVT prophylaxis. 3. Metastatic adenocarcinoma with malignant ascites. 4. Partial small bowel obstruction. 5. Depression. 6. Dysphagia. 7. Chronic anemia. 8. Hypertension. 9. Obstructive sleep apnea. 10.Diabetes mellitus with peripheral neuropathy.  HISTORY OF PRESENT ILLNESS:  This is an 79 year old right-handed female with metastatic adenocarcinoma diagnosed in 2012, previous chemotherapy but stopped due to progression of her cancer, malignant ascites.  The patient independent with a rolling walker prior to admission.  Admitted October 13, 2012, after a fall with noted slurred speech, left-sided weakness.  MRI of the brain showed scattered small acute posterior right MCA territory infarcts.  Several tiny acute infarcts also on the posterior left MCA territory.  MRA of the head negative.  Echocardiogram with ejection fraction of 55%, normal systolic function.  Carotid Dopplers with no ICA stenosis.  The patient did not receive tPA. Neurology Service was consulted, placed on aspirin therapy.  Currently maintained on a dysphagia to thin liquid diet.  Chronic anemia 6.8, she had been transfused.  The patient has been followed by hospice care for progressive metastatic adenocarcinoma.  She does undergo weekly therapeutic paracentesis for chronic ascites and followed by Oncology Services Dr. Truett Perna.  Physical and occupational therapy ongoing.  The patient was admitted for comprehensive rehab program.  PAST MEDICAL HISTORY:  See discharge diagnoses.  SOCIAL  HISTORY:  Lives with spouse some personal attendance.  She has a supportive family.  Functional history prior to admission was independent with a rolling walker.  Functional status upon admission to rehab services with minimal assist to ambulate 60 feet, limited endurance.  PHYSICAL EXAMINATION:  VITAL SIGNS:  Blood pressure 126/69, pulse 134, temperature 98.6, respirations 20. GENERAL:  This was an alert female.  She had some short-term memory deficits.  Pupils were round and reactive to light. LUNGS:  Decreased breath sounds.  Clear to auscultation. CARDIAC:  Regular rate and rhythm. ABDOMEN:  Mildly distended.  Good bowel sounds.  REHABILITATION HOSPITAL COURSE:  The patient was admitted to inpatient rehab services with therapies initiated on a 3-hour daily basis consisting of physical therapy, occupational therapy, and rehabilitation nursing.  The following issues were addressed during the patient's rehabilitation stay.  Pertaining to Ms. Bleau's embolic bi cerebral infarction remained stable, maintained on aspirin therapy followed by Neurology Services.  Sequential compression devices in place for DVT prophylaxis.  Pain management was noted history of metastatic adenocarcinoma.  She was on a Duragesic patch as well as a Lidoderm patch for pain control.  Morphine was being used as needed for breakthrough pain.  Throughout her rehab course, noted intermittent bouts of vomiting, nonspecific nausea, bouts of constipation.  CT of the abdomen showed early partial small bowel obstruction with focal transition beneath the mid lower abdominal wall.  General Surgery was consulted in regard to this  felt obstruction was most likely secondary to her cancer of malignancy versus adhesive disease.  Conservative care provided.  There was some initial concern and question of G-tube placement.  General Surgery has not felt the patient could tolerate the procedure all issues in regard to these  findings were discussed with the patient and family.  Her diet was changed to clear liquids and monitored advanced as tolerated.  The patient received weekly collaborative interdisciplinary team conferences to discuss estimated length of stay, family teaching, any barriers to discharge.  Overall, she remained limited due to multiple medical issues.  Hospice had been involved with patient prior to admission as well as consultation made to palliative care.  Dr. Truett Perna of Oncology Service continue to provide support in regard to her metastatic adenocarcinoma.  Discharge planning was [facility] versus discharge back to [facility] for follow up care with Oncology Services.  Social worker was involved for discharge planning, and all issues again this were discussed with family.  Family did not feel they could provide the necessary care at home due to patient's multiple medical issues.  DISCHARGE MEDICATIONS:  At the time of dictation included: 1. Tylenol 650 mg p.o. every 4 hours as needed for mild pain. 2. Aspirin 325 mg p.o. daily. 3. Coreg 3.125 mg p.o. b.i.d. 4. Dextrose half-normal saline at 100 mL a hour. 5. Colace 100 mg p.o. b.i.d. 6. Pepcid intravenously 20 mg daily. 7. Duragesic patch 12.5 mcg change every 72 hours. 8. Lasix 40 mg p.o. daily. 9. Lidoderm patch change every 12 hours. 10.Ativan 0.25 mg p.o. every 6 hours as needed anxiety. 11.Reglan 10 mg intravenously every 6 hours as needed. 12.Morphine concentrate 5-10 mg sublingual every 4 hours as needed     severe pain. 13.Zofran 8 mg intravenously every 4 hours as needed. 14.Protonix 40 mg intravenous every 12 hours. 15.MiraLax 17 g p.o. daily. 16.Senokot-S tablets 2 p.o. b.i.d. 17.Effexor 75 mg p.o. daily. 18.Smog Enema 160 mL rectally as needed per advisement of General     Surgery.  Her diet was clear liquids again advanced as per General Surgery.  Special instructions are for weekly therapeutic paracentesis  for chronic ascites per Interventional Radiology and followed up per Dr. Mancel Bale, Oncology Services.     [name], P.A.   ______________________________ Erick Colace, M.D.    DA/MEDQ  D:  10/18/2012  T:  11/11/2012  Job:  [id number]  cc:   Erick Colace, M.D. Ladene Artist, M.D. Abigail Miyamoto, M.D. Pearla Dubonnet, M.D.

## 2012-10-27 NOTE — Progress Notes (Signed)
Patient ID: [name], female   DOB: [birth year], 80 y.o.   MRN: [id number]  I again had a discussion with the patient and this time her other daughter.  She definitely has a SBO which is probably from the carcinomatosis but I can not totally rule out adhesions.  Regardless, she is not an operative candidate.  I believe she would have a hard time coming off a ventilator, she is in a nutritionally poor state, and she has persist malignant ascites.  I don't think she would heal her wound without a ascites leak.  I don't believe a palliative G-tube could even be performed.  I would not recommend any further CT scans or workup of the obstruction as this would not change the course of action.  At most, she needs NG decompression.  This is unfortunately an end of life issue.

## 2012-10-27 NOTE — Progress Notes (Addendum)
Noted patient's difficulty with NV overnight. Will initiate treatment for presumed malignant obstruction. Started IV PPI, IV famotidine, Decadron IV and Reglan IV. Since her presumed point of obstruction is very distal she has a large reserve for intestinal obstruction. Will avoid NG tube and use only as a last resort-paracentesis tomorrow should help relieve pressure. NPO except for ice ships and start IV fluids as temporary measure and hold lasix temporarily. Will also order a low dose benzo for nausea and sleep-family at bedside for safety. Interval KUB ordered for Am.  Anderson Malta, DO Palliative Medicine

## 2012-10-27 NOTE — Progress Notes (Signed)
Social Work Patient ID: [name], female   DOB: February 18, 1922, 79 y.o.   MRN: [id number] Met with pt and daughter who reports pt being transferred to acute to be monitored and allow some time regarding SBO. Informed acute social worker to follow and assist with discharge planning needs.  Offered support and both-pt and daughter appreciative  Of the care on rehab.

## 2012-10-27 NOTE — Progress Notes (Signed)
Occupational Therapy Discharge Patient Details Name: [name] MRN: [id number] DOB: [birth year] Today's Date: 10/19/2012  Patient discharged from OT services secondary to medical decline and return to acute care.  Please see latest therapy progress note for current level of functioning and progress toward goals.    Progress and discharge plan discussed with patient and/or caregiver: Patient/Caregiver agrees with plan   [facility] OTR/L Pager number [phone] 11/01/2012, 4:18 PM

## 2012-10-28 DIAGNOSIS — C800 Disseminated malignant neoplasm, unspecified: Secondary | ICD-10-CM

## 2012-10-28 DIAGNOSIS — Z515 Encounter for palliative care: Secondary | ICD-10-CM

## 2012-10-28 DIAGNOSIS — K56609 Unspecified intestinal obstruction, unspecified as to partial versus complete obstruction: Secondary | ICD-10-CM

## 2012-10-28 DIAGNOSIS — K769 Liver disease, unspecified: Secondary | ICD-10-CM

## 2012-10-28 LAB — BASIC METABOLIC PANEL
BUN: 17 mg/dL (ref 6–23)
Creatinine, Ser: 0.87 mg/dL (ref 0.50–1.10)
GFR calc Af Amer: 67 mL/min — ABNORMAL LOW (ref >90)
GFR calc non Af Amer: 58 mL/min — ABNORMAL LOW (ref >90)
Potassium: 4.4 mEq/L (ref 3.5–5.1)

## 2012-10-28 LAB — CBC
MCHC: 30.2 g/dL (ref 30.0–36.0)
RDW: 22.5 % — ABNORMAL HIGH (ref 11.5–15.5)
WBC: 12.3 10*3/uL — ABNORMAL HIGH (ref 4.0–10.5)

## 2012-10-28 MED ORDER — SORBITOL 70 % SOLN
30.0000 mL | Freq: Two times a day (BID) | Status: DC
  Administered 2012-10-28: 30 mL via ORAL
  Filled 2012-10-28 (×4): qty 30

## 2012-10-28 MED ORDER — FUROSEMIDE 10 MG/ML IJ SOLN
20.0000 mg | Freq: Every day | INTRAMUSCULAR | Status: DC
  Administered 2012-10-28: 20 mg via INTRAVENOUS
  Filled 2012-10-28 (×2): qty 2

## 2012-10-28 NOTE — Progress Notes (Signed)
Subjective: Alert. Cooperative. In no distress. Denies nausea at present, but this has been intermittent. No emesis since arrival [place]. In continent of urine. Says she feels a little tight but denies pain.  Objective: Vital signs in last 24 hours: Temp:  [97.4 F (36.3 C)-98.2 F (36.8 C)] 97.4 F (36.3 C) (09/13 0559) Pulse Rate:  [115-118] 115 (09/13 0559) Resp:  [18-20] 20 (09/13 0559) BP: (104-130)/(47-70) 125/47 mmHg (09/13 0559) SpO2:  [91 %] 91 % (09/13 0559) Weight:  [164 lb 3.9 oz (74.5 kg)] 164 lb 3.9 oz (74.5 kg) (09/12 2300) Last BM Date: 10/26/12  Intake/Output from previous day: 09/12 0701 - 09/13 0700 In: 120 [P.O.:120] Out: -  Intake/Output this shift: Total I/O In: 120 [P.O.:120] Out: -   General appearance: alert. Does not appear to be in distress. Minimal confusion. Obese. Feeble. GI: abdomen obese.  Soft but somewhat distended. No hernias detected. Palpable nodules lower midline and left lower quadrant, consistent with tumor implants. Not tender. No peritoneal signs.  Lab Results:  No results found for this basename: WBC, HGB, HCT, PLT,  in the last 72 hours BMET  Recent Labs  10/25/12 1055  NA 128*  K 4.8  CL 90*  CO2 26  GLUCOSE 142*  BUN 17  CREATININE 0.77  CALCIUM 8.9   PT/INR No results found for this basename: LABPROT, INR,  in the last 72 hours ABG No results found for this basename: PHART, PCO2, PO2, HCO3,  in the last 72 hours  Studies/Results: US Paracentesis  November 26, 2012   *RADIOLOGY REPORT*  Clinical Data: metastatic adenocarcinoma, recurrent ascites  ULTRASOUND GUIDED PARACENTESIS  An ultrasound guided paracentesis was thoroughly discussed with the patient and questions answered.  The benefits, risks, alternatives and complications were also discussed.  The patient understands and wishes to proceed with the procedure.  Written consent was obtained.  Ultrasound was performed to localize and mark an adequate pocket of fluid  in the right lower quadrant of the abdomen.  The area was then prepped and draped in the normal sterile fashion.  1% Lidocaine was used for local anesthesia.  Under ultrasound guidance a 19 gauge Yueh catheter was introduced.  Paracentesis was performed.  The catheter was removed and a dressing applied.  Complications:  none  Findings:  A total of approximately 650cc of blood-tinged fluid was removed.  A fluid sample was not sent for laboratory analysis.  IMPRESSION: Successful ultrasound guided paracentesis yielding 650cc of ascites.  Read By: Pattricia Boss PA-C   Original Report Authenticated By: Malachy Moan, M.D.   Dg Chest Port 1 View  11-26-2012   CLINICAL DATA:  Shortness of breath. Diminished breath sounds at right base.  EXAM: PORTABLE CHEST - 1 VIEW  COMPARISON:  10/25/2012  FINDINGS: Bibasilar airspace opacities have increased since prior study, atelectasis or infiltrates. Heart is normal size. Mild vascular congestion. No visible effusions or edema. Advanced degenerative changes in the shoulders.  IMPRESSION: Increasing bibasilar atelectasis or infiltrates.  Mild vascular congestion.   Electronically Signed   By: Charlett Nose M.D.   On: 11/26/12 18:20   Dg Abd Portable 1v  11-26-2012   *RADIOLOGY REPORT*  Clinical Data: Abdominal pain.  PORTABLE ABDOMEN - 1 VIEW  Comparison: 10/26/2012.  Findings: Dilated small bowel loops measuring up to 5.3 cm versus prior 3.6 cm.  No  progression of gas into distal small bowel loops or colon.  Findings suggestive of small bowel obstruction.  The possibility of free intraperitoneal air  cannot be addressed on a supine view.  IMPRESSION: Findings raise possibility of small bowel obstruction as detailed above.  This is a call report.   Original Report Authenticated By: Lacy Duverney, M.D.   Dg Abd Portable 1v  10/26/2012   CLINICAL DATA:  Small bowel obstruction  EXAM: PORTABLE ABDOMEN - 1 VIEW  COMPARISON:  Radiograph 10/25/2012, 10/23/2012 CT  FINDINGS:  There are mildly dilated loops of small bowel predominantly in the left abdomen measuring up to 3.6 cm which is slightly decreased from 4 cm on prior. There is gas in the rectum. There is interval partial clearing of stool from the colon. .  IMPRESSION: Mild improvement in partial small bowel obstruction.   Electronically Signed   By: Genevive Bi M.D.   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 She is not an operative candidate. I do not believe that any further invasive procedures would alter her course. No indication for NG tube at present, unless emesis becomes a problem. The focus and goals of her care should be palliative, and EOL discussions, In my opinion.   LOS: 1 day    Ernestene Mention 10/28/2012

## 2012-10-28 NOTE — Progress Notes (Signed)
Ms. [name] looks much better this AM. She has not vomited since 12AM on 9/12. Her abdomen is much more soft, she is s/p paracentesis. She has been taking clears without difficulty. She is also OOB, looking much more energetic and is talking much more than on my previous visits with her. She is more SOB, but I suspect this is with her increased energy level and OOB activity.   Re-addressed Goals of Care:  1. Patient is feeling overwhelmed and heavily burdened by the pressure to make a decision about going home or to [facility]. She is desperately trying to get specific prognostic information and reports feeling confused about "when am I going to die". I spent most of our conversation today talking about focusing on one day at a time, living as well as possible and not focusing on the "dying part" which is clearly distressing her-We talked about how little control-if any we have over that part of her terminal illness- her daughters and the patient her self endorse that she felt like she was told she was actively dying-and her response to that was to "shut down" and that she being very accepting of her perceived prognosis elected to "roll over"- she tells me with a grin that she didn't die last night. She has a lovely sense of humor and even in her very anxious and stressed state we were able to share several good laughs. She doesn't want her daughters to feel any guilt or that they have made any wrong decisions.  2. I told Ms Fillingim that I feel strongly that we should not rush to any decisions about her discharge plan because we simply at this point do not know what her care needs will be- she seems to be taking in liquids fine and her overall condition appears much improved-even from the time of my first consult. Her need for more time is most reasonable. She is very accepting of her condition and is extremely realistic. She may indeed have a persistent malignant obstruction and certainly has a large tumor  burden in her abdomen which may get worse quickly-everyone understands that but for now she is improving at least symptomatically and I have also maintained her hope that this may resolve-even if for a short period of time.  3. I also provided reassurance that there were no wrong decisions or decisions that couldn't be altered or changed. She expressed appreciation for relieving this concern- I reassured her I would make a strong medical recommendation for her discharge plan based on what I have learned about her circumstances, her family and well stated goals.  4. For now continue with conservative management of SBO: check BMET in AM and Interval KUB. I have advanced her diet to fulls. Encouraged mobility.  5. For her dyspnea she needs daily Lasix-CXR with edema, atelectasis.  6. Continue steroids and malignant obstruction IV meds.   Anderson Malta, DO Palliative Medicine

## 2012-10-28 NOTE — Progress Notes (Signed)
Events reviewed with patient and family. Discussed issues related to trajectory of disease, symptom control, support at home. We did not make any decisions, but I hope that my time with her will help her frame the decisions she has to make in regard to home vs [facility].   She is comfortable and she has no nausea at this time. One concern is whether symptom control would be as good at home as at [facility].

## 2012-10-29 DIAGNOSIS — R5381 Other malaise: Secondary | ICD-10-CM

## 2012-10-29 DIAGNOSIS — Z515 Encounter for palliative care: Secondary | ICD-10-CM

## 2012-10-29 DIAGNOSIS — K5669 Other intestinal obstruction: Secondary | ICD-10-CM

## 2012-10-29 DIAGNOSIS — IMO0002 Reserved for concepts with insufficient information to code with codable children: Secondary | ICD-10-CM

## 2012-10-29 DIAGNOSIS — R0989 Other specified symptoms and signs involving the circulatory and respiratory systems: Secondary | ICD-10-CM

## 2012-10-29 DIAGNOSIS — I6330 Cerebral infarction due to thrombosis of unspecified cerebral artery: Secondary | ICD-10-CM

## 2012-10-29 DIAGNOSIS — K769 Liver disease, unspecified: Secondary | ICD-10-CM

## 2012-10-29 DIAGNOSIS — R0609 Other forms of dyspnea: Secondary | ICD-10-CM

## 2012-10-29 MED ORDER — DEXAMETHASONE SODIUM PHOSPHATE 4 MG/ML IJ SOLN
4.0000 mg | INTRAMUSCULAR | Status: DC
  Filled 2012-10-29: qty 1

## 2012-10-29 MED ORDER — SODIUM CHLORIDE 0.9 % IV SOLN
1.0000 mg/h | INTRAVENOUS | Status: DC
  Administered 2012-10-29: 1 mg/h via INTRAVENOUS
  Filled 2012-10-29 (×2): qty 25

## 2012-10-29 MED ORDER — MORPHINE BOLUS VIA INFUSION
2.0000 mg | INTRAVENOUS | Status: DC | PRN
  Filled 2012-10-29: qty 2

## 2012-10-29 MED ORDER — ATROPINE SULFATE 1 % OP SOLN: 4.0000 [drp] | OPHTHALMIC | Status: DC | PRN

## 2012-10-29 MED ORDER — MORPHINE SULFATE 2 MG/ML IJ SOLN
2.0000 mg | INTRAMUSCULAR | Status: DC | PRN
  Administered 2012-10-29: 2 mg via INTRAVENOUS
  Filled 2012-10-29 (×3): qty 1

## 2012-10-29 NOTE — Progress Notes (Signed)
Events of the night noted. Dr. Lamar Blinks help very much appreciated.   I really had nothing to add.

## 2012-10-29 NOTE — Progress Notes (Signed)
Subjective: Arousable, but much more lethargic and less communicative this morning. One episode of low volume emesis. Had some pain and received 1 mg morphine at 3 AM.  Progress notes from Dr. Earl Gala and Dr. Phillips Odor reviewed.  Objective: Vital signs in last 24 hours: Temp:  [98.6 F (37 C)] 98.6 F (37 C) (09/14 0609) Pulse Rate:  [115] 115 (09/14 0609) Resp:  [19] 19 (09/14 0609) BP: (119)/(61) 119/61 mmHg (09/14 0609) SpO2:  [84 %] 84 % (09/14 0609) Last BM Date: 10/26/12  Intake/Output from previous day: 09/13 0701 - 09/14 0700 In: 220 [P.O.:120; IV Piggyback:100] Out: -  Intake/Output this shift:    General appearance: sleeping.  Lethargic. Arousable but much less communicative. Skin warm and dry. GI: abdomen obese. Soft. Somewhat distended. A little bit tender everywhere but no peritoneal signs.  Lab Results:   Recent Labs  10/28/12 1426  WBC 12.3*  HGB 11.1*  HCT 36.8  PLT 337   BMET  Recent Labs  10/28/12 1426  NA 136  K 4.4  CL 92*  CO2 32  GLUCOSE 160*  BUN 17  CREATININE 0.87  CALCIUM 8.8   PT/INR No results found for this basename: LABPROT, INR,  in the last 72 hours ABG No results found for this basename: PHART, PCO2, PO2, HCO3,  in the last 72 hours  Studies/Results: US Paracentesis  30-Oct-2012   *RADIOLOGY REPORT*  Clinical Data: metastatic adenocarcinoma, recurrent ascites  ULTRASOUND GUIDED PARACENTESIS  An ultrasound guided paracentesis was thoroughly discussed with the patient and questions answered.  The benefits, risks, alternatives and complications were also discussed.  The patient understands and wishes to proceed with the procedure.  Written consent was obtained.  Ultrasound was performed to localize and mark an adequate pocket of fluid in the right lower quadrant of the abdomen.  The area was then prepped and draped in the normal sterile fashion.  1% Lidocaine was used for local anesthesia.  Under ultrasound guidance a 19 gauge Yueh  catheter was introduced.  Paracentesis was performed.  The catheter was removed and a dressing applied.  Complications:  none  Findings:  A total of approximately 650cc of blood-tinged fluid was removed.  A fluid sample was not sent for laboratory analysis.  IMPRESSION: Successful ultrasound guided paracentesis yielding 650cc of ascites.  Read By: Pattricia Boss PA-C   Original Report Authenticated By: Malachy Moan, M.D.   Dg Chest Port 1 View  10/26/2012   CLINICAL DATA:  Shortness of breath. Diminished breath sounds at right base.  EXAM: PORTABLE CHEST - 1 VIEW  COMPARISON:  10/25/2012  FINDINGS: Bibasilar airspace opacities have increased since prior study, atelectasis or infiltrates. Heart is normal size. Mild vascular congestion. No visible effusions or edema. Advanced degenerative changes in the shoulders.  IMPRESSION: Increasing bibasilar atelectasis or infiltrates.  Mild vascular congestion.   Electronically Signed   By: Charlett Nose M.D.   On: Oct 30, 2012 18:20   Dg Abd Portable 1v  11/13/2012   *RADIOLOGY REPORT*  Clinical Data: Abdominal pain.  PORTABLE ABDOMEN - 1 VIEW  Comparison: 10/26/2012.  Findings: Dilated small bowel loops measuring up to 5.3 cm versus prior 3.6 cm.  No  progression of gas into distal small bowel loops or colon.  Findings suggestive of small bowel obstruction.  The possibility of free intraperitoneal air cannot be addressed on a supine view.  IMPRESSION: Findings raise possibility of small bowel obstruction as detailed above.  This is a call report.   Original Report  Authenticated By: Lacy Duverney, M.D.    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 Simple adhesions much less likely There is no meaningful role for surgical intervention. Low volume emesis.Would hold off on NG tube unless it becomes absolutely necessary to control symptoms. Declining mental status. Agree with focus on comfort care, symptom  control, and decisions of discharge home versus [facility].   LOS: 2 days    Kareem Cathey M. Derrell Lolling, M.D., [facility] General and Minimally invasive Surgery Breast and Colorectal Surgery Office:   [phone] Pager:   [phone]  10/29/2012

## 2012-10-29 NOTE — Progress Notes (Signed)
Chaplain made additional visits to see the patients family. The family requested assistance in reaching the congregational nurse at their church [employer]). Chaplain made phone calls and reached the pastor on call. Chaplain followed up with the patients doctor to provide the requested information.    10/29/12 1252  Clinical Encounter Type  Visited With Family  Visit Type Follow-up;Social support  Referral From Family  Spiritual Encounters  Spiritual Needs Emotional

## 2012-10-29 NOTE — Progress Notes (Signed)
Chaplain responded to family request for a chaplain. The family requested prayer. Chaplain provided ministry of presence, prayer and emotional support. Family expressed deep concern over the sudden turn in the patient's health status. Chaplain provided pastoral care and support to the family.    10/29/12 0824  Clinical Encounter Type  Visited With Patient and family together  Visit Type Follow-up;Spiritual support;Social support  Referral From [facility]  Spiritual Encounters  Spiritual Needs Prayer  Stress Factors  Family Stress Factors Health changes

## 2012-10-29 NOTE — Progress Notes (Signed)
Ms. Banbury had a dramatic rally yesterday-she ate a frosty and was asking for "lobster and pancakes" last night per her daughters. Of note she had been waiting on her 3rd daughter to arrive from out of town and she was with her most of the day yesterday. Over the night she became more agitated and developed more dyspnea-she was given morphine and ativan for her symptoms. This morning she is pale, on NRB and much weaker-she recognizes me and is able to speak in short sentences-she denies any pain but is much more SOB.   Her family is at bedside. They are preparing for her passing. I suspect given her acute decline that this could progress rapidly-she will likely have a hospital death. Her abdomen is much more distended than yesterday although she is not complaining of pain.  PRN morphine is appropriate. I have removed the NRB to allow for easier communication and comfort. Appreciate chaplain at bedside. Family grieving-will request comfort cart.  Anderson Malta, DO Palliative Medicine

## 2012-11-01 ENCOUNTER — Encounter: Payer: Self-pay | Admitting: Internal Medicine

## 2012-11-01 NOTE — Progress Notes (Unsigned)
Patient ID: [name], female   DOB: [birth year], 79 y.o.   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  Chemotherapy was stopped secondary to progression of her cancer this year and progression of malignant ascites.  On 10/13/2012 after a fall she had slurred speech and left-sided weakness and an MRI of the brain showed scattered small acute posterior right MCA territory infarcts.  She also had several tiny infarcts involving the posterior left middle cerebral artery territory.  MRA of the head was benign.  Echocardiogram revealed an ejection fraction of 55% with normal systolic function and carotid Dopplers revealed no ICA stenosis.  Patient was transferred to inpatient rehabilitation with the hope to get her home to be with her chronically ill husband.  A hemoglobin of 6.8 prompted transfusion.  She had been going for weekly therapeutic paracenteses secondary to the metastatic adenocarcinoma. While at inpatient rehabilitation she unfortunately was having intermittent bouts of vomiting and nausea and CT scan of the abdomen revealed an early partial small bowel obstruction.  General surgery was consulted who felt that the obstruction was likely secondary to her malignancy versus adhesions.  Conservative care was provided and a G-tube was considered but general surgery felt that the patient probably could not  tolerate the procedure.  This was discussed with the patient and family.  Her diet was changed to clear liquids and a decision was made for palliative care only.  Patient was transferred back to the medical service at [facility] and palliative measures were instituted with morphine and Lidoderm patches and Duragesic patch.  She had a very good day on on Saturday the 13th but had a quick decompensation that night and was poorly responsive all day on the 14th.  Comfort care was continued and she passed away at 5:22 AM on 11-27-2022.  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  Always use your most recent med list.               buPROPion 100 MG 12 hr tablet  Commonly known as:  WELLBUTRIN SR  Take 100 mg by mouth daily.     carvedilol 3.125 MG tablet  Commonly known as:  COREG  Take 3.125 mg by mouth 2 (two) times daily with a meal.     fentaNYL 12 MCG/HR  Commonly known as:  DURAGESIC - dosed mcg/hr  Place 1 patch onto the skin every 3 (three) days.     KLOR-CON M20 20 MEQ tablet  Generic drug:  potassium chloride SA  Take 10 mEq by mouth daily.     lidocaine 5 %  Commonly known as:  LIDODERM  Place 1 patch onto the skin daily. Remove & Discard patch within 12 hours or as directed by MD     ondansetron  4 MG tablet  Commonly known as:  ZOFRAN  Take 1 tablet (4 mg total) by mouth every 8 (eight) hours as needed.     polyethylene glycol packet  Commonly known as:  MIRALAX / GLYCOLAX  Take 17 g by mouth daily.     senna 8.6 MG tablet  Commonly known as:  SENOKOT  Take 1 tablet by mouth 2 (two) times daily.     traMADol 50 MG tablet  Commonly known as:  ULTRAM  Take 100 mg by mouth every 6 (six) hours as needed for pain.     venlafaxine XR 75  MG 24 hr capsule  Commonly known as:  EFFEXOR-XR  Take 75 mg by mouth daily.         The results of significant diagnostics from this hospitalization (including imaging, microbiology, ancillary and laboratory) are listed below for reference.    Significant Diagnostic Studies: Ct Abdomen Pelvis Wo Contrast  10/24/2012   *RADIOLOGY REPORT*  Clinical Data: Metastatic adenocarcinoma, malignant ascites  CT ABDOMEN AND PELVIS WITHOUT CONTRAST  Technique:  Multidetector CT imaging of the abdomen and pelvis was performed following the standard protocol without intravenous contrast.  Note:  The study was formed without IV contrast due to poor intravenous access.  Comparison: CT abdomen pelvis dated 07/01/2010.  MR abdomen dated 07/09/2010.  M r pelvis dated 05/30 1012.  Findings: Small right pleural effusion with associated right lower lobe atelectasis.  Calcified granulomata in the liver and spleen.  Unenhanced pancreas and right adrenal gland are within normal limits.  2.4 cm left adrenal nodule, unchanged, previously characterized as a benign adrenal adenoma.  Gallbladder is distended but without associated inflammatory changes. No intrahepatic or extrahepatic ductal dilatation.  Kidneys are unremarkable.  No renal calculi or hydronephrosis.  Multiple dilated loops of small bowel in the left mid abdomen, suggesting partial small bowel obstruction.  Debris within loops of mid/distal small bowel (small bowel feces sign), reflecting associated small bowel stasis.  Suspected transition point beneath the mid lower abdominal wall (series 2/image 56), at the site of prior hernia mesh repair.  Contrast from prior fluoroscopic study is present within the right colon, indicating that this is not completely obstructive. Colon is largely decompressed with extensive diverticulosis.  Moderate volume abdominopelvic ascites with associated peritoneal disease, including a 12.9 x 3.2 cm implant overlying the liver (series  2/image 24).  Atherosclerotic calcifications of the abdominal aorta and branch vessels.  4.1 x 3.8 cm lesion in the right pelvis (series 2/image 70) possibly reflecting nodal or ovarian metastasis.  Status post hysterectomy.  No adnexal masses.  Bladder is underdistended.  Postsurgical changes in the anterior abdominal wall with abnormal soft tissue/tumor along a tract in the left mid abdominal wall (series 2/images 60 and 63).  Degenerative changes of the visualized thoracolumbar spine.  IMPRESSION: Suspected early/partial small bowel obstruction with focal transition beneath the mid lower abdominal wall, at the site of prior ventral hernia mesh repair.  Moderate abdominopelvic ascites with associated peritoneal disease.  Additional tumor in the right pelvis and along the left anterior abdominal wall.   Original Report Authenticated By: Charline Bills, M.D.   Dg Chest 2 View  10/25/2012   CLINICAL DATA:  79 year old female shortness of Breath. Metastatic carcinoma.  EXAM: CHEST  2 VIEW  COMPARISON:  Abdomen CT 10/23/2012 and earlier.  FINDINGS: Portable AP semi upright view at at 1534 hrs. Mildly lower lung volumes. Stable cardiac size and mediastinal contours. No pneumothorax or pulmonary edema.  No large pleural effusion. No confluent pulmonary opacity.  IMPRESSION: Lower lung volumes, otherwise no acute cardiopulmonary abnormality.   Electronically Signed   By: Augusto Gamble M.D.   On: 10/25/2012 16:50   Dg Shoulder Right  10/13/2012   *RADIOLOGY REPORT*  Clinical Data: Right shoulder pain after fall.  RIGHT SHOULDER - 2+ VIEW  Comparison: None.  Findings: No fracture or dislocation is noted.  Visualized ribs appear normal.  Mild degenerative changes seen involving the right acromioclavicular joint.  There is severe degenerative change involving the right glenohumeral joint with deformity of right humeral head as a result.  Severe narrowing of the right acromiohumeral space is noted consistent with rotator  cuff injury.  IMPRESSION: Severe degenerative joint disease of right glenohumeral joint is noted.  No acute fracture or dislocation is noted.  Severe narrowing of right acromiohumeral space is noted consistent with rotator cuff injury.   Original Report Authenticated By: Lupita Raider.,  M.D.   Dg Abd 1 View  10/23/2012   *RADIOLOGY REPORT*  Clinical Data: Constipation, nausea, vomiting, past history of hernia repair, diabetes, appendiceal cancer  ABDOMEN - 1 VIEW  Comparison: 10/22/2012  Findings: Retained contrast in colon. Persistent mild gaseous distention of small bowel loops in the left mid abdomen compatible with a mid small bowel obstruction. No bowel wall thickening identified. Prior ventral hernia repair. Marked osseous demineralization with multilevel degenerative disc disease changes with thoracolumbar spine. Degenerative changes of the right hip.  IMPRESSION: Persistent mid small bowel obstruction.   Original Report Authenticated By: Ulyses Southward, M.D.   Dg Abd 1 View  10/22/2012   *RADIOLOGY REPORT*  Clinical Data: Nausea, vomiting and constipation.  ABDOMEN - 1 VIEW  Comparison: Abdominal radiograph 07/14/2012.  Findings: Several dilated loops of gas-filled small bowel are noted in the left side of the abdomen measuring up to 4.6 cm in diameter. There does appear to be some gas, stool and oral contrast material scattered throughout the colon including the distal rectum.  No pneumoperitoneum. Markers from a mesh repair for ventral hernia is noted.  IMPRESSION: 1.  Findings concerning for potential early or partial small bowel obstruction.  Clinical correlation is recommended.   Original Report Authenticated By: Trudie Reed, M.D.   Ct Head Wo Contrast  10/13/2012   CLINICAL DATA:  Unwitnessed fall. Left-sided weakness with slurred speech and left facial droop. Code stroke.  EXAM: CT HEAD WITHOUT CONTRAST  TECHNIQUE: Contiguous axial images were obtained from the base of the skull through the  vertex without intravenous contrast.  COMPARISON:  None.  FINDINGS: The brainstem, cerebellum, cerebral peduncles, thalamus, basal ganglia, basilar cisterns, and ventricular system appear within normal limits. Periventricular white matter and corona radiata hypodensities favor chronic ischemic microvascular white matter disease. No intracranial hemorrhage, mass lesion, or acute CVA. Left parietal scalp hematoma observed. There is chronic sphenoid sinusitis.  IMPRESSION: 1. Chronic sphenoid sinusitis. No acute intracranial findings. 2. Periventricular white matter and corona radiata hypodensities favor chronic ischemic microvascular white matter disease. 3. Left parietal scalp hematoma.  These results were called by telephone at the time of interpretation on 10/13/2012 at 9:08 AM to Dr. Thad Ranger, who verbally acknowledged these results.   Electronically Signed   By: Herbie Baltimore   On: 10/13/2012 09:13   Ct Cervical Spine Wo Contrast  10/13/2012   *RADIOLOGY REPORT*  Clinical Data: Fall  CT CERVICAL SPINE WITHOUT CONTRAST  Technique:  Multidetector CT imaging of the cervical spine was performed. Multiplanar CT image reconstructions  were also generated.  Comparison: 05/01/2007  Findings:  Axial images shows no acute fracture or subluxation. There is diffuse osteopenia.  There is no pneumothorax in visualized lung apices.  Loculated small right pleural effusion noted posteriorly.  Atherosclerotic calcifications of the thoracic aorta.  Computer processed images shows no acute fracture or subluxation. Degenerative changes are noted C1-C2 articulation.  Mild disc space flattening at C3-C4 level.  Mild to moderate disc space flattening with anterior spurring and mild posterior spurring at C4-C5 level. Significant disc space flattening with anterior spurring and vacuum disc phenomenon at C5-C6 level.  Significant disc space flattening with anterior spurring and partial bony fusion at C6-C7 level. Partial bony fusion  noted at C 7 T1 vertebral body.  Disc space flattening with anterior spurring noted at T1 T2 level.  Mild spinal canal stenosis due to posterior spurring at C5-C6 level.  No prevertebral soft tissue swelling.  Cervical airway is patent.  IMPRESSION: No acute fracture or subluxation.  Diffuse osteopenia.  Multilevel degenerative changes as described above.  Small loculated right pleural effusion.   Original Report Authenticated By: Natasha Mead, M.D.   Mr Brain Wo Contrast  10/14/2012   CLINICAL DATA:  79 year old female with fall, subsequently discovered to have left facial droop and left-sided weakness. Code stroke. History of metastatic cancer.  EXAM: MRI HEAD WITHOUT CONTRAST  MRA HEAD WITHOUT CONTRAST  TECHNIQUE: Multiplanar, multiecho pulse sequences of the brain and surrounding structures were obtained without intravenous contrast. Angiographic images of the head were obtained using MRA technique without contrast.  COMPARISON:  Head and cervical spine CT 10/13/2012.  FINDINGS: MRI HEAD FINDINGS  Gyriform restricted diffusion at the posterior right insula and posterior temporal operculum. Confluent wedge-shaped area of restricted diffusion in the poster right temporal lobe a or parietal lobe involving a 2 cm area. Scattered additional posterior right MCA territory small cortically based infarcts.  There is also evidence of several punctate or lacunar acute infarcts not in the right MCA territory: Left parietal lobe on series 8, image 32, left inferior parietal lobe on image 27, and right posterior cerebellar hemisphere on image 10. Heterogeneous diffusion in the brainstem but no definite acute brainstem infarct.  Mild associated T2 and FLAIR hyperintensity in these areas. No associated mass effect. Punctate petechial hemorrhage in the right parietal lobe (series 9, image 16. Otherwise no intracranial hemorrhage.  Major intracranial vascular flow voids are preserved although there is asymmetric increased signal  in some right posterior MCA branches on FLAIR imaging. See MRA findings below.  No midline shift, mass effect, or evidence of intracranial mass lesion. Ventricular size and configuration are within normal limits. Negative pituitary, cervicomedullary junction and visualized cervical spine. Increased fluid or mucosal thickening in the sphenoid sinus. Left posterior convexity scalp hematoma. No acute orbits soft tissue findings. Mastoids are clear.  MRA HEAD FINDINGS  Study is mildly degraded by motion artifact despite repeated imaging attempts.  Antegrade flow in the posterior circulation. Mildly dominant distal right vertebral artery. Distal vertebral artery irregularity compatible with atherosclerosis but no stenosis. Patent vertebrobasilar junction. Dominant AICA vessels. No basilar stenosis. SCA origins and left PCA origin are normal. Fetal type right PCA origin. Bilateral PCA branches are within normal limits.  Antegrade flow in both ICA siphons. ICA irregularity compatible with atherosclerosis but no subsequent stenosis. Ophthalmic and right posterior communicating artery origins are within normal limits. Left posterior communicating artery is diminutive or absent. Carotid termini, MCA and ACA origins are within normal limits. Mild motion  artifact at the level of the anterior communicating artery. Visualized ACA branches are within normal limits. Left MCA M1 segment and visualized left MCA branches are within normal limits.  Right MCA M1 segment is within normal limits. Right MCA bifurcation is patent. No right MCA major branch occlusion is identified.  IMPRESSION: MRI HEAD IMPRESSION  1. 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 No mass effect. Punctate petechial hemorrhage in the right parietal lobe.  2. Several tiny acute infarcts also in the posterior left MCA territory and also the right cerebellum. This raises the possibility of the recent embolic shower, but might instead  represent synchronous small vessel ischemia.  3. Small left scalp hematoma.  MRA HEAD IMPRESSION  Negative intracranial MRA. Mild for age intracranial atherosclerosis with no hemodynamically significant stenosis or major right MCA or circle of Willis branch occlusion identified.   Electronically Signed   By: Augusto Gamble   On: 10/14/2012 11:44   Dg Pelvis Portable  10/13/2012   *RADIOLOGY REPORT*  Clinical Data: Larey Seat.  Right hip pain.  PORTABLE PELVIS  Comparison: 07/14/2012.  Findings: Both hips are normally located.  Advanced degenerative joint disease bilaterally.  No definite acute hip fracture.  The pubic symphysis demonstrates moderate degenerative changes.  No definite pubic rami fractures.  The SI joints are intact.  Mild degenerative changes.  No definite pelvic fractures.  Advanced degenerative changes noted in the lower lumbar spine could  IMPRESSION: Degenerative changes but no definite acute hip or pelvic fracture.   Original Report Authenticated By: Rudie Meyer, M.D.   US Paracentesis  10/21/2012   *RADIOLOGY REPORT*  Clinical Data: metastatic adenocarcinoma, recurrent ascites  ULTRASOUND GUIDED PARACENTESIS  An ultrasound guided paracentesis was thoroughly discussed with the patient and questions answered.  The benefits, risks, alternatives and complications were also discussed.  The patient understands and wishes to proceed with the procedure.  Written consent was obtained.  Ultrasound was performed to localize and mark an adequate pocket of fluid in the right lower quadrant of the abdomen.  The area was then prepped and draped in the normal sterile fashion.  1% Lidocaine was used for local anesthesia.  Under ultrasound guidance a 19 gauge Yueh catheter was introduced.  Paracentesis was performed.  The catheter was removed and a dressing applied.  Complications:  none  Findings:  A total of approximately 650cc of blood-tinged fluid was removed.  A fluid sample was not sent for laboratory  analysis.  IMPRESSION: Successful ultrasound guided paracentesis yielding 650cc of ascites.  Read By: Pattricia Boss PA-C   Original Report Authenticated By: Malachy Moan, M.D.   US Paracentesis  10/20/2012   *RADIOLOGY REPORT*  Clinical Data: Metastatic adenocarcinoma, peritoneal carcinomatosis.  Recurrent ascites.  Request for therapeutic paracentesis  ULTRASOUND GUIDED PARACENTESIS  Comparison:  Previous paracentesis  An ultrasound guided paracentesis was thoroughly discussed with the patient and questions answered.  The benefits, risks, alternatives and complications were also discussed.  The patient understands and wishes to proceed with the procedure.  Written consent was obtained.  Ultrasound was performed to localize and mark an adequate pocket of fluid in the left lower quadrant of the abdomen.  The area was then prepped and draped in the normal sterile fashion.  1% Lidocaine was used for local anesthesia.  Under ultrasound guidance a 19 gauge Yueh catheter was introduced.  Paracentesis was performed.  The catheter was removed and a dressing applied.  Complications:  None  Findings:  A total of  approximately 4.2 liters of bloody ascitic fluid was removed.  A fluid sample was not sent for laboratory analysis.  IMPRESSION: Successful ultrasound guided paracentesis yielding 4.2 liters of ascites.  Read by: Brayton El, P.A,-C   Original Report Authenticated By: Richarda Overlie, M.D.   US Paracentesis  10/14/2012   *RADIOLOGY REPORT*  Clinical Data: Metastatic adenocarcinoma, peritoneal carcinomatosis, recurrent ascites, abdominal pain.  Request is made for therapeutic paracentesis.  ULTRASOUND GUIDED THERAPEUTIC  PARACENTESIS  An ultrasound guided paracentesis was thoroughly discussed with the patient and questions answered.  The benefits, risks, alternatives and complications were also discussed.  The patient understands and wishes to proceed with the procedure.  Written consent was obtained.  Ultrasound  was performed to localize and mark an adequate pocket of fluid in the right lower quadrant of the abdomen.  The area was then prepped and draped in the normal sterile fashion.  1% Lidocaine was used for local anesthesia.  Under ultrasound guidance a 19 gauge Yueh catheter was introduced.  Paracentesis was performed.  The catheter was removed and a dressing applied.  Complications:  none  Findings:  A total of approximately 3.9 liters of bloody fluid was removed.  IMPRESSION: Successful ultrasound guided therapeutic paracentesis yielding 3.9 liters of ascites.  Read by: Jeananne Rama, P.A.-C   Original Report Authenticated By: Judie Petit. Miles Costain, M.D.   US Paracentesis  10/06/2012   *RADIOLOGY REPORT*  Clinical Data: Ascites  ULTRASOUND GUIDED PARACENTESIS  An ultrasound guided paracentesis was thoroughly discussed with the patient and questions answered.  The benefits, risks, alternatives and complications were also discussed.  The patient understands and wishes to proceed with the procedure.  Written consent was obtained.  Ultrasound was performed to localize and mark an adequate pocket of fluid in the right lower quadrant of the abdomen.  The area was then prepped and draped in the normal sterile fashion.  1% Lidocaine was used for local anesthesia.  Under ultrasound guidance a 19 gauge Yueh catheter was introduced.  Paracentesis was performed.  The catheter was removed and a dressing applied.  Complications:  none  Findings:  A total of approximately 3.8 liters of blood tinged fluid was removed.  A fluid sample was not sent for laboratory analysis.  IMPRESSION: Successful ultrasound guided paracentesis yielding 3.8 liters of ascites.  Read By: Pattricia Boss PA-C   Original Report Authenticated By: Richarda Overlie, M.D.   Dg Chest Port 1 View  10/20/2012   CLINICAL DATA:  Shortness of breath. Diminished breath sounds at right base.  EXAM: PORTABLE CHEST - 1 VIEW  COMPARISON:  10/25/2012  FINDINGS: Bibasilar airspace  opacities have increased since prior study, atelectasis or infiltrates. Heart is normal size. Mild vascular congestion. No visible effusions or edema. Advanced degenerative changes in the shoulders.  IMPRESSION: Increasing bibasilar atelectasis or infiltrates.  Mild vascular congestion.   Electronically Signed   By: Charlett Nose M.D.   On: 10/16/2012 18:20   Dg Chest Portable 1 View  10/13/2012   *RADIOLOGY REPORT*  Clinical Data: Fall, stroke  PORTABLE CHEST - 1 VIEW  Comparison: 07/14/2012  Findings: Cardiomediastinal silhouette is stable.  Mild emphysematous changes.  No pulmonary edema.  There is hazy right base medially atelectasis or infiltrate.  Extensive degenerative changes bilateral shoulders.  IMPRESSION:  Mild emphysematous changes.  No pulmonary edema.  There is hazy right base medially atelectasis or infiltrate.  Extensive degenerative changes bilateral shoulders.   Original Report Authenticated By: Natasha Mead, M.D.   Dg  Abd 2 Views  10/25/2012   *RADIOLOGY REPORT*  Clinical Data: Follow-up small bowel obstruction.  Abdominal pain.  ABDOMEN - 2 VIEW  Comparison: CT, 10/23/2012  Findings: Small bowel dilation has decreased when compared to the prior CT, although there is still small bowel dilation and air- fluid levels.  This suggests an improved, but not resolved, partial small bowel obstruction.  There is no free air  Contrast mixed with stool is seen throughout a nondistended colon.  IMPRESSION: Improved, but unresolved, partial small bowel obstruction.   Original Report Authenticated By: Amie Portland, M.D.   Dg Abd Portable 1v  11/03/2012   *RADIOLOGY REPORT*  Clinical Data: Abdominal pain.  PORTABLE ABDOMEN - 1 VIEW  Comparison: 10/26/2012.  Findings: Dilated small bowel loops measuring up to 5.3 cm versus prior 3.6 cm.  No  progression of gas into distal small bowel loops or colon.  Findings suggestive of small bowel obstruction.  The possibility of free intraperitoneal air cannot be  addressed on a supine view.  IMPRESSION: Findings raise possibility of small bowel obstruction as detailed above.  This is a call report.   Original Report Authenticated By: Lacy Duverney, M.D.   Dg Abd Portable 1v  10/26/2012   CLINICAL DATA:  Small bowel obstruction  EXAM: PORTABLE ABDOMEN - 1 VIEW  COMPARISON:  Radiograph 10/25/2012, 10/23/2012 CT  FINDINGS: There are mildly dilated loops of small bowel predominantly in the left abdomen measuring up to 3.6 cm which is slightly decreased from 4 cm on prior. There is gas in the rectum. There is interval partial clearing of stool from the colon. .  IMPRESSION: Mild improvement in partial small bowel obstruction.   Electronically Signed   By: Genevive Bi M.D.   On: 10/26/2012 08:58   Dg Swallowing Func-speech Pathology  10/14/2012   Lacinda Axon, CCC-SLP     10/14/2012  5:55 PM Objective Swallowing Evaluation: Modified Barium Swallowing Study   Patient Details  Name: ALLECIA BELLS MRN: [id number] Date of Birth: March 10, 1922  Today's Date: 10/14/2012 Time: 0865-7846 SLP Time Calculation (min): 33 min  Past Medical History:  Past Medical History  Diagnosis Date  . Cataract   . Diabetes mellitus   . Cancer    Past Surgical History:  Past Surgical History  Procedure Laterality Date  . Bladder surgery  06/1989  . Knee surgery  10/30/1993    Lt   . Back surgery  20/11/1998    ruptured disc  . Colon surgery    . Hernia repair     HPI:  KHAILEE MICK is a 79 y.o. female with MPH of Metastatic  adenocarcinoma -carcinomatosis Dx- 2012 (was previous on  chemo-xeloda, pantimumamab, but stopped due to progression of  cancer/hospice), malignant ascites on weekly paracentesis, DM,  HTN, OSA, depression, chronic cancer related pain who woke up   early this morning with pain. Later she got up to use the  bathroom she had some difficulty then fell. When her aide found  her she was on the floor she had a left facial droop and was weak  on the left side. EMS was called and the  patient was brought in  as a code stroke. Initial NIHSS of 8.  MBS ordered following  results of BSE.     Assessment / Plan / Recommendation Clinical Impression  Dysphagia Diagnosis: Moderate oral phase dysphagia;Mild  pharyngeal phase dysphagia;Moderate cervical esophageal phase  dysphagia Moderate sensory motor oral dysphagia marked by labial and  lingual,  buccal weakness on left with decreased lingual  coordination.  Xerostomia noted.  Slow oral transit with  piecemeal swallows with all consistencies.  Moderate sensory  motor pharyngeal dysphagia marked by delay in initiation,  decreased TBR, decreased laryngeal elevation,  and  reduced  pharyngeal peristalsis.  Deep penetration during swallow of thin  liquid barium by straw with delayed throat clear.  Patient unable  to clear penetrates with instructed cough.  Modified  Cup sips  effective in eliminating further penetration.  Minimal residue in  vallecular space, posterior pharyngeal wall and pyriforms with  puree and mechanical soft consistencies but cleared with  spontaneous second swallow. Brief esophageal sweep indicates   moderate to severe cervical esophageal dysphagia. No radiologist  present to confirm.   Diffuse stasis with noted backflow to  cervical portion with soft solids. Whole barium tablet retained  in thoracic portion requiring sips of thin liquid and dry  swallows to continue bolus transit.  Recommend to initiate  dysphagia 2 diet consistency with thin liquids by cup sips only  with total assist with each meal to provide cues as needed.   Recommend aspiration and strict reflux precautions as aspiration  risk remains high s/p swallow.  Recommend to crush medication as  able  and administer in puree consistency.  Diagnostic treatment  completed s/p evaluation focusing on providing diet  recommendations and strategies to improve safety to family  members, patient, and caregivers.  Patient may benefit from GI  consult to further assess esophageal  functioning.  ST to follow  closely in acute care setting to monitor for aspiration and for  diet tolerance.  Defer diet advancement to treating SLP with  clinical improvement.         Diet Recommendation Dysphagia 2 (Fine chop);Thin liquid   Liquid Administration via: Cup;No straw Medication Administration: Crushed with puree Supervision: Full supervision/cueing for compensatory  strategies;Staff feed patient Compensations: Slow rate;Small sips/bites;Check for  pocketing;Multiple dry swallows after each bite/sip Postural Changes and/or Swallow Maneuvers: Seated upright 90  degrees;Upright 30-60 min after meal    Other  Recommendations Recommended Consults: Consider GI  evaluation Oral Care Recommendations: Oral care before and after PO Other Recommendations: Clarify dietary restrictions   Follow Up Recommendations  Inpatient Rehab    Frequency and Duration min 2x/week  2 weeks       SLP Swallow Goals Patient will consume recommended diet without observed clinical  signs of aspiration with: Minimal assistance Patient will utilize recommended strategies during swallow to  increase swallowing safety with: Minimal assistance   General Date of Onset: 10/13/12 HPI: MEAGEN LIMONES is a 79 y.o. female with MPH of Metastatic  adenocarcinoma -carcinomatosis Dx- 2012 (was previous on  chemo-xeloda, pantimumamab, but stopped due to progression of  cancer/hospice), malignant ascites on weekly paracentesis, DM,  HTN, OSA, depression, chronic cancer related pain who woke up   early this morning with pain. Later she got up to use the  bathroom she had some difficulty then fell. When her aide found  her she was on the floor she had a left facial droop and was weak  on the left side. EMS was called and the patient was brought in  as a code stroke. Initial NIHSS of 8. Type of Study: Modified Barium Swallowing Study Reason for Referral: Objectively evaluate swallowing function Previous Swallow Assessment: BSE 10/14/12 NPO  Diet Prior  to this Study: NPO Temperature Spikes Noted: No Respiratory Status: Supplemental O2 delivered via (comment)  (nasal cannula )  History of Recent Intubation: No Behavior/Cognition: Alert;Cooperative;Pleasant mood Oral Cavity - Dentition: Adequate natural dentition Oral Motor / Sensory Function: Impaired - see Bedside swallow  eval Self-Feeding Abilities: Needs assist Patient Positioning: Upright in chair Baseline Vocal Quality: Breathy;Low vocal intensity Volitional Cough: Strong Volitional Swallow: Able to elicit Anatomy: Within functional limits Pharyngeal Secretions: Not observed secondary MBS    Reason for Referral Objectively evaluate swallowing function   Oral Phase Oral Preparation/Oral Phase Oral Phase: Impaired Oral - Nectar Oral - Nectar Teaspoon: Weak lingual manipulation;Incomplete  tongue to palate contact;Reduced posterior propulsion;Piecemeal  swallowing;Lingual/palatal residue;Delayed oral transit Oral - Nectar Cup: Weak lingual manipulation;Incomplete tongue to  palate contact;Reduced posterior propulsion;Lingual/palatal  residue;Piecemeal swallowing;Delayed oral transit Oral - Nectar Straw: Weak lingual manipulation;Lingual/palatal  residue;Piecemeal swallowing Oral - Thin Oral - Thin Teaspoon: Lingual pumping;Incomplete tongue to palate  contact;Reduced posterior propulsion;Lingual/palatal  residue;Piecemeal swallowing;Delayed oral transit Oral - Thin Cup: Weak lingual manipulation;Reduced posterior  propulsion;Incomplete tongue to palate contact;Lingual/palatal  residue;Delayed oral transit Oral - Thin Straw: Weak lingual manipulation;Reduced posterior  propulsion;Lingual/palatal residue;Piecemeal swallowing;Delayed  oral transit Oral - Solids Oral - Puree: Weak lingual manipulation;Lingual pumping;Reduced  posterior propulsion;Lingual/palatal residue;Piecemeal  swallowing;Delayed oral transit Oral - Mechanical Soft: Impaired mastication;Reduced posterior  propulsion;Piecemeal  swallowing;Lingual/palatal residue;Delayed  oral transit;Weak lingual manipulation   Pharyngeal Phase Pharyngeal Phase Pharyngeal Phase: Impaired Pharyngeal - Nectar Pharyngeal - Nectar Teaspoon: Premature spillage to  valleculae;Reduced tongue base retraction;Reduced laryngeal  elevation;Pharyngeal residue - valleculae;Reduced pharyngeal  peristalsis Pharyngeal - Nectar Cup: Premature spillage to  valleculae;Premature spillage to pyriform sinuses;Delayed swallow  initiation;Reduced pharyngeal peristalsis;Reduced laryngeal  elevation;Reduced tongue base retraction;Pharyngeal residue -  posterior pharnyx;Pharyngeal residue - pyriform sinuses Pharyngeal - Nectar Straw: Premature spillage to pyriform  sinuses;Reduced pharyngeal peristalsis;Delayed swallow  initiation;Reduced tongue base retraction;Reduced laryngeal  elevation;Pharyngeal residue - posterior pharnyx;Pharyngeal  residue - pyriform sinuses;Pharyngeal residue - valleculae Pharyngeal - Thin Pharyngeal - Thin Teaspoon: Premature spillage to  valleculae;Premature spillage to pyriform sinuses;Delayed swallow  initiation;Reduced pharyngeal peristalsis;Reduced tongue base  retraction;Pharyngeal residue - valleculae;Pharyngeal residue -  posterior pharnyx;Pharyngeal residue - pyriform sinuses Pharyngeal - Thin Cup: Premature spillage to pyriform  sinuses;Delayed swallow initiation;Reduced pharyngeal  peristalsis;Reduced laryngeal elevation;Reduced tongue base  retraction Pharyngeal - Thin Straw: Premature spillage to pyriform  sinuses;Reduced pharyngeal peristalsis;Delayed swallow  initiation;Reduced tongue base retraction;Penetration/Aspiration  during swallow;Pharyngeal residue - valleculae;Pharyngeal residue  - pyriform sinuses Penetration/Aspiration details (thin straw): Material enters  airway, CONTACTS cords and not ejected out Pharyngeal - Solids Pharyngeal - Puree: Premature spillage to pyriform  sinuses;Reduced pharyngeal peristalsis;Delayed swallow   initiation;Reduced laryngeal elevation;Reduced tongue base  retraction;Pharyngeal residue - valleculae;Pharyngeal residue -  posterior pharnyx;Pharyngeal residue - pyriform sinuses Pharyngeal - Mechanical Soft: Premature spillage to pyriform  sinuses;Reduced pharyngeal peristalsis;Delayed swallow  initiation;Reduced laryngeal elevation;Reduced tongue base  retraction;Pharyngeal residue - valleculae;Pharyngeal residue -  posterior pharnyx;Pharyngeal residue - pyriform sinuses Pharyngeal - Pill: Premature spillage to valleculae  Cervical Esophageal Phase    GO    Cervical Esophageal Phase Cervical Esophageal Phase: Impaired Cervical Esophageal Phase - Solids Puree: Prominent cricopharyngeal segment;Esophageal backflow into  cervical esophagus Mechanical Soft: Esophageal backflow into cervical  esophagus;Prominent cricopharyngeal segment Pill: Esophageal backflow into cervical esophagus;Prominent  cricopharyngeal segment        Moreen Fowler MS, CCC-SLP [phone] [facility] 10/14/2012, 5:37 PM    Mr Maxine Glenn Head/brain Wo Cm  10/14/2012   CLINICAL DATA:  79 year old female with fall, subsequently discovered to have left facial droop and left-sided weakness. Code stroke. History of metastatic cancer.  EXAM:  MRI HEAD WITHOUT CONTRAST  MRA HEAD WITHOUT CONTRAST  TECHNIQUE: Multiplanar, multiecho pulse sequences of the brain and surrounding structures were obtained without intravenous contrast. Angiographic images of the head were obtained using MRA technique without contrast.  COMPARISON:  Head and cervical spine CT 10/13/2012.  FINDINGS: MRI HEAD FINDINGS  Gyriform restricted diffusion at the posterior right insula and posterior temporal operculum. Confluent wedge-shaped area of restricted diffusion in the poster right temporal lobe a or parietal lobe involving a 2 cm area. Scattered additional posterior right MCA territory small cortically based infarcts.  There is also evidence of several punctate or lacunar acute  infarcts not in the right MCA territory: Left parietal lobe on series 8, image 32, left inferior parietal lobe on image 27, and right posterior cerebellar hemisphere on image 10. Heterogeneous diffusion in the brainstem but no definite acute brainstem infarct.  Mild associated T2 and FLAIR hyperintensity in these areas. No associated mass effect. Punctate petechial hemorrhage in the right parietal lobe (series 9, image 16. Otherwise no intracranial hemorrhage.  Major intracranial vascular flow voids are preserved although there is asymmetric increased signal in some right posterior MCA branches on FLAIR imaging. See MRA findings below.  No midline shift, mass effect, or evidence of intracranial mass lesion. Ventricular size and configuration are within normal limits. Negative pituitary, cervicomedullary junction and visualized cervical spine. Increased fluid or mucosal thickening in the sphenoid sinus. Left posterior convexity scalp hematoma. No acute orbits soft tissue findings. Mastoids are clear.  MRA HEAD FINDINGS  Study is mildly degraded by motion artifact despite repeated imaging attempts.  Antegrade flow in the posterior circulation. Mildly dominant distal right vertebral artery. Distal vertebral artery irregularity compatible with atherosclerosis but no stenosis. Patent vertebrobasilar junction. Dominant AICA vessels. No basilar stenosis. SCA origins and left PCA origin are normal. Fetal type right PCA origin. Bilateral PCA branches are within normal limits.  Antegrade flow in both ICA siphons. ICA irregularity compatible with atherosclerosis but no subsequent stenosis. Ophthalmic and right posterior communicating artery origins are within normal limits. Left posterior communicating artery is diminutive or absent. Carotid termini, MCA and ACA origins are within normal limits. Mild motion artifact at the level of the anterior communicating artery. Visualized ACA branches are within normal limits. Left MCA M1  segment and visualized left MCA branches are within normal limits.  Right MCA M1 segment is within normal limits. Right MCA bifurcation is patent. No right MCA major branch occlusion is identified.  IMPRESSION: MRI HEAD IMPRESSION  1. 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 No mass effect. Punctate petechial hemorrhage in the right parietal lobe.  2. Several tiny acute infarcts also in the posterior left MCA territory and also the right cerebellum. This raises the possibility of the recent embolic shower, but might instead represent synchronous small vessel ischemia.  3. Small left scalp hematoma.  MRA HEAD IMPRESSION  Negative intracranial MRA. Mild for age intracranial atherosclerosis with no hemodynamically significant stenosis or major right MCA or circle of Willis branch occlusion identified.   Electronically Signed   By: Augusto Gamble   On: 10/14/2012 11:44    Microbiology: No results found for this or any previous visit (from the past 240 hour(s)).   Labs: Results for orders placed during the hospital encounter of Nov 20, 2012  BASIC METABOLIC PANEL      Result Value Range   Sodium 136  135 - 145 mEq/L   Potassium 4.4  3.5 - 5.1 mEq/L  Chloride 92 (*) 96 - 112 mEq/L   CO2 32  19 - 32 mEq/L   Glucose, Bld 160 (*) 70 - 99 mg/dL   BUN 17  6 - 23 mg/dL   Creatinine, Ser 4.09  0.50 - 1.10 mg/dL   Calcium 8.8  8.4 - 81.1 mg/dL   GFR calc non Af Amer 58 (*) >90 mL/min   GFR calc Af Amer 67 (*) >90 mL/min  CBC      Result Value Range   WBC 12.3 (*) 4.0 - 10.5 K/uL   RBC 4.73  3.87 - 5.11 MIL/uL   Hemoglobin 11.1 (*) 12.0 - 15.0 g/dL   HCT 91.4  78.2 - 95.6 %   MCV 77.8 (*) 78.0 - 100.0 fL   MCH 23.5 (*) 26.0 - 34.0 pg   MCHC 30.2  30.0 - 36.0 g/dL   RDW 21.3 (*) 08.6 - 57.8 %   Platelets 337  150 - 400 K/uL    Time coordinating discharge: 20 minutes  Signed: Pearla Dubonnet, MD 11/01/2012, 6:54 AM

## 2012-11-02 ENCOUNTER — Ambulatory Visit: Payer: Medicare Other | Admitting: Oncology

## 2012-11-15 NOTE — Progress Notes (Signed)
RN called to room, patient expired, no respirations, no BP.  Patient was pronounced by Tyler Pita, RN and Gean Maidens, RN.  [facility] notified.  Spoke with Magda Kiel [phone].  Family wishes patient to be picked up at bedside.  Bed control notified.  To contact [employer] [facility] [employer].  Prayer shawl sent with patient to funeral home.

## 2012-11-15 DEATH — deceased

## 2012-11-22 ENCOUNTER — Ambulatory Visit: Admitting: Interventional Cardiology

## 2013-10-10 ENCOUNTER — Other Ambulatory Visit: Payer: Self-pay | Admitting: Pharmacist

## 2014-08-30 IMAGING — CR DG CHEST 2V
2 series · 2 of 2 positions shown · non-contrast
Comparison: PA and lateral chest 06/21/2012 and 08/06/2010.

CLINICAL DATA: Cough.

CHEST - 2 VIEW

[w chest pa]
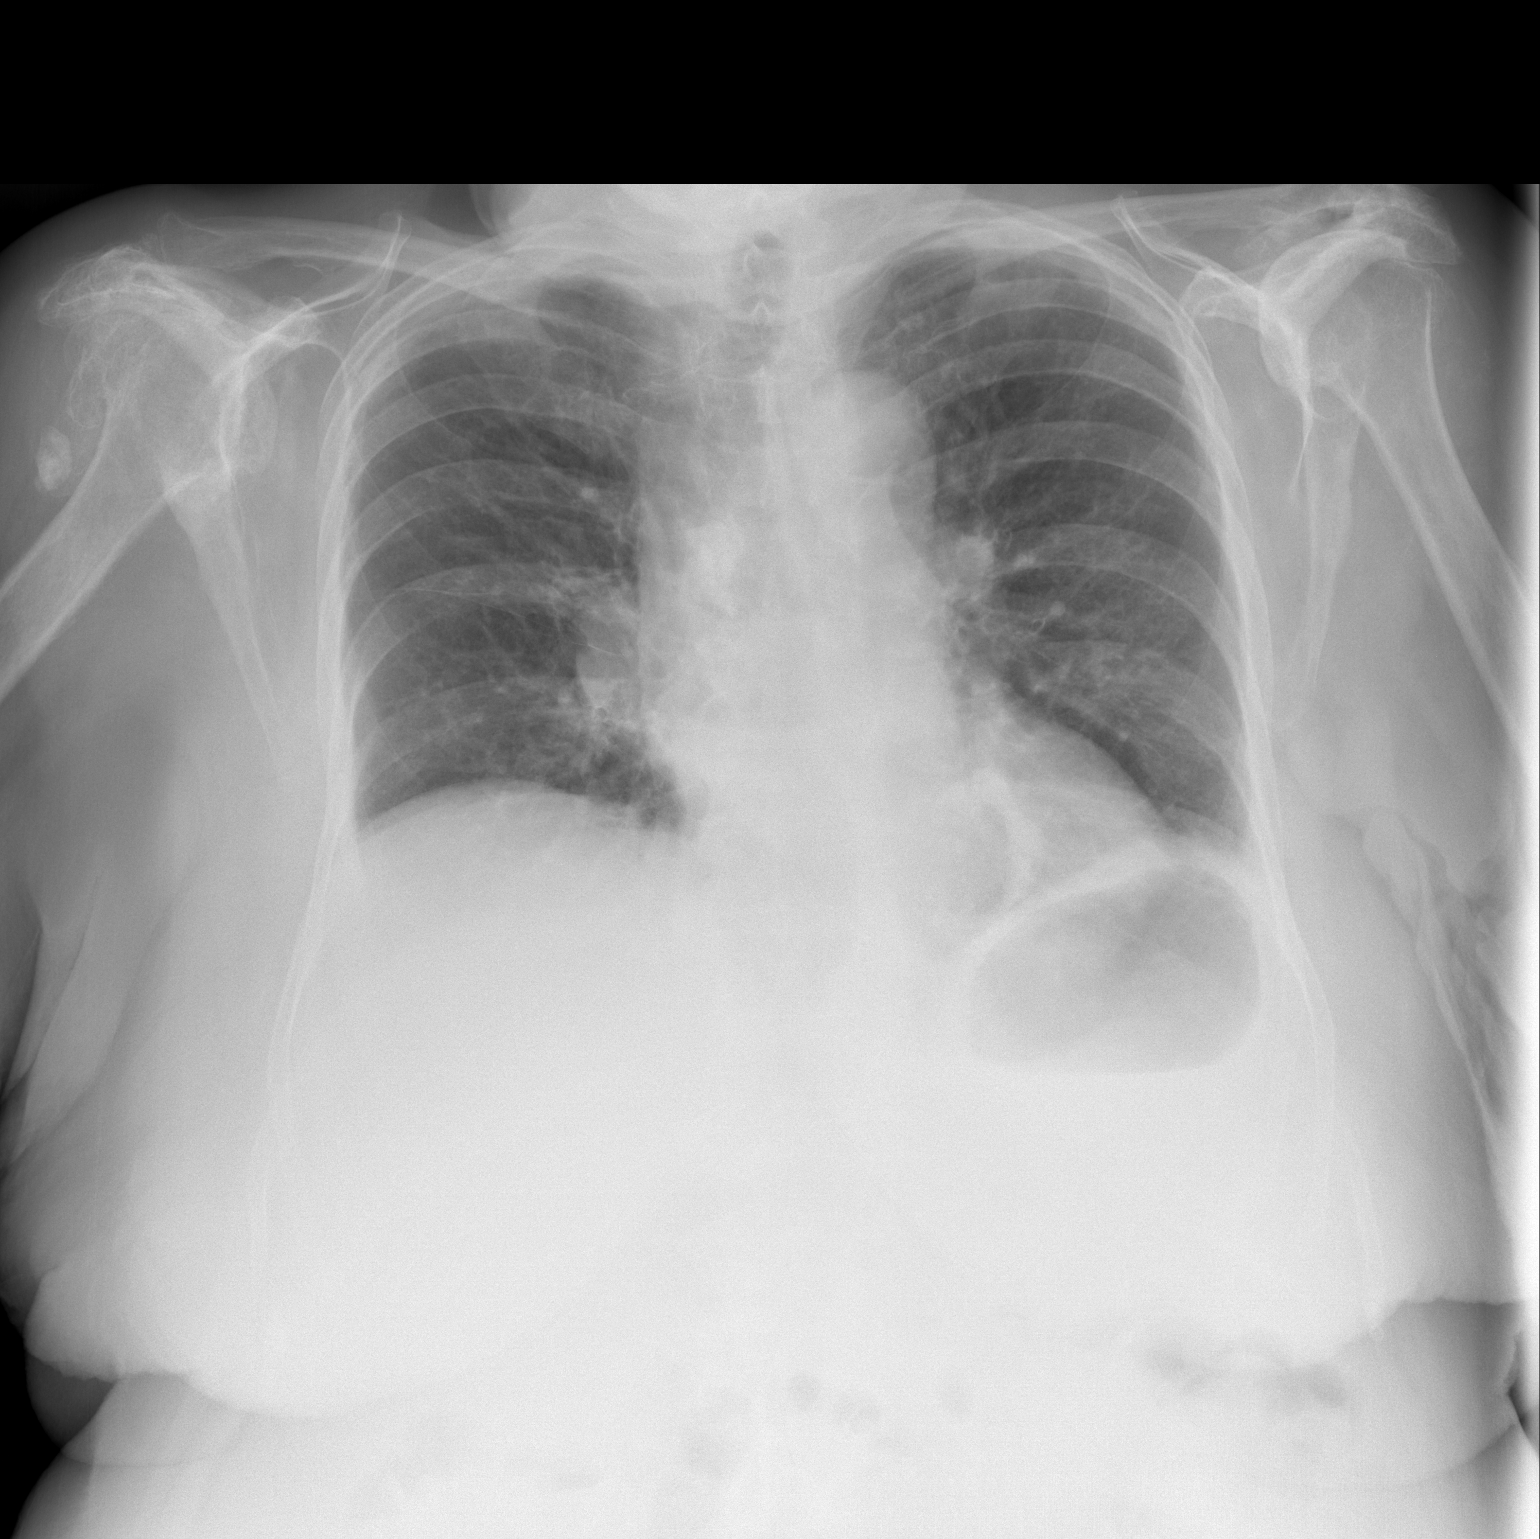

[w chest lat]
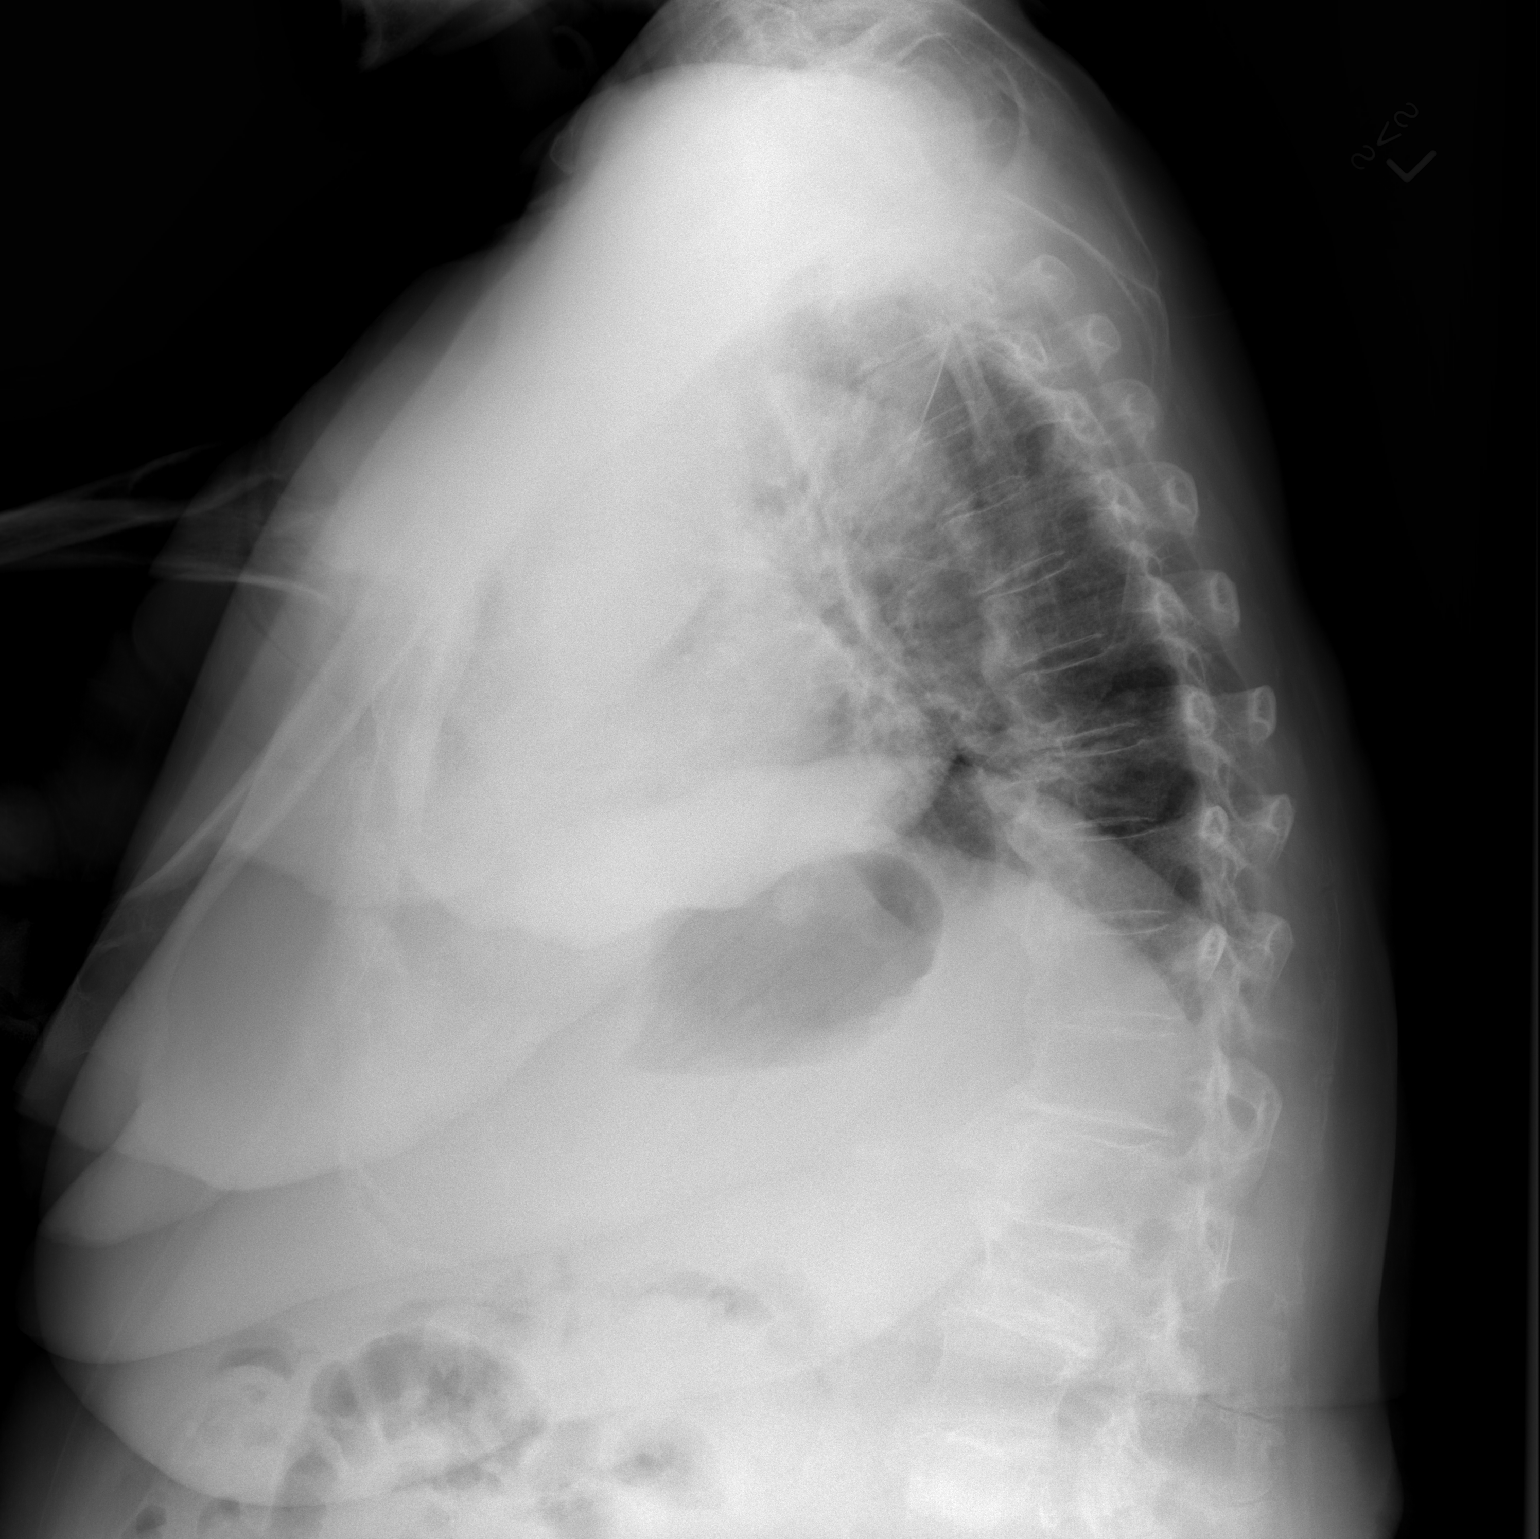

[2 of 2 positions shown; findings below may reference images not displayed]

FINDINGS: Lung volumes are low but the lungs are clear.  Heart size
is normal.  No pneumothorax or pleural fluid.  Severe degenerative
disease about the shoulders is noted.
IMPRESSION: No acute abnormality.

## 2014-08-30 IMAGING — US US PARACENTESIS
1 series · 6 of 6 positions shown · non-contrast
Comparison: None

CLINICAL DATA: Abdominal distension and discomfort.  Ascites.
History of metastatic adenocarcinoma.

ULTRASOUND GUIDED PARACENTESIS

[Series 1: us paracentesis · 0.32mm/px · 6 of 6 slices shown]
[im 1/6]
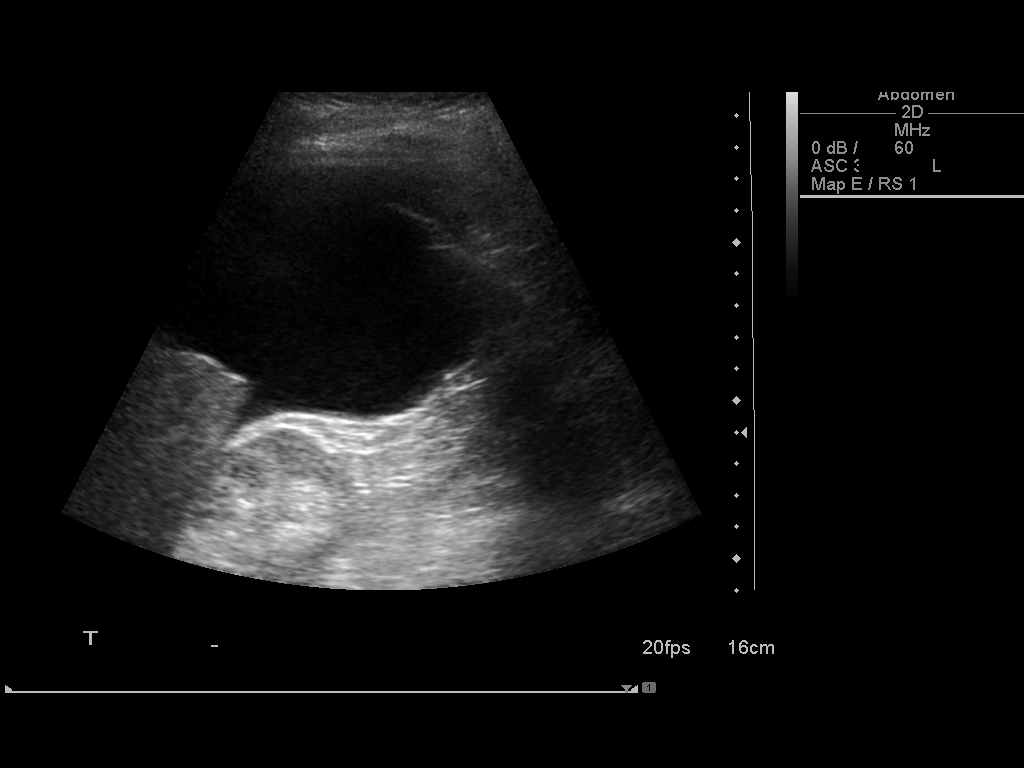
[im 2/6]
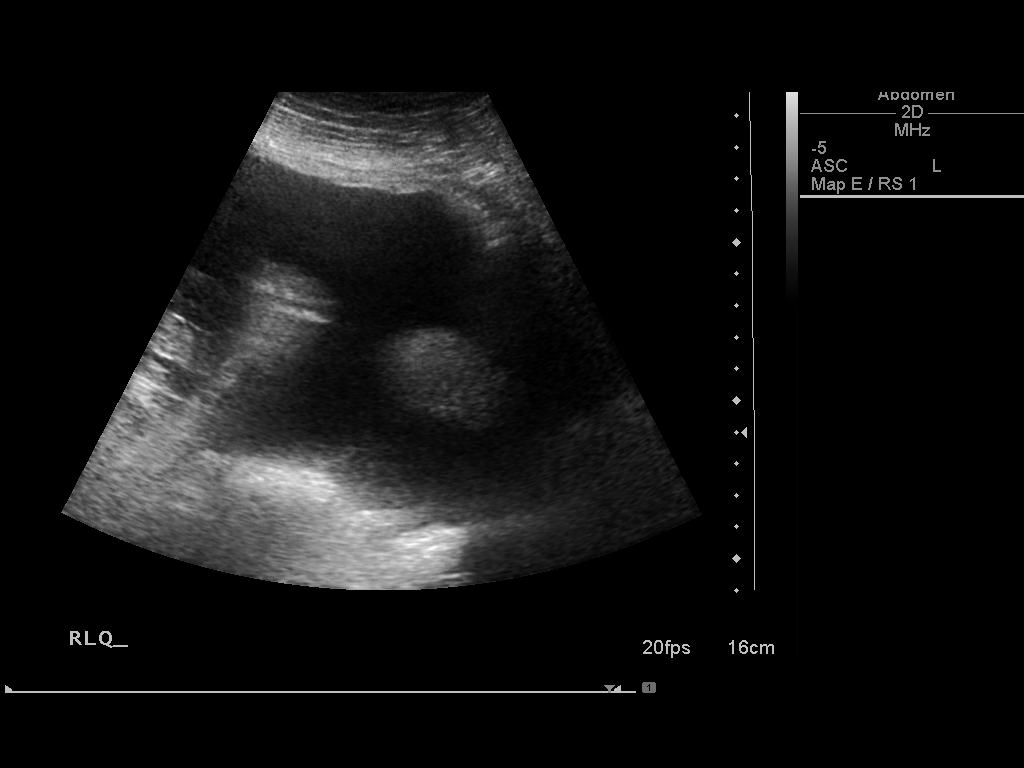
[im 3/6]
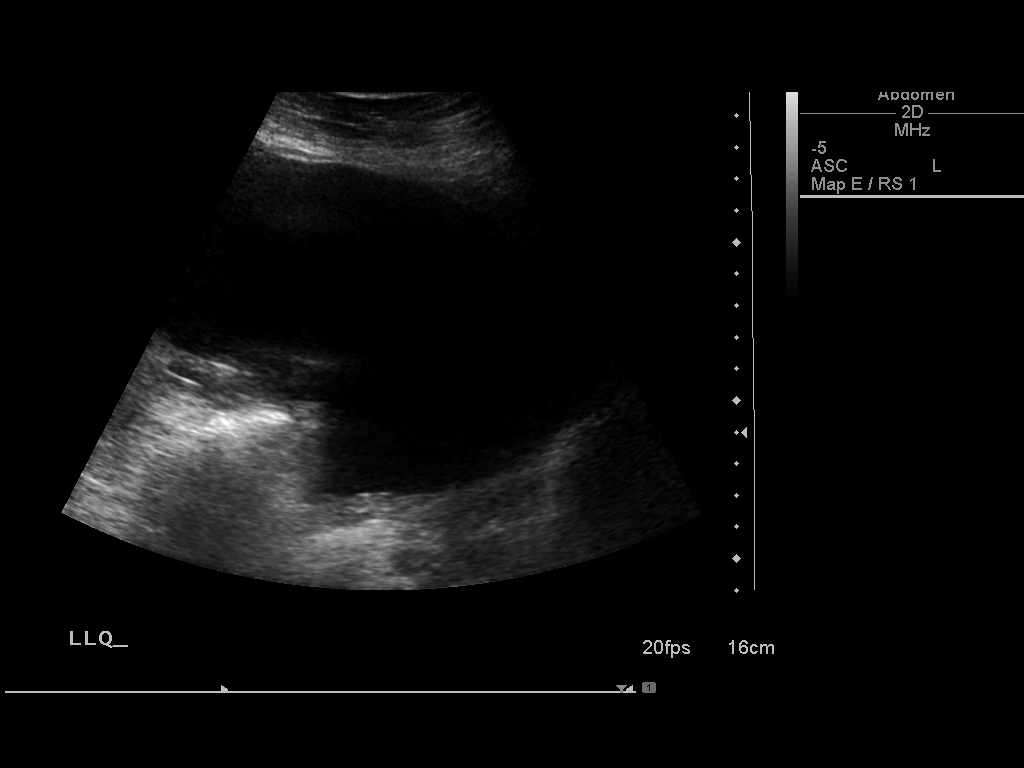
[im 4/6]
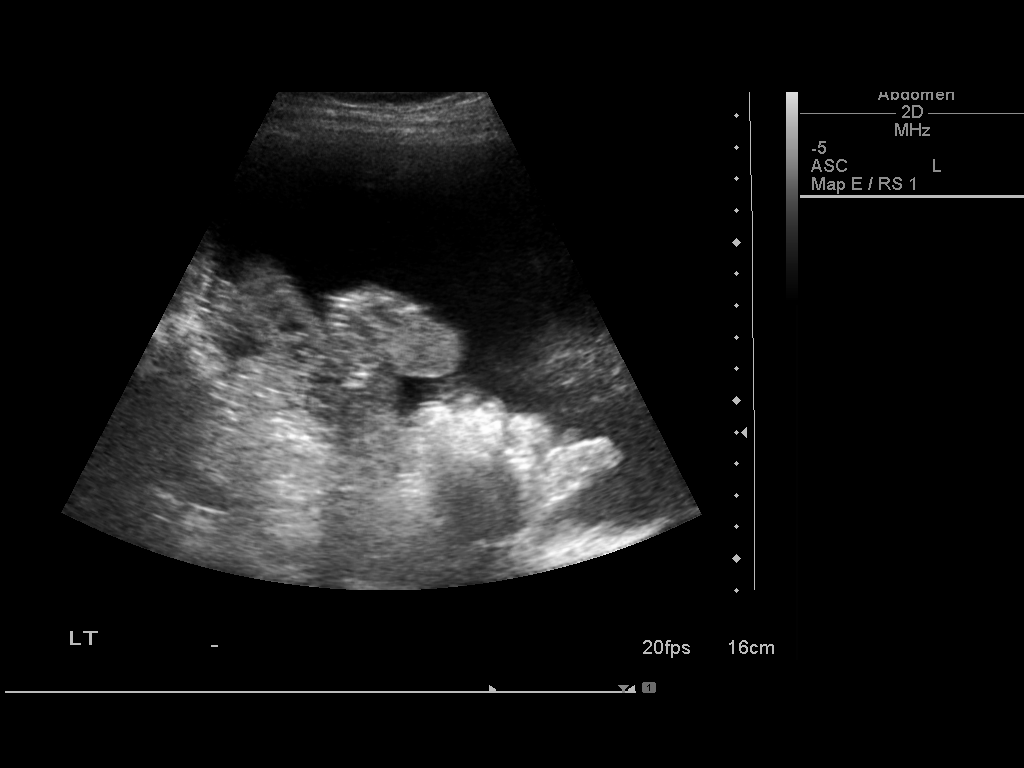
[im 5/6]
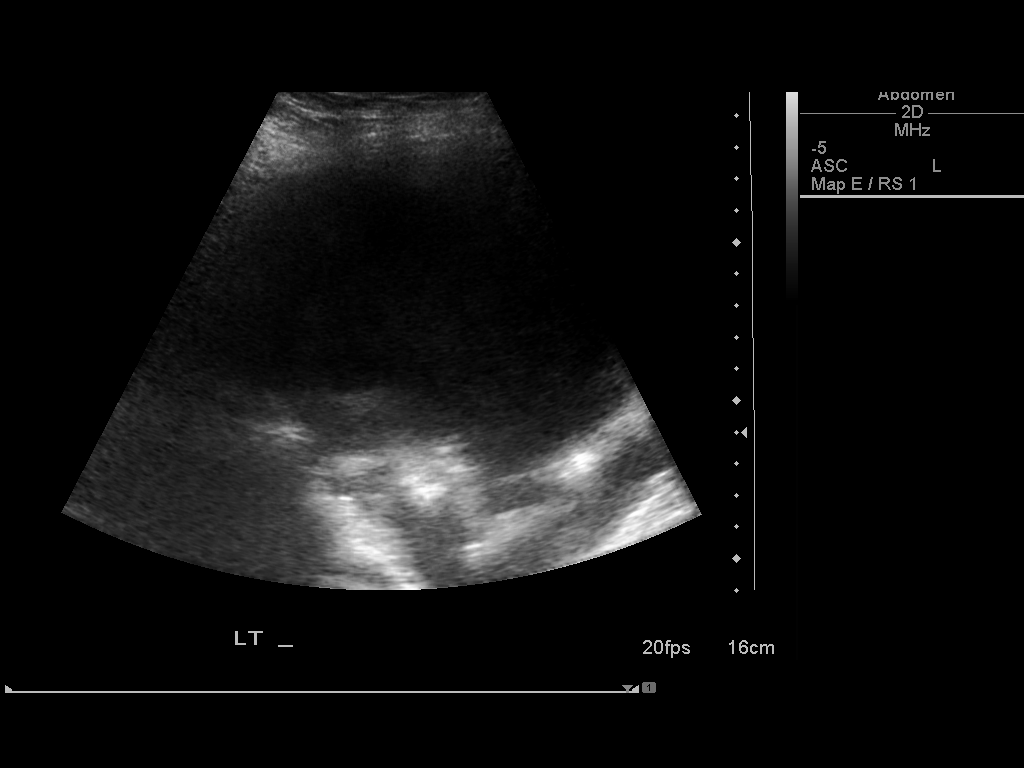
[im 6/6]
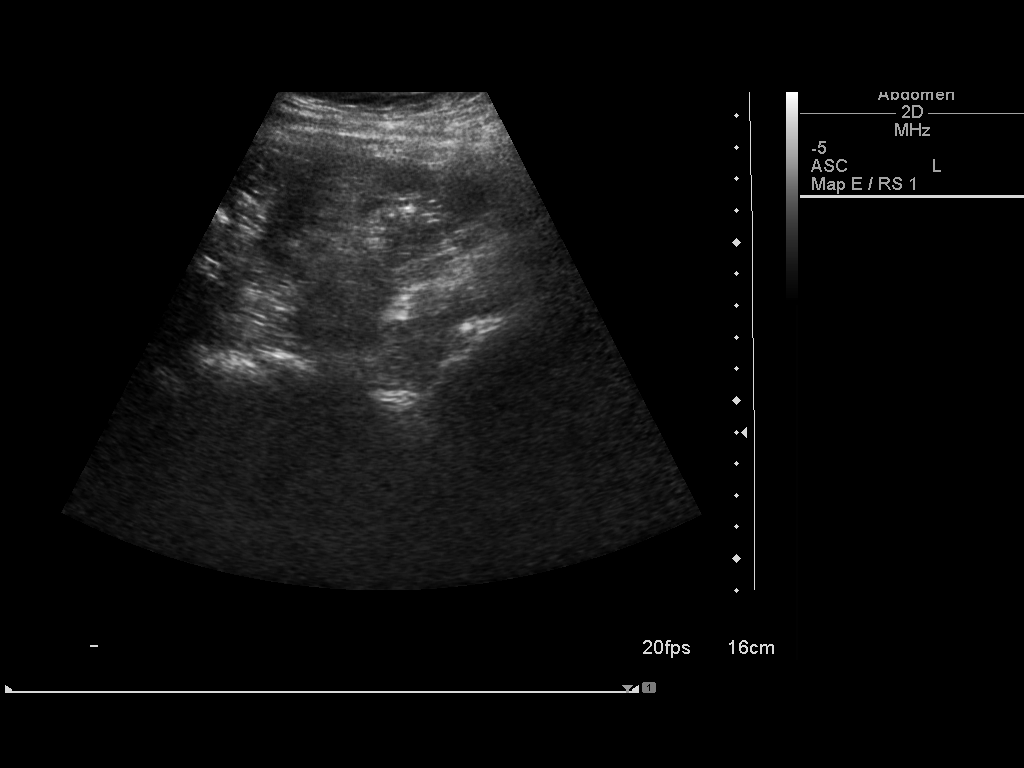

[6 of 6 positions shown; findings below may reference images not displayed]

An ultrasound guided paracentesis was thoroughly discussed with the
patient and questions answered.  The benefits, risks, alternatives
and complications were also discussed.  The patient understands and
wishes to proceed with the procedure.  Written consent was
obtained.

Ultrasound was performed to localize and mark an adequate pocket of
fluid in the left lower quadrant of the abdomen.  The area was then
prepped and draped in the normal sterile fashion.  1% Lidocaine was
used for local anesthesia.  Under ultrasound guidance a 19 gauge
Yueh catheter was introduced.  Paracentesis was performed.  The
catheter was removed and a dressing applied.

Complications:  None
FINDINGS: As this is the patient's first paracentesis, a max total
of approximately 6 liters of bloody ascitic fluid was removed.  A
fluid sample was not sent for laboratory analysis.
IMPRESSION: Successful ultrasound guided paracentesis yielding 6 liters of
ascites.

Read by Lizette Illescas

## 2014-11-29 IMAGING — CR DG CHEST 1V PORT
1 series · 1 of 1 positions shown · non-contrast
Comparison: 07/14/2012

CLINICAL DATA: Fall, stroke

PORTABLE CHEST - 1 VIEW

[AP]
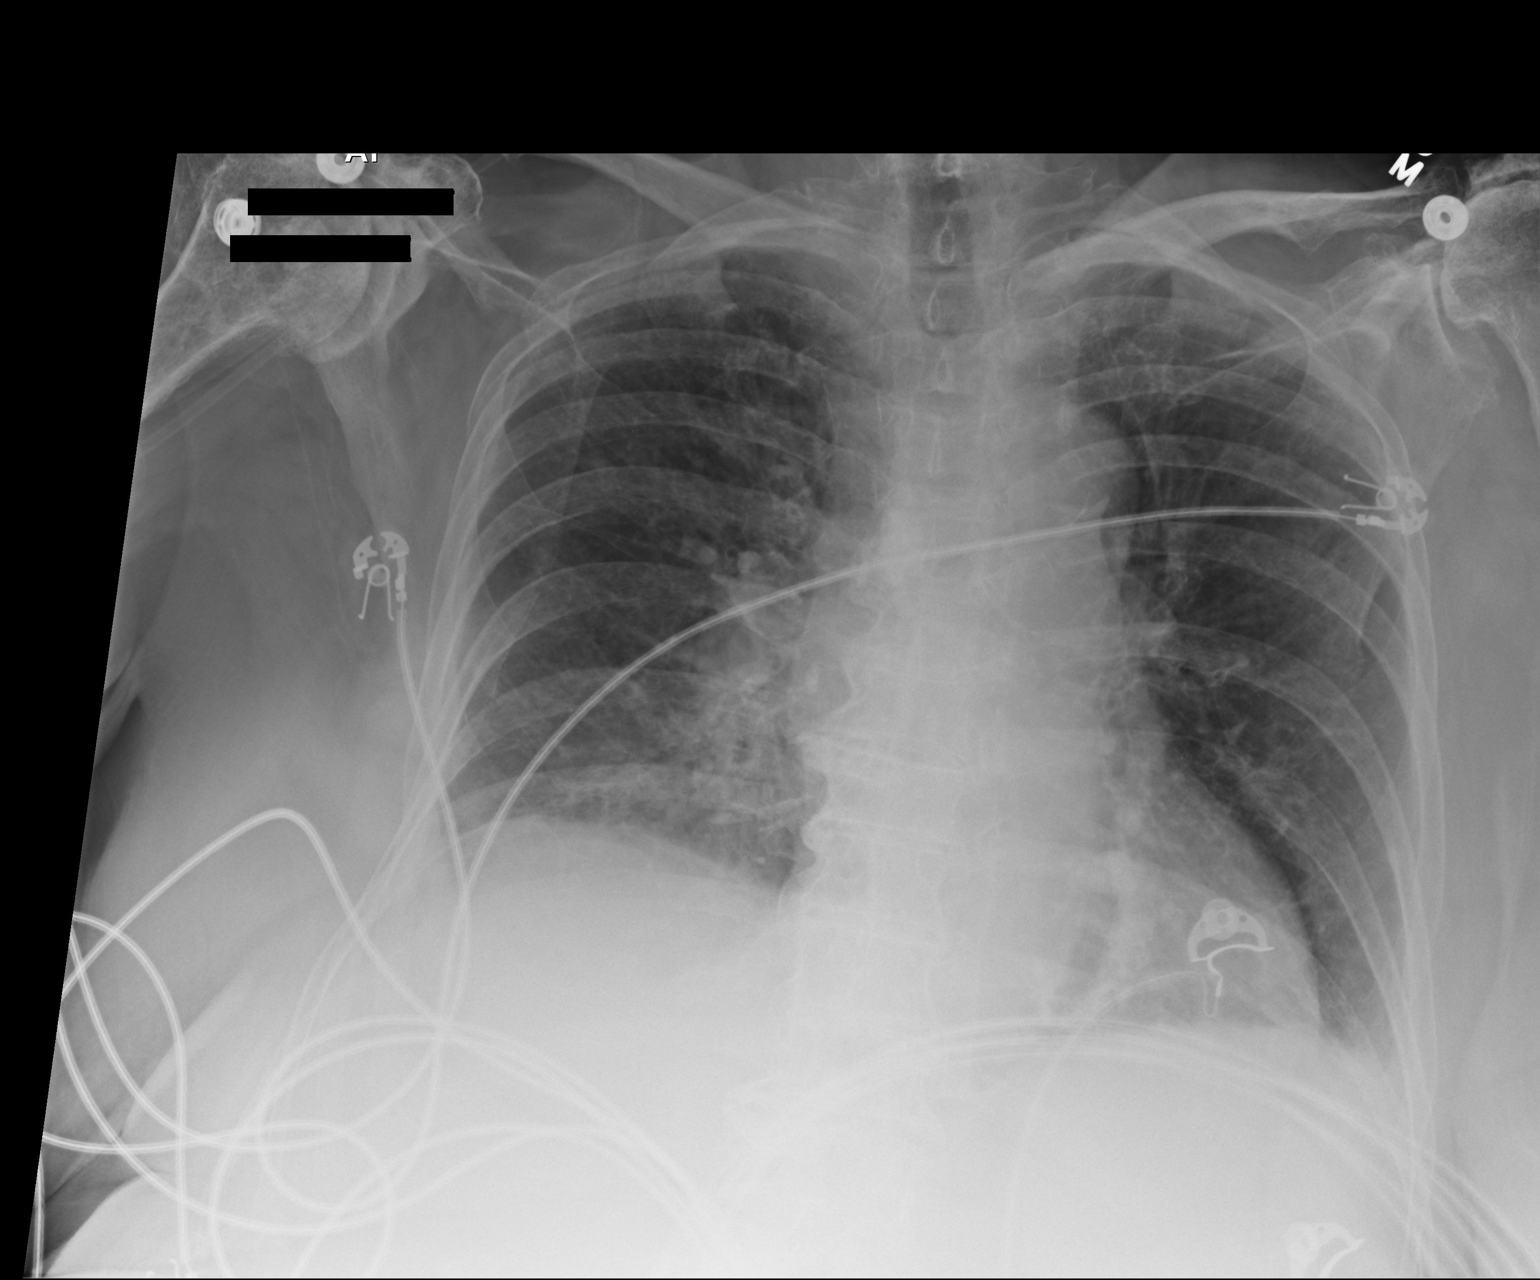

[1 of 1 positions shown; findings below may reference images not displayed]

FINDINGS: Cardiomediastinal silhouette is stable.  Mild
emphysematous changes.  No pulmonary edema.  There is hazy right
base medially atelectasis or infiltrate.  Extensive degenerative
changes bilateral shoulders.
IMPRESSION: Mild emphysematous changes.  No pulmonary edema.  There is hazy
right base medially atelectasis or infiltrate.  Extensive
degenerative changes bilateral shoulders.]

## 2014-11-29 IMAGING — CT CT CERVICAL SPINE W/O CM
4 series · 16 of 35 positions shown, 19 images · non-contrast
Comparison: 05/01/2007

CLINICAL DATA: Fall

CT CERVICAL SPINE WITHOUT CONTRAST
TECHNIQUE: Multidetector CT imaging of the cervical spine was
performed. Multiplanar CT image reconstructions were also
generated.

[Series 3: soft tissue · axial · 0.33mm/px · z∈[+110,+170]mm · 3 of 90 slices shown]
[im 15/90  soft-tissue]
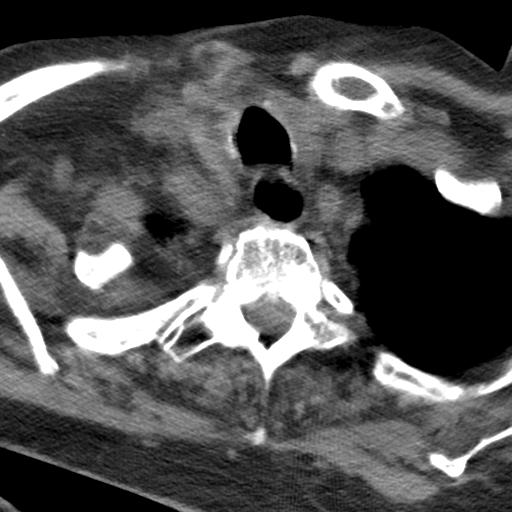
[im 30/90  soft-tissue]
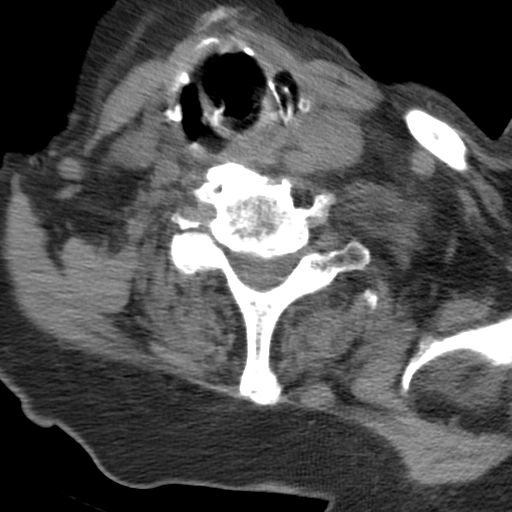
[im 45/90  soft-tissue]
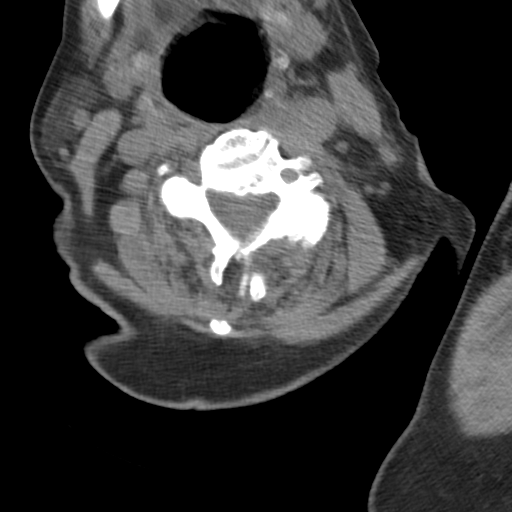

[cor · coronal · 0.35mm/px · 3 of 36 slices shown]
[im 8/36  bone]
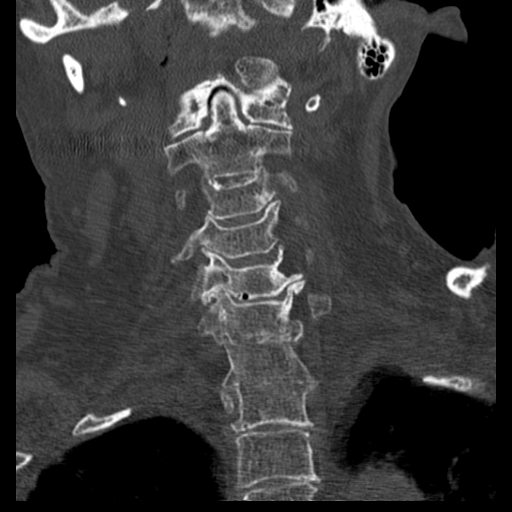
[im 15/36  bone]
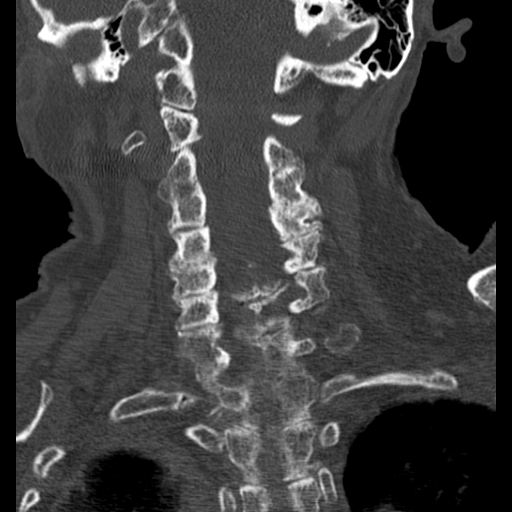
[im 22/36  bone]
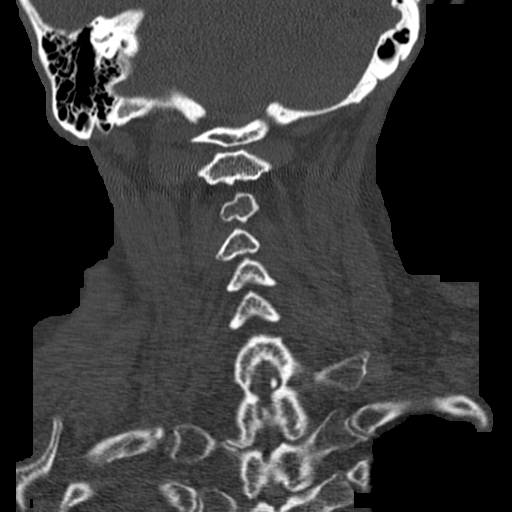

[sag · sagittal · 0.35mm/px · 5 of 41 slices shown, 6 images]
[im 14/41  bone]
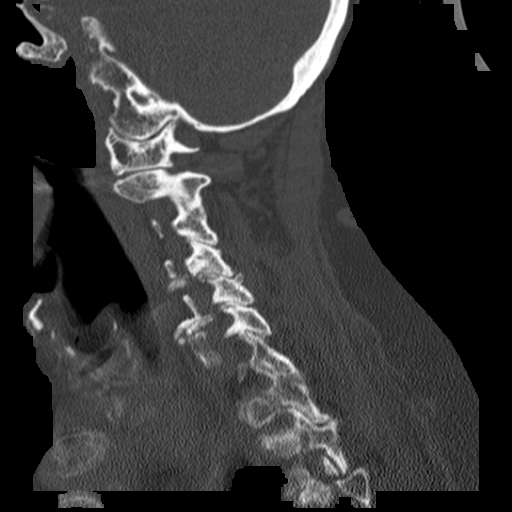
[im 17/41  bone]
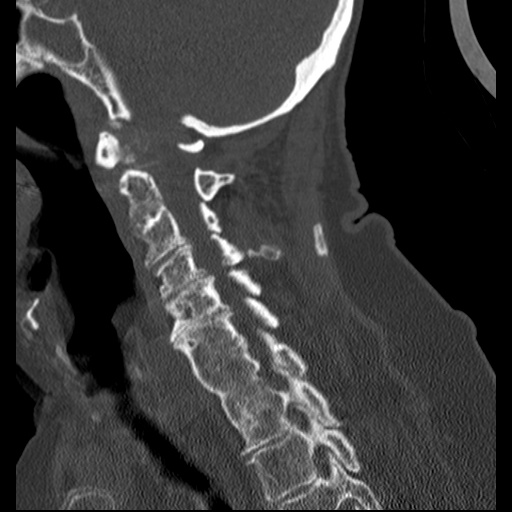
[im 21/41  soft-tissue]
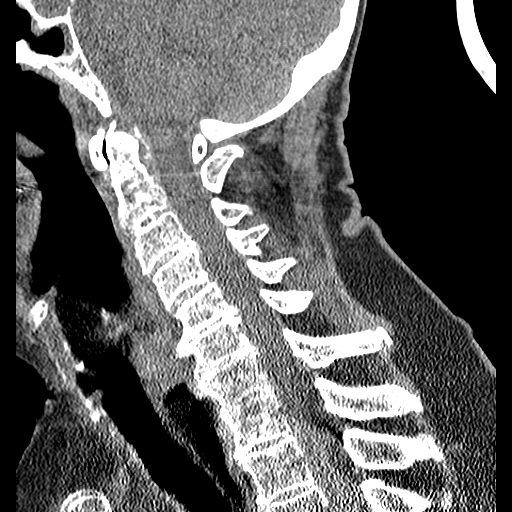
[im 21/41  bone]
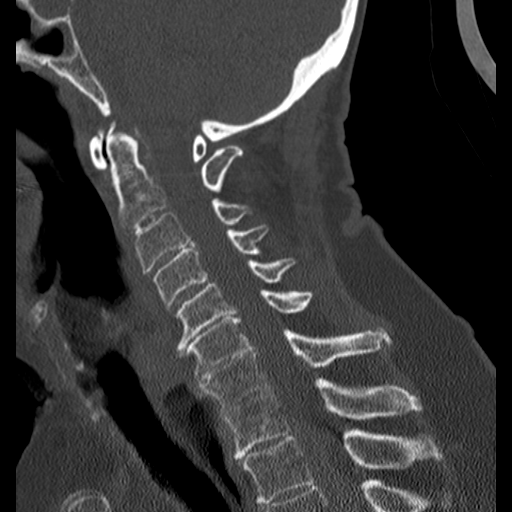
[im 24/41  bone]
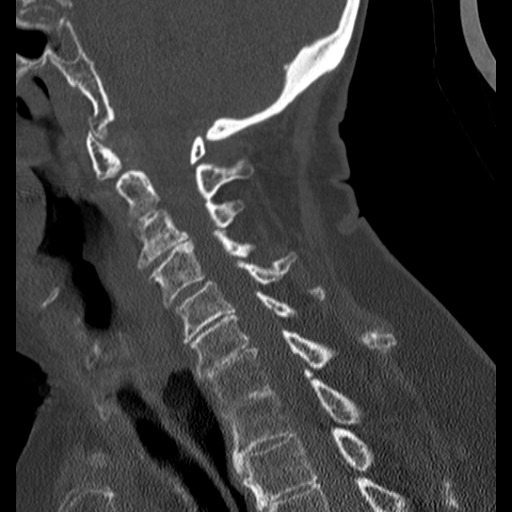
[im 27/41  bone]
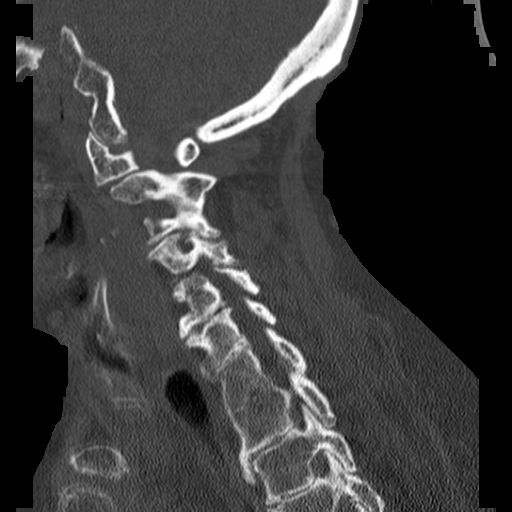

[orthogs · axial · 0.32mm/px · z∈[+84,+183]mm · 5 of 81 slices shown, 7 images]
[im 14/81  soft-tissue]
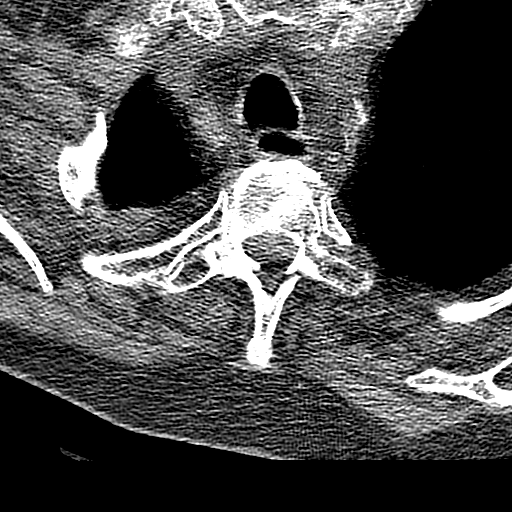
[im 14/81  bone]
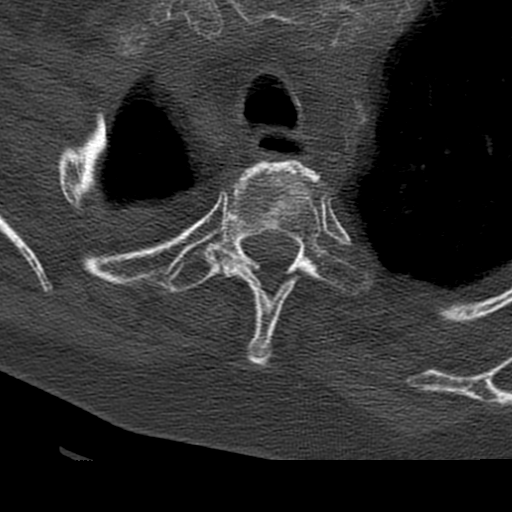
[im 27/81  bone]
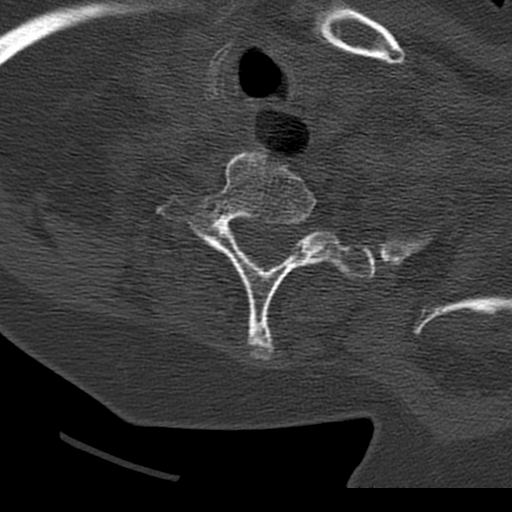
[im 41/81  bone]
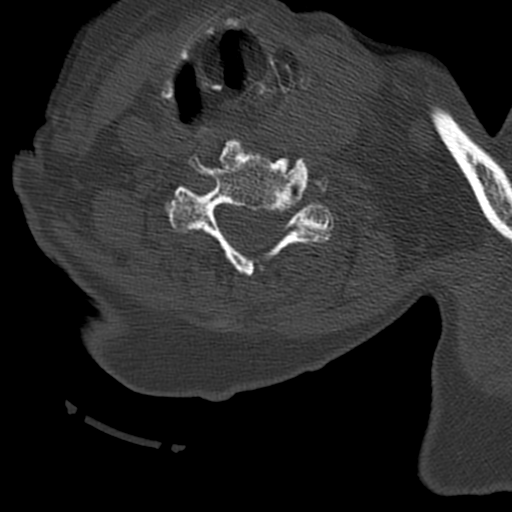
[im 54/81  bone]
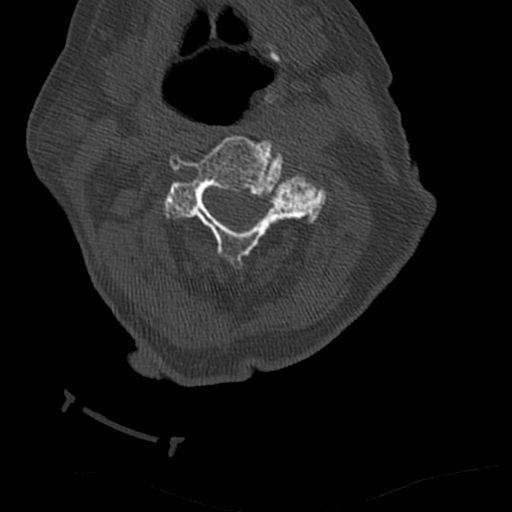
[im 67/81  soft-tissue]
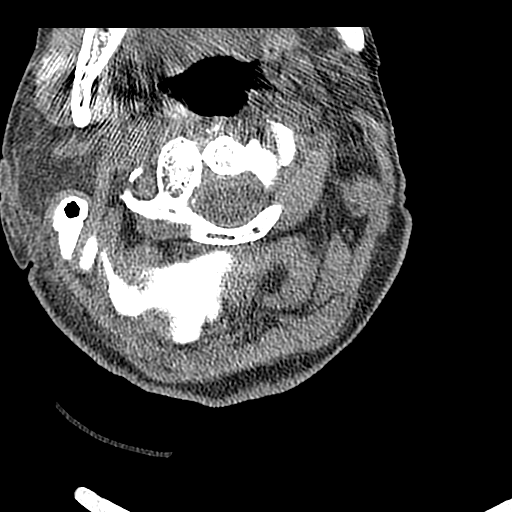
[im 67/81  bone]
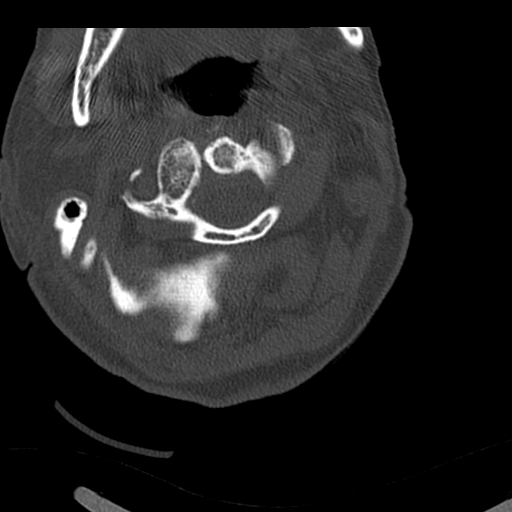

[16 of 35 positions shown; findings below may reference images not displayed]

FINDINGS: Axial images shows no acute fracture or subluxation.
There is diffuse osteopenia.  There is no pneumothorax in
visualized lung apices.  Loculated small right pleural effusion
noted posteriorly.  Atherosclerotic calcifications of the thoracic
aorta.

Computer processed images shows no acute fracture or subluxation.
Degenerative changes are noted C1-C2 articulation.  Mild disc space
flattening at C3-C4 level.  Mild to moderate disc space flattening
with anterior spurring and mild posterior spurring at C4-C5 level.
Significant disc space flattening with anterior spurring and vacuum
disc phenomenon at C5-C6 level.  Significant disc space flattening
with anterior spurring and partial bony fusion at C6-C7 level.
Partial bony fusion noted at C 7 T1 vertebral body..  Disc space
flattening with anterior spurring noted at T1 T2 level.  Mild
spinal canal stenosis due to posterior spurring at C5-C6 level.  No
prevertebral soft tissue swelling.  Cervical airway is patent.
IMPRESSION: No acute fracture or subluxation.  Diffuse osteopenia.  Multilevel
degenerative changes as described above.  Small loculated right
pleural effusion.
